# Patient Record
Sex: Male | Born: 1950 | Race: White | Hispanic: No | Marital: Married | Smoking: Former smoker
Health system: Southern US, Community
[De-identification: ages and names within clinical notes are randomized; demographics above are authoritative.]

## PROBLEM LIST (undated history)

## (undated) DIAGNOSIS — E785 Hyperlipidemia, unspecified: Secondary | ICD-10-CM

## (undated) DIAGNOSIS — Z9289 Personal history of other medical treatment: Secondary | ICD-10-CM

## (undated) DIAGNOSIS — E349 Endocrine disorder, unspecified: Secondary | ICD-10-CM

## (undated) DIAGNOSIS — I2510 Atherosclerotic heart disease of native coronary artery without angina pectoris: Secondary | ICD-10-CM

## (undated) DIAGNOSIS — I10 Essential (primary) hypertension: Secondary | ICD-10-CM

## (undated) HISTORY — DX: Hyperlipidemia, unspecified: E78.5

## (undated) HISTORY — DX: Atherosclerotic heart disease of native coronary artery without angina pectoris: I25.10

## (undated) HISTORY — DX: Endocrine disorder, unspecified: E34.9

## (undated) HISTORY — PX: CARDIAC SURGERY: SHX584

## (undated) HISTORY — DX: Personal history of other medical treatment: Z92.89

## (undated) HISTORY — DX: Essential (primary) hypertension: I10

## (undated) HISTORY — PX: HERNIA REPAIR: SHX51

---

## 2006-08-14 ENCOUNTER — Ambulatory Visit: Payer: Self-pay | Admitting: Family Medicine

## 2006-08-18 ENCOUNTER — Ambulatory Visit: Payer: Self-pay | Admitting: Family Medicine

## 2006-08-18 LAB — CONVERTED CEMR LAB
ALT: 21 units/L (ref 0–40)
AST: 20 units/L (ref 0–37)
BUN: 14 mg/dL (ref 6–23)
CO2: 32 meq/L (ref 19–32)
Calcium: 9 mg/dL (ref 8.4–10.5)
Chloride: 104 meq/L (ref 96–112)
Chol/HDL Ratio, serum: 7.1
Cholesterol: 285 mg/dL (ref 0–200)
Creatinine, Ser: 1.2 mg/dL (ref 0.4–1.5)
GFR calc non Af Amer: 67 mL/min
Glomerular Filtration Rate, Af Am: 81 mL/min/{1.73_m2}
Glucose, Bld: 101 mg/dL — ABNORMAL HIGH (ref 70–99)
HDL: 40.1 mg/dL (ref >39.0)
LDL DIRECT: 208.4 mg/dL
Potassium: 4.4 meq/L (ref 3.5–5.1)
Sodium: 142 meq/L (ref 135–145)
Triglyceride fasting, serum: 199 mg/dL — ABNORMAL HIGH (ref 0–149)
VLDL: 40 mg/dL (ref 0–40)

## 2006-09-29 ENCOUNTER — Ambulatory Visit (HOSPITAL_COMMUNITY): Admission: RE | Admit: 2006-09-29 | Discharge: 2006-09-29 | Payer: Self-pay | Admitting: General Surgery

## 2006-10-13 HISTORY — PX: CORONARY ARTERY BYPASS GRAFT: SHX141

## 2006-12-17 ENCOUNTER — Ambulatory Visit: Payer: Self-pay | Admitting: Family Medicine

## 2007-01-25 IMAGING — CR DG CHEST 2V
2 series · 2 of 2 positions shown · non-contrast
Comparison: none

CLINICAL DATA: Right inguinal hernia; preoperative chest.
 CHEST - 2 VIEW:

[view not recorded (1 of 2)]
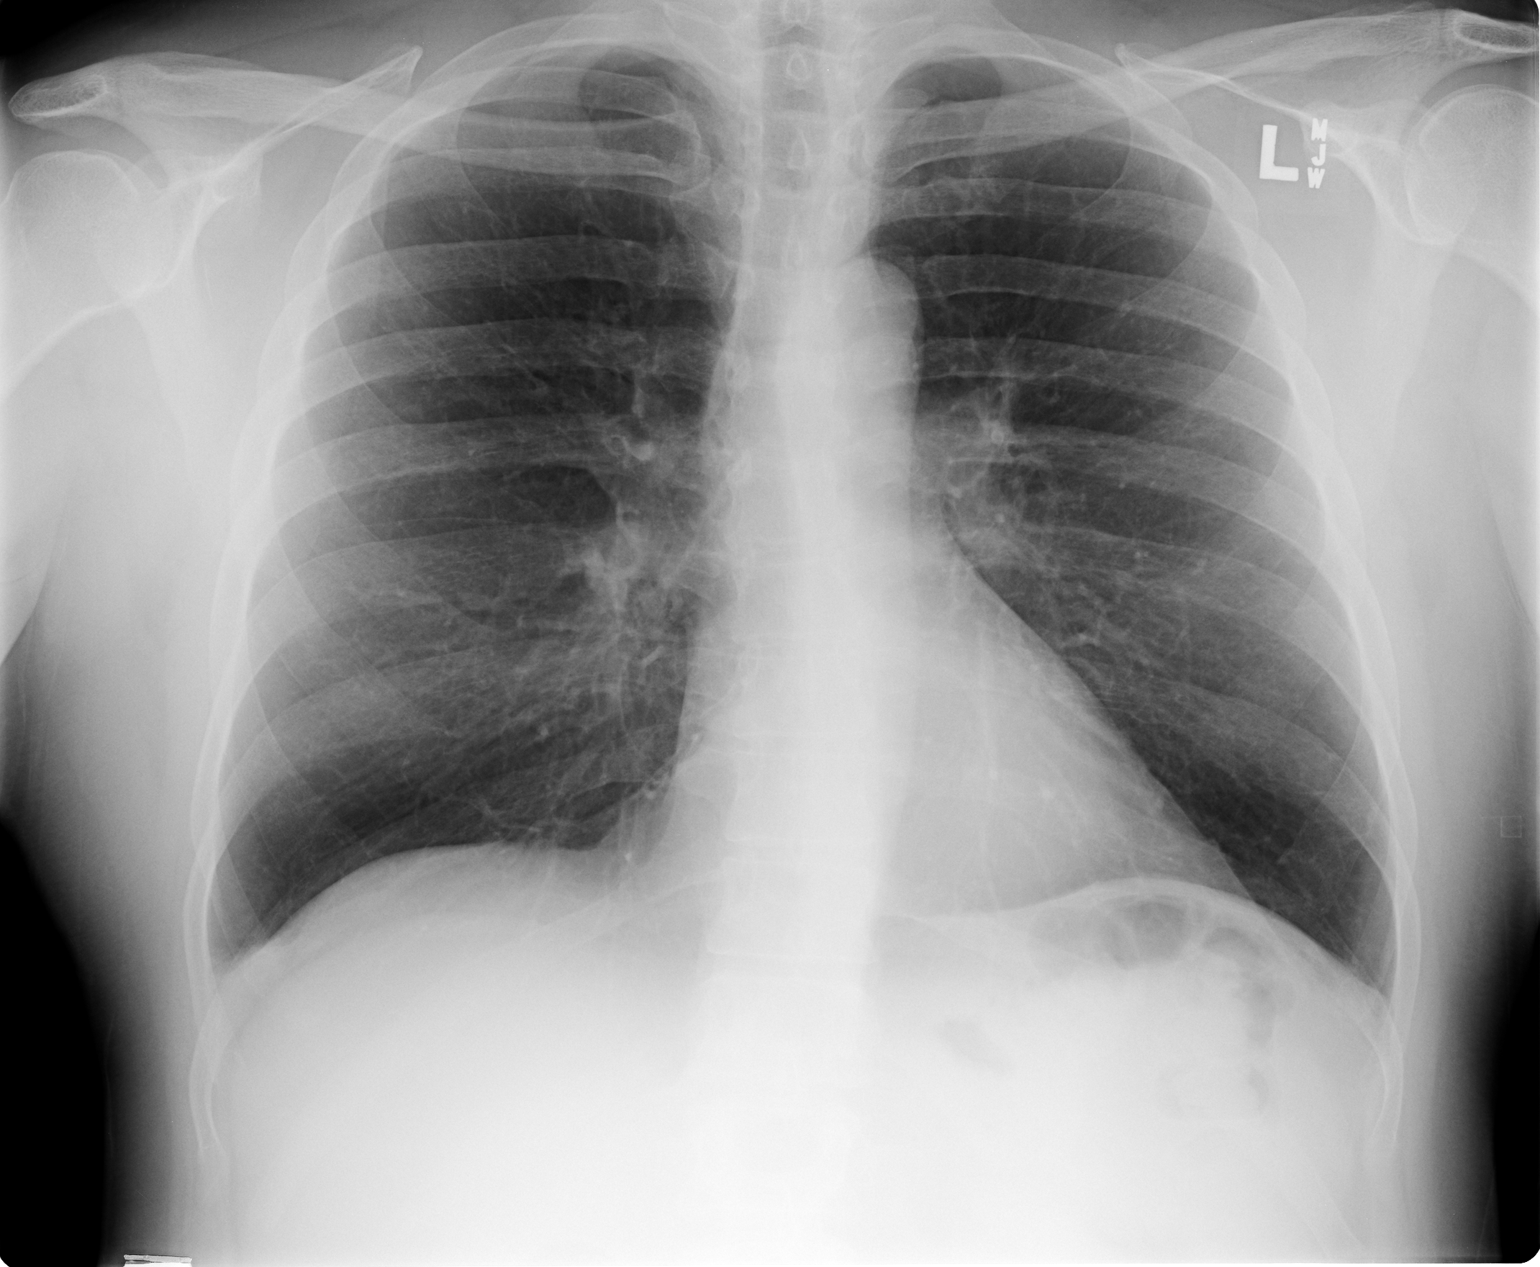

[view not recorded (2 of 2)]
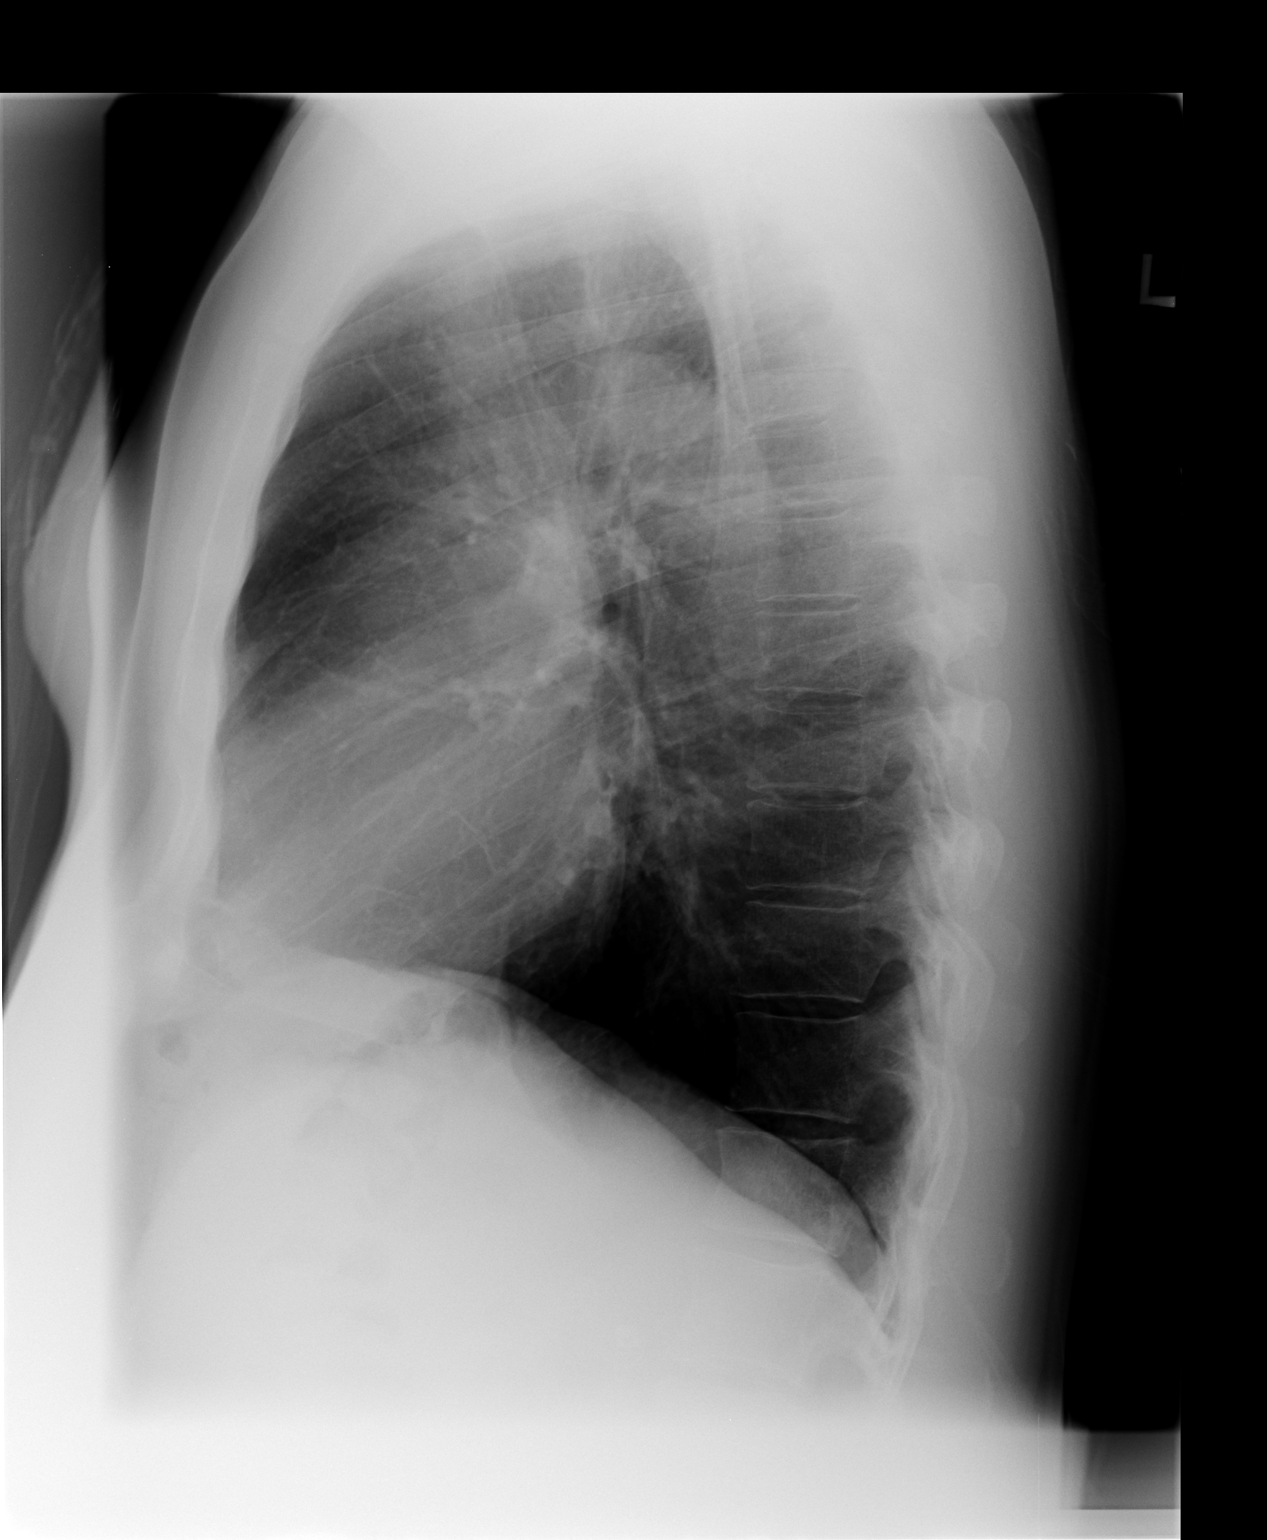

[2 of 2 positions shown; findings below may reference images not displayed]

FINDINGS: The lungs are clear and the heart and mediastinal structures are normal.
IMPRESSION: No evidence for active chest disease.

## 2007-05-23 ENCOUNTER — Ambulatory Visit: Payer: Self-pay | Admitting: Internal Medicine

## 2007-05-23 ENCOUNTER — Inpatient Hospital Stay (HOSPITAL_COMMUNITY): Admission: EM | Admit: 2007-05-23 | Discharge: 2007-06-01 | Payer: Self-pay | Admitting: Emergency Medicine

## 2007-05-24 ENCOUNTER — Ambulatory Visit: Payer: Self-pay | Admitting: Cardiothoracic Surgery

## 2007-05-25 ENCOUNTER — Encounter (INDEPENDENT_AMBULATORY_CARE_PROVIDER_SITE_OTHER): Payer: Self-pay | Admitting: Family Medicine

## 2007-05-25 ENCOUNTER — Encounter: Payer: Self-pay | Admitting: Cardiothoracic Surgery

## 2007-06-07 ENCOUNTER — Telehealth (INDEPENDENT_AMBULATORY_CARE_PROVIDER_SITE_OTHER): Payer: Self-pay | Admitting: *Deleted

## 2007-06-09 ENCOUNTER — Encounter (INDEPENDENT_AMBULATORY_CARE_PROVIDER_SITE_OTHER): Payer: Self-pay | Admitting: *Deleted

## 2007-06-18 ENCOUNTER — Ambulatory Visit: Payer: Self-pay | Admitting: Cardiothoracic Surgery

## 2007-06-18 ENCOUNTER — Encounter: Admission: RE | Admit: 2007-06-18 | Discharge: 2007-06-18 | Payer: Self-pay | Admitting: Cardiothoracic Surgery

## 2007-06-18 ENCOUNTER — Encounter (INDEPENDENT_AMBULATORY_CARE_PROVIDER_SITE_OTHER): Payer: Self-pay | Admitting: Family Medicine

## 2007-06-23 ENCOUNTER — Ambulatory Visit: Payer: Self-pay | Admitting: Cardiology

## 2007-06-23 LAB — CONVERTED CEMR LAB
BUN: 18 mg/dL (ref 6–23)
Basophils Absolute: 0.1 10*3/uL (ref 0.0–0.1)
Basophils Relative: 0.8 % (ref 0.0–1.0)
CO2: 30 meq/L (ref 19–32)
Calcium: 9.1 mg/dL (ref 8.4–10.5)
Chloride: 106 meq/L (ref 96–112)
Creatinine, Ser: 1 mg/dL (ref 0.4–1.5)
Eosinophils Absolute: 0.3 10*3/uL (ref 0.0–0.6)
Eosinophils Relative: 4.4 % (ref 0.0–5.0)
GFR calc Af Amer: 100 mL/min
GFR calc non Af Amer: 83 mL/min
Glucose, Bld: 101 mg/dL — ABNORMAL HIGH (ref 70–99)
HCT: 34 % — ABNORMAL LOW (ref 39.0–52.0)
Hemoglobin: 11.4 g/dL — ABNORMAL LOW (ref 13.0–17.0)
Lymphocytes Relative: 22.5 % (ref 12.0–46.0)
MCHC: 33.7 g/dL (ref 30.0–36.0)
MCV: 86.2 fL (ref 78.0–100.0)
Monocytes Absolute: 0.4 10*3/uL (ref 0.2–0.7)
Monocytes Relative: 5.8 % (ref 3.0–11.0)
Neutro Abs: 4.2 10*3/uL (ref 1.4–7.7)
Neutrophils Relative %: 66.5 % (ref 43.0–77.0)
Platelets: 298 10*3/uL (ref 150–400)
Potassium: 4.7 meq/L (ref 3.5–5.1)
RBC: 3.94 M/uL — ABNORMAL LOW (ref 4.22–5.81)
RDW: 14.1 % (ref 11.5–14.6)
Sodium: 142 meq/L (ref 135–145)
WBC: 6.4 10*3/uL (ref 4.5–10.5)

## 2007-07-01 ENCOUNTER — Encounter (HOSPITAL_COMMUNITY): Admission: RE | Admit: 2007-07-01 | Discharge: 2007-09-29 | Payer: Self-pay | Admitting: Cardiovascular Disease

## 2007-08-06 ENCOUNTER — Ambulatory Visit: Payer: Self-pay | Admitting: Cardiovascular Disease

## 2007-08-09 ENCOUNTER — Ambulatory Visit: Payer: Self-pay | Admitting: Cardiovascular Disease

## 2007-08-09 LAB — CONVERTED CEMR LAB
ALT: 26 units/L (ref 0–53)
AST: 18 units/L (ref 0–37)
Albumin: 3.7 g/dL (ref 3.5–5.2)
Alkaline Phosphatase: 87 units/L (ref 39–117)
Bilirubin, Direct: 0.1 mg/dL (ref 0.0–0.3)
Cholesterol: 178 mg/dL (ref 0–200)
HDL: 44.1 mg/dL (ref >39.0)
LDL Cholesterol: 95 mg/dL (ref 0–99)
Total Bilirubin: 0.8 mg/dL (ref 0.3–1.2)
Total CHOL/HDL Ratio: 4
Total Protein: 6.7 g/dL (ref 6.0–8.3)
Triglycerides: 196 mg/dL — ABNORMAL HIGH (ref 0–149)
VLDL: 39 mg/dL (ref 0–40)

## 2008-02-21 ENCOUNTER — Ambulatory Visit: Payer: Self-pay | Admitting: Cardiovascular Disease

## 2008-02-28 ENCOUNTER — Ambulatory Visit: Payer: Self-pay | Admitting: Cardiovascular Disease

## 2008-02-28 ENCOUNTER — Encounter: Payer: Self-pay | Admitting: Endocrinology

## 2008-02-28 ENCOUNTER — Ambulatory Visit: Payer: Self-pay

## 2008-03-08 ENCOUNTER — Ambulatory Visit: Payer: Self-pay | Admitting: Cardiovascular Disease

## 2008-03-08 LAB — CONVERTED CEMR LAB
ALT: 22 units/L (ref 0–53)
AST: 21 units/L (ref 0–37)
Albumin: 4.2 g/dL (ref 3.5–5.2)
Alkaline Phosphatase: 104 units/L (ref 39–117)
BUN: 21 mg/dL (ref 6–23)
Bilirubin, Direct: 0.1 mg/dL (ref 0.0–0.3)
CO2: 27 meq/L (ref 19–32)
Calcium: 9.6 mg/dL (ref 8.4–10.5)
Chloride: 103 meq/L (ref 96–112)
Cholesterol: 195 mg/dL (ref 0–200)
Creatinine, Ser: 1 mg/dL (ref 0.4–1.5)
GFR calc Af Amer: 100 mL/min
GFR calc non Af Amer: 82 mL/min
Glucose, Bld: 101 mg/dL — ABNORMAL HIGH (ref 70–99)
HDL: 36.8 mg/dL — ABNORMAL LOW (ref >39.0)
LDL Cholesterol: 130 mg/dL — ABNORMAL HIGH (ref 0–99)
Potassium: 3.7 meq/L (ref 3.5–5.1)
Sodium: 142 meq/L (ref 135–145)
Total Bilirubin: 0.8 mg/dL (ref 0.3–1.2)
Total CHOL/HDL Ratio: 5.3
Total Protein: 7.1 g/dL (ref 6.0–8.3)
Triglycerides: 142 mg/dL (ref 0–149)
VLDL: 28 mg/dL (ref 0–40)

## 2008-03-29 ENCOUNTER — Ambulatory Visit: Payer: Self-pay | Admitting: Cardiovascular Disease

## 2008-05-04 ENCOUNTER — Encounter: Admission: RE | Admit: 2008-05-04 | Discharge: 2008-07-04 | Payer: Self-pay | Admitting: Pediatrics

## 2008-06-26 ENCOUNTER — Ambulatory Visit: Payer: Self-pay | Admitting: Cardiovascular Disease

## 2008-07-24 ENCOUNTER — Ambulatory Visit: Payer: Self-pay | Admitting: Internal Medicine

## 2008-07-24 DIAGNOSIS — I10 Essential (primary) hypertension: Secondary | ICD-10-CM | POA: Insufficient documentation

## 2008-07-24 DIAGNOSIS — E785 Hyperlipidemia, unspecified: Secondary | ICD-10-CM | POA: Insufficient documentation

## 2008-07-24 DIAGNOSIS — J069 Acute upper respiratory infection, unspecified: Secondary | ICD-10-CM | POA: Insufficient documentation

## 2008-07-24 DIAGNOSIS — J4521 Mild intermittent asthma with (acute) exacerbation: Secondary | ICD-10-CM | POA: Insufficient documentation

## 2008-08-14 ENCOUNTER — Ambulatory Visit: Payer: Self-pay | Admitting: Cardiovascular Disease

## 2008-08-14 LAB — CONVERTED CEMR LAB
ALT: 26 units/L (ref 0–53)
AST: 21 units/L (ref 0–37)
Albumin: 3.8 g/dL (ref 3.5–5.2)
Alkaline Phosphatase: 82 units/L (ref 39–117)
Bilirubin, Direct: 0.1 mg/dL (ref 0.0–0.3)
Cholesterol: 164 mg/dL (ref 0–200)
HDL: 42.1 mg/dL (ref >39.0)
LDL Cholesterol: 95 mg/dL (ref 0–99)
Total Bilirubin: 0.8 mg/dL (ref 0.3–1.2)
Total CHOL/HDL Ratio: 3.9
Total Protein: 6.6 g/dL (ref 6.0–8.3)
Triglycerides: 133 mg/dL (ref 0–149)
VLDL: 27 mg/dL (ref 0–40)

## 2008-09-05 ENCOUNTER — Telehealth (INDEPENDENT_AMBULATORY_CARE_PROVIDER_SITE_OTHER): Payer: Self-pay | Admitting: *Deleted

## 2008-09-06 ENCOUNTER — Ambulatory Visit: Payer: Self-pay | Admitting: Family Medicine

## 2008-09-22 ENCOUNTER — Ambulatory Visit: Payer: Self-pay | Admitting: Family Medicine

## 2008-09-27 ENCOUNTER — Ambulatory Visit: Payer: Self-pay | Admitting: Family Medicine

## 2008-09-27 ENCOUNTER — Telehealth (INDEPENDENT_AMBULATORY_CARE_PROVIDER_SITE_OTHER): Payer: Self-pay | Admitting: *Deleted

## 2008-10-19 ENCOUNTER — Ambulatory Visit: Payer: Self-pay | Admitting: Cardiovascular Disease

## 2008-11-03 ENCOUNTER — Ambulatory Visit: Payer: Self-pay | Admitting: Cardiovascular Disease

## 2008-11-07 ENCOUNTER — Ambulatory Visit: Payer: Self-pay

## 2008-11-07 ENCOUNTER — Encounter: Payer: Self-pay | Admitting: Family Medicine

## 2009-02-20 ENCOUNTER — Ambulatory Visit: Payer: Self-pay | Admitting: Cardiovascular Disease

## 2009-02-21 ENCOUNTER — Encounter: Payer: Self-pay | Admitting: Internal Medicine

## 2009-03-30 ENCOUNTER — Encounter (INDEPENDENT_AMBULATORY_CARE_PROVIDER_SITE_OTHER): Payer: Self-pay | Admitting: *Deleted

## 2009-07-27 ENCOUNTER — Ambulatory Visit: Payer: Self-pay | Admitting: Family Medicine

## 2009-07-27 DIAGNOSIS — R222 Localized swelling, mass and lump, trunk: Secondary | ICD-10-CM | POA: Insufficient documentation

## 2009-08-02 ENCOUNTER — Encounter: Admission: RE | Admit: 2009-08-02 | Discharge: 2009-08-02 | Payer: Self-pay | Admitting: Family Medicine

## 2009-08-03 ENCOUNTER — Telehealth (INDEPENDENT_AMBULATORY_CARE_PROVIDER_SITE_OTHER): Payer: Self-pay | Admitting: *Deleted

## 2009-08-03 DIAGNOSIS — I2510 Atherosclerotic heart disease of native coronary artery without angina pectoris: Secondary | ICD-10-CM | POA: Insufficient documentation

## 2009-08-03 DIAGNOSIS — I25110 Atherosclerotic heart disease of native coronary artery with unstable angina pectoris: Secondary | ICD-10-CM | POA: Insufficient documentation

## 2009-08-06 ENCOUNTER — Ambulatory Visit: Payer: Self-pay | Admitting: Cardiovascular Disease

## 2009-08-17 ENCOUNTER — Ambulatory Visit: Payer: Self-pay | Admitting: Cardiovascular Disease

## 2009-08-23 ENCOUNTER — Encounter (INDEPENDENT_AMBULATORY_CARE_PROVIDER_SITE_OTHER): Payer: Self-pay | Admitting: *Deleted

## 2009-08-24 ENCOUNTER — Encounter (INDEPENDENT_AMBULATORY_CARE_PROVIDER_SITE_OTHER): Payer: Self-pay | Admitting: *Deleted

## 2010-01-11 ENCOUNTER — Telehealth (INDEPENDENT_AMBULATORY_CARE_PROVIDER_SITE_OTHER): Payer: Self-pay | Admitting: *Deleted

## 2010-03-01 ENCOUNTER — Encounter: Payer: Self-pay | Admitting: Cardiovascular Disease

## 2010-03-01 ENCOUNTER — Encounter: Payer: Self-pay | Admitting: Endocrinology

## 2010-06-04 ENCOUNTER — Encounter: Payer: Self-pay | Admitting: Cardiovascular Disease

## 2010-06-05 ENCOUNTER — Ambulatory Visit: Payer: Self-pay | Admitting: Cardiovascular Disease

## 2010-06-05 ENCOUNTER — Ambulatory Visit: Payer: Self-pay

## 2010-06-12 ENCOUNTER — Encounter: Payer: Self-pay | Admitting: Internal Medicine

## 2010-06-12 ENCOUNTER — Ambulatory Visit: Payer: Self-pay | Admitting: Cardiovascular Disease

## 2010-07-08 ENCOUNTER — Ambulatory Visit: Payer: Self-pay | Admitting: Cardiovascular Disease

## 2010-09-30 ENCOUNTER — Ambulatory Visit: Payer: Self-pay | Admitting: Cardiovascular Disease

## 2010-11-12 NOTE — Letter (Signed)
Summary: Results Follow-up Letter  [facility] [place]  [address] [place], [place] [address]   Phone: [phone]  Fax: [phone]    06/09/2007         [facility] [address] [place], [place]  [address]  Dear Kevin Guzman,   The following are the results of your recent test(s):  Test     Result     Pap Smear    Normal_______  Not Normal_____       Comments: _________________________________________________________ Cholesterol LDL(Bad cholesterol):          Your goal is less than:         HDL (Good cholesterol):        Your goal is more than: _________________________________________________________ Other Tests:   _________________________________________________________  Please call for an appointment Or _Please see attached.________________________________________________________ _________________________________________________________ _________________________________________________________  Sincerely,  Ardyth Man Corson at [facility]

## 2010-11-12 NOTE — Miscellaneous (Signed)
Summary: Orders Update  Clinical Lists Changes  Orders: Added new Test order of Arterial Duplex Lower Extremity (Arterial Duplex Low) - Signed 

## 2010-11-12 NOTE — Assessment & Plan Note (Addendum)
Summary: [name]   Visit Type:  Follow-up Primary Provider:  Dr Dutch Gray  CC:  fatigue.  History of Present Illness: This is a 60 year old gentleman with CAD status post CABG presenting today for follow up evaluation. He presented in 2008 with a non-ST elevation MI and was treated with coronary bypass surgery for severe multivessel disease.  He complains of generalized fatigue...feels like he's 'in a rut.' He's had difficulty finding a job, despite continued search.  He denies chest pain or dyspnea. No edema, orthopnea, or PND. No palpitations.  Current Medications (verified): 1)  Metoprolol Tartrate 25 Mg Tabs (Metoprolol Tartrate) .Marland Kitchen.. 1 By Mouth Two Times A Day 2)  Lipitor 80 Mg Tabs (Atorvastatin Calcium) .Marland Kitchen.. 1 By Mouth Once Daily 3)  Aspirin 81 Mg Tbec (Aspirin) 4)  Ventolin Hfa 108 (90 Base) Mcg/act Aers (Albuterol Sulfate) .... As Needed 5)  Hydrochlorothiazide 12.5 Mg Tabs (Hydrochlorothiazide) .... Take One Tablet By Mouth Daily. 6)  Melatonin 3 Mg Tabs (Melatonin) .... 3 To 5mg  As Needed  Allergies (verified): No Known Drug Allergies  Past History:  Past medical history reviewed for relevance to current acute and chronic problems.  Past Medical History: Reviewed history from 08/06/2009 and no changes required. Current Problems:  CAD, ARTERY BYPASS GRAFT (ICD-414.04)-Severe multivessel disease, 2008 HYPERTENSION (ICD-401.9) HYPERLIPIDEMIA (ICD-272.4) MASS, CHEST WALL (ICD-786.6) URI (ICD-465.9) ASTHMA (ICD-493.90)-    Review of Systems       Positive for depression, otherwise negative except as per HPI.  Vital Signs:  Patient profile:   60 year old male Height:      64 inches Weight:      200.50 pounds BMI:     34.54 Pulse rate:   71 / minute Pulse rhythm:   regular Resp:     18 per minute BP sitting:   124 / 82  (left arm) Cuff size:   regular  Vitals Entered By: Vikki Ports (July 08, 2010 2:32 PM)  Physical Exam  General:  Pt is alert and  oriented, in no acute distress. HEENT: normal Neck: normal carotid upstrokes without bruits, JVP normal Lungs: CTA CV: RRR without murmur or gallop Abd: soft, NT, positive BS, no bruit, no organomegaly Ext: no clubbing, cyanosis, or edema. peripheral pulses 2+ and equal Skin: warm and dry without rash    EKG  Procedure date:  07/08/2010  Findings:      NSR 72 bpm, nonspecific T wave abnormality, otherwise within normal limits.  Impression & Recommendations:  Problem # 1:  CAD, ARTERY BYPASS GRAFT (ICD-414.04)  Stable without angina. Continue current medical program without changes.  His updated medication list for this problem includes:    Metoprolol Tartrate 25 Mg Tabs (Metoprolol tartrate) .Marland Kitchen... 1 by mouth two times a day    Aspirin 81 Mg Tbec (Aspirin)  Orders: EKG w/ Interpretation (93[address])  Problem # 2:  HYPERTENSION (ICD-401.9) Controlled.  His updated medication list for this problem includes:    Metoprolol Tartrate 25 Mg Tabs (Metoprolol tartrate) .Marland Kitchen... 1 by mouth two times a day    Aspirin 81 Mg Tbec (Aspirin)    Hydrochlorothiazide 12.5 Mg Tabs (Hydrochlorothiazide) .Marland Kitchen... Take one tablet by mouth daily.  Orders: EKG w/ Interpretation ([address])  BP today: 124/82 Prior BP: 122/82 (08/06/2009)  Labs Reviewed: K+: 3.7 (03/08/2008) Creat: : 1.0 (03/08/2008)   Chol: 164 (08/14/2008)   HDL: 42.1 (08/14/2008)   LDL: 95 (08/14/2008)   TG: 133 (08/14/2008)  Problem # 3:  HYPERLIPIDEMIA (ICD-272.4) He has been inconsistent with  lipitor. Last LDL May 2011 drawn through clinical trial was 144 mg/dL, total chol 161 mg/dL. He was advised of the importance of med adherence and TLC. Will continue lipitor 80 mg daily and followup lab sin 3 months.  His updated medication list for this problem includes:    Lipitor 80 Mg Tabs (Atorvastatin calcium) .Marland Kitchen... 1 by mouth once daily  Orders: EKG w/ Interpretation (93[address])  CHOL: 164 (08/14/2008)   LDL: 95 (08/14/2008)   HDL: 42.1  (08/14/2008)   TG: 133 (08/14/2008)  Patient Instructions: 1)  Your physician recommends that you return for a FASTING Lipid and Liver Profile in 3 MONTHS (414.02, 272.0)--Nothing to eat or drink after midnight, the lab opens at 8:30--due the week of 09/30/10 2)  Your physician recommends that you continue on your current medications as directed. Please refer to the Current Medication list given to you today. 3)  Your physician wants you to follow-up in:  6 MONTHS.  You will receive a reminder letter in the mail two months in advance. If you don't receive a letter, please call our office to schedule the follow-up appointment.

## 2010-11-12 NOTE — Miscellaneous (Signed)
Summary: study update   Clinical Lists Changes  Medications: Changed medication from * TRA2P STUDY DRUG Take one tab once daily to * TRA2P STUDY DRUG Take one tab once daily 

## 2010-11-12 NOTE — Assessment & Plan Note (Signed)
Summary: knot on chest for about three weeks/kdc   Vital Signs:  Patient profile:   60 year old male Weight:      194.2 pounds Pulse rate:   64 / minute BP sitting:   106 / 66  (left arm)  Vitals Entered By: Doristine Devoid (July 27, 2009 11:50 AM) CC: knot on chest x1 month not painful   History of Present Illness: 60 yo man here today for a 'knot' in the center of his chest.  1st noticed 1 month ago.  not painful.  located around bypass scar.  no drainage.  no redness.  no similar areas elsewhere.    Current Medications (verified): 1)  Metoprolol Tartrate 25 Mg Tabs (Metoprolol Tartrate) .Marland Kitchen.. 1 By Mouth Two Times A Day 2)  Lipitor 80 Mg Tabs (Atorvastatin Calcium) .Marland Kitchen.. 1 By Mouth Once Daily 3)  Study Drug (Cardiology) 4)  Aspirin 81 Mg Tbec (Aspirin) 5)  Astepro 137 Mcg/spray Soln (Azelastine Hcl) .... 2 Puff On Each Side of The Nose Two Times A Day 6)  Guaifenesin-Codeine 100-10 Mg/28ml Liqd (Guaifenesin-Codeine) .... 5 To 10 Cc  By Mouth At Bedtime As Needed For   Cough 7)  Ventolin Hfa 108 (90 Base) Mcg/act Aers (Albuterol Sulfate) .... 2 Puffs Q4 As Needed For Wheezing 8)  Prednisone 10 Mg  Tabs (Prednisone) .... 4 Tabs By Mouth X 3 Days and Then 3 Tabs X 3 Days and Then 2 Tabs X 3 Days and Then 1 Tab X 3 Days. 9)  Hydrochlorothiazide 12.5 Mg Tabs (Hydrochlorothiazide) .... Take One Tablet By Mouth Daily.  Allergies (verified): No Known Drug Allergies  Past History:  Past Medical History: Last updated: 07/24/2008 Coronary artery disease, CABG 2008 Hyperlipidemia Hypertension Cough Variant asthma (per patient)    Review of Systems      See HPI  Physical Exam  General:  alert and well-developed.   Chest Wall:  2cm x 3cm soft tissue mass to R of sternum, most consistent w/ lipoma.  no overlying redness, warmth, induration, or drainage.   Impression & Recommendations:  Problem # 1:  MASS, CHEST WALL (ICD-786.6) Assessment New  area most consistent w/ lipoma but will get [place] to assess.  reviewed supportive care and red flags that should prompt return.  Pt expresses understanding and is in agreement w/ this plan. Orders: Radiology Referral (Radiology)  Complete Medication List: 1)  Metoprolol Tartrate 25 Mg Tabs (Metoprolol tartrate) .Marland Kitchen.. 1 by mouth two times a day 2)  Lipitor 80 Mg Tabs (Atorvastatin calcium) .Marland Kitchen.. 1 by mouth once daily 3)  Study Drug (cardiology)  4)  Aspirin 81 Mg Tbec (Aspirin) 5)  Astepro 137 Mcg/spray Soln (Azelastine hcl) .... 2 puff on each side of the nose two times a day 6)  Guaifenesin-codeine 100-10 Mg/59ml Liqd (Guaifenesin-codeine) .... 5 to 10 cc  by mouth at bedtime as needed for   cough 7)  Ventolin Hfa 108 (90 Base) Mcg/act Aers (Albuterol sulfate) .... 2 puffs q4 as needed for wheezing 8)  Prednisone 10 Mg Tabs (Prednisone) .... 4 tabs by mouth x 3 days and then 3 tabs x 3 days and then 2 tabs x 3 days and then 1 tab x 3 days. 9)  Hydrochlorothiazide 12.5 Mg Tabs (Hydrochlorothiazide) .... Take one tablet by mouth daily.  Patient Instructions: 1)  Please schedule your complete physical in the next 4-6 weeks- do not eat before this appt 2)  Someone will call you with your ultrasound appt 3)  If  you develop pain, redness, or other concerns- please call 4)  Welcome Back!

## 2010-11-12 NOTE — Progress Notes (Signed)
  Phone Note Outgoing Call Call back at [facility] Phone [phone]   Call placed by: Ardyth Man,  June 07, 2007 9:11 AM Call placed to: Patient Summary of Call: [facility] ...................................................................Ardyth Man  June 07, 2007 9:11 AM   Follow-up for Phone Call        [facility] ...................................................................Ardyth Man  June 09, 2007 3:12 PM Also, mailed lab letter ...................................................................Ardyth Man  June 09, 2007 3:12 PM  Follow-up by: Ardyth Man,  June 09, 2007 3:13 PM

## 2010-11-12 NOTE — Assessment & Plan Note (Signed)
Summary: not better from 11.25.09--acute only--tl   Vital Signs:  Patient Profile:   60 Years Old Male Weight:      187.4 pounds O2 Sat:      90 % Temp:     98.1 degrees F oral Pulse rate:   82 / minute BP sitting:   110 / 80  (left arm)  Vitals Entered By: Doristine Devoid (September 22, 2008 11:56 AM)                 Chief Complaint:  cough and congestion not better productive w/ brownish mucous is unable to sleep due to wheezing at night .  History of Present Illness: 60 yo man w/ chest congestion, wheezing and difficulty sleeping at night.  4-5 days of sxs.  no fevers.  cough productive of brown/white sputum.  + SOB.  Using albuterol inhaler q4 hours.  previously used bactrim for bronchitis.  denies nasal congestion, ear pain.    Current Allergies (reviewed today): No known allergies      Review of Systems      See HPI   Physical Exam  General:     alert and well-developed.   Head:     no tenderness to palpation of maxillary and frontal sinus area   Eyes:     no injxn or inflammation Ears:     R ear normal and L ear normal.   Nose:     (+)  congestion Mouth:     no erythema and no exudates.   Neck:     No deformities, masses, or tenderness noted. Lungs:     scattered inspiratory and expiratory wheezes Heart:     normal rate, regular rhythm, and no murmur.      Impression & Recommendations:  Problem # 1:  BRONCHITIS-ACUTE (ICD-466.0) Assessment: New Pt w/ bronchitis- former smoker, current asthmatic.  Pt reports Bactrim worked well in past for bronichitis- will treat.  Codeine cough syrup for bed.  Reviewed red flags that should prompt medical attention.  Pt expresses understanding and is in agreement w/ this plan. His updated medication list for this problem includes:    Guaifenesin-codeine 100-10 Mg/29ml Liqd (Guaifenesin-codeine) .Marland KitchenMarland KitchenMarland KitchenMarland Kitchen 5 to 10 cc  by mouth at bedtime as needed for   cough     Bactrim Ds 800-160 Mg Tabs (Sulfamethoxazole-trimethoprim) .Marland Kitchen... 1 tab by mouth two times a day x 5 days    Ventolin Hfa 108 (90 Base) Mcg/act Aers (Albuterol sulfate) .Marland Kitchen... 2 puffs q4 as needed for wheezing   Complete Medication List: 1)  Metoprolol Tartrate 25 Mg Tabs (Metoprolol tartrate) .Marland Kitchen.. 1 by mouth two times a day 2)  Diuretic ?name  3)  Lipitor 80 Mg Tabs (Atorvastatin calcium) .Marland Kitchen.. 1 by mouth once daily 4)  Study Drug (cardiology)  5)  Aspirin 81 Mg Tbec (Aspirin) 6)  Astepro 137 Mcg/spray Soln (Azelastine hcl) .... 2 puff on each side of the nose two times a day 7)  Guaifenesin-codeine 100-10 Mg/56ml Liqd (Guaifenesin-codeine) .... 5 to 10 cc  by mouth at bedtime as needed for   cough 8)  Bactrim Ds 800-160 Mg Tabs (Sulfamethoxazole-trimethoprim) .Marland Kitchen.. 1 tab by mouth two times a day x 5 days 9)  Ventolin Hfa 108 (90 Base) Mcg/act Aers (Albuterol sulfate) .... 2 puffs q4 as needed for wheezing   Patient Instructions: 1)  Follow up as needed 2)  Take the Bactrim as directed 3)  If worsening wheezing, shortness of breath, or other concerns- please call or  go to the ER 4)  We can call in prednisone for you if your wheezing worsens 5)  Call with any questions or concerns 6)  Have a great holiday season!   Prescriptions: VENTOLIN HFA 108 (90 BASE) MCG/ACT AERS (ALBUTEROL SULFATE) 2 puffs q4 as needed for wheezing  #1 x 3   Entered and Authorized by:   Neena Rhymes MD   Signed by:   Neena Rhymes MD on 09/22/2008   Method used:   Electronically to        [facility] [address] [phone]* (retail)       [address]       [place], [place]  [address]       Ph: [phone]       Fax: [phone]   RxID:   [id number] BACTRIM DS 800-160 MG TABS (SULFAMETHOXAZOLE-TRIMETHOPRIM) 1 tab by mouth two times a day x 5 days  #10 x 0   Entered and Authorized by:   Neena Rhymes MD   Signed by:   Neena Rhymes MD on 09/22/2008    Method used:   Electronically to        [facility] [address] #[id number]* (retail)       [address]       [place], [place]  [address]       Ph: [phone]       Fax: [phone]   RxID:   [id number]  ]

## 2010-11-12 NOTE — Miscellaneous (Signed)
Summary: study update   Clinical Lists Changes 

## 2010-11-12 NOTE — Assessment & Plan Note (Signed)
Summary: rov  Medications Added VENTOLIN HFA 108 (90 BASE) MCG/ACT AERS (ALBUTEROL SULFATE) as needed      Allergies Added: NKDA  Visit Type:  Follow-up Primary Provider:  Dr Beverely Low  CC:   Sob once in awhile.  History of Present Illness: This is a 60 year old gentleman with CAD status post CABG presenting today for follow up evaluation. He presented in 2008 with a non-ST elevation MI and was treated with coronary bypass surgery for severe multivessel disease.  The patient is not engaged in exercise. He complains of generalized fatigue. Denies chest pain, dyspnea, orthopnea, PND, or edema. No palps, lightheadedness, or fatigue.  He has no complaints today.  Current Medications (verified): 1)  Metoprolol Tartrate 25 Mg Tabs (Metoprolol Tartrate) .Marland Kitchen.. 1 By Mouth Two Times A Day 2)  Lipitor 80 Mg Tabs (Atorvastatin Calcium) .Marland Kitchen.. 1 By Mouth Once Daily 3)  Study Drug (Cardiology) 4)  Aspirin 81 Mg Tbec (Aspirin) 5)  Ventolin Hfa 108 (90 Base) Mcg/act Aers (Albuterol Sulfate) .... As Needed 6)  Hydrochlorothiazide 12.5 Mg Tabs (Hydrochlorothiazide) .... Take One Tablet By Mouth Daily.  Allergies (verified): No Known Drug Allergies  Past History:  Past medical history reviewed for relevance to current acute and chronic problems.  Past Medical History: Current Problems:  CAD, ARTERY BYPASS GRAFT (ICD-414.04)-Severe multivessel disease, 2008 HYPERTENSION (ICD-401.9) HYPERLIPIDEMIA (ICD-272.4) MASS, CHEST WALL (ICD-786.6) URI (ICD-465.9) ASTHMA (ICD-493.90)-    Review of Systems       Negative except as per HPI   Vital Signs:  Patient profile:   60 year old male Height:      64 inches Weight:      196 pounds BMI:     33.76 Pulse rate:   64 / minute Pulse rhythm:   regular Resp:     18 per minute BP sitting:   122 / 82  (left arm) Cuff size:   large  Vitals Entered By: Vikki Ports (August 06, 2009 10:31 AM)  Physical Exam   General:  Pt is alert and oriented, in no acute distress. HEENT: normal Neck: normal carotid upstrokes without bruits, JVP normal Lungs: CTA Chest: Midline sternotomy scar is well healed, the xiphoid process is prominent but nontender. There is no chest wall deformity appreciated. CV: RRR without murmur or gallop Abd: soft, NT, positive BS, no bruit, no organomegaly Ext: no clubbing, cyanosis, or edema. peripheral pulses 2+ and equal Skin: warm and dry without rash    EKG  Procedure date:  08/06/2009  Findings:      NSR, within normal limits.  Impression & Recommendations:  Problem # 1:  CAD, ARTERY BYPASS GRAFT (ICD-414.04) The patient is stable without angina. He underwent an exercise Myoview earlier this year that showed no ischemia. Recommend continue current medical therapy with aspirin, Toprol, and a statin with goal LDL less than 100.  His updated medication list for this problem includes:    Metoprolol Tartrate 25 Mg Tabs (Metoprolol tartrate) .Marland Kitchen... 1 by mouth two times a day    Aspirin 81 Mg Tbec (Aspirin)  Orders: EKG w/ Interpretation ([address]000)  Problem # 2:  HYPERLIPIDEMIA (ICD-272.4) The patient is on high-dose atorvastatin. His lipids are followed through the TRA2P study protocol. His updated medication list for this problem includes:    Lipitor 80 Mg Tabs (Atorvastatin calcium) .Marland Kitchen... 1 by mouth once daily  Orders: EKG w/ Interpretation (93000)  CHOL: 164 (08/14/2008)   LDL: 95 (08/14/2008)   HDL: 42.1 (08/14/2008)   TG: 133 (08/14/2008)  Problem # 3:  HYPERTENSION (ICD-401.9) BP stable. Encouraged exercise with a goal for weight loss. His updated medication list for this problem includes:    Metoprolol Tartrate 25 Mg Tabs (Metoprolol tartrate) .Marland Kitchen... 1 by mouth two times a day    Aspirin 81 Mg Tbec (Aspirin)    Hydrochlorothiazide 12.5 Mg Tabs (Hydrochlorothiazide) .Marland Kitchen... Take one tablet by mouth daily.  Orders: EKG w/ Interpretation (93000)   BP today: 122/82 Prior BP: 106/66 (07/27/2009)  Labs Reviewed: K+: 3.7 (03/08/2008) Creat: : 1.0 (03/08/2008)   Chol: 164 (08/14/2008)   HDL: 42.1 (08/14/2008)   LDL: 95 (08/14/2008)   TG: 133 (08/14/2008)  Patient Instructions: 1)  Your physician recommends that you continue on your current medications as directed. Please refer to the Current Medication list given to you today. 2)  Your physician wants you to follow-up in:  1 YEAR.  You will receive a reminder letter in the mail two months in advance. If you don't receive a letter, please call our office to schedule the follow-up appointment.

## 2010-11-12 NOTE — Miscellaneous (Signed)
Summary: study update   Clinical Lists Changes  Observations: Added new observation of RS STUDY: Tra2p (08/23/2009 10:42) Added new observation of RESEARCHCAND: Cardiology (08/23/2009 10:42)      Research Study Name: Kevin Guzman

## 2010-11-12 NOTE — Progress Notes (Signed)
Summary: pt needs lipitor asap  Phone Note Refill Request Message from:  Patient on [facility] [address]  Refills Requested: Medication #1:  LIPITOR 80 MG TABS 1 by mouth once daily pt wants to get a note from the MD so he can get this medication for $4.00  Initial call taken by: Omer Jack,  January 11, 2010 3:19 PM  Follow-up for Phone Call        Lipitor discount card placed at front desk . patient notified Follow-up by: Vikki Ports,  January 11, 2010 4:41 PM

## 2010-11-12 NOTE — Miscellaneous (Signed)
Summary: update med  Clinical Lists Changes  Medications: Removed medication of * DIURETIC ?NAME Added new medication of HYDROCHLOROTHIAZIDE 12.5 MG TABS (HYDROCHLOROTHIAZIDE) Take one tablet by mouth daily.

## 2010-11-12 NOTE — Assessment & Plan Note (Signed)
Summary: acute only congested.cbs   Vital Signs:  Patient Profile:   60 Years Old Male Weight:      185 pounds Temp:     97.5 degrees F BP sitting:   124 / 80  Vitals Entered By: Shary Decamp (July 24, 2008 10:26 AM)                 Chief Complaint:  HEAD CONGESTION X 3 DAYS; SNEEZING COUGHING and SINUS PRESSURE; USING OTC COLD MED.  History of Present Illness: sick x 1 week tired x 1 week chest, nasal and sinus congestion cough, can't sleep (+) nausea , one time, 6 days ago    Updated Prior Medication List: METOPROLOL TARTRATE 25 MG TABS (METOPROLOL TARTRATE) 1 by mouth two times a day * DIURETIC ?NAME  LIPITOR 80 MG TABS (ATORVASTATIN CALCIUM) 1 by mouth once daily * STUDY DRUG (CARDIOLOGY)  ASPIRIN 81 MG TBEC (ASPIRIN)   Current Allergies (reviewed today): No known allergies   Past Medical History:    Reviewed history and no changes required:       Coronary artery disease, CABG 2008       Hyperlipidemia       Hypertension       Cough Variant asthma (per patient)          Past Surgical History:    CABG August 2008   Social History:    Married   Risk Factors:  Tobacco use:  quit    Year quit:  2000 Alcohol use:  yes    Comments:  rarely   Review of Systems  General      low grade w/ onset of symptoms   Resp      Denies sputum productive and wheezing.      slightly  SOB   Physical Exam  General:     alert and well-developed.   Head:     no tender to palpation of maxilary and frontal sinus area   Ears:     R ear normal and L ear normal.   Nose:     (+)  congestion Mouth:     no erythema and no exudates.   Lungs:     normal respiratory effort, no intercostal retractions, no accessory muscle use, and normal breath sounds.   Heart:     normal rate, regular rhythm, and no murmur.      Impression & Recommendations:  Problem # 1:  URI (ICD-465.9) see instructions  His updated medication list for this problem includes:     Aspirin 81 Mg Tbec (Aspirin)    Guaifenesin-codeine 100-10 Mg/5ml Liqd (Guaifenesin-codeine) .Marland KitchenMarland KitchenMarland KitchenMarland Kitchen 5 to 10 cc  by mouth at bedtime as needed for   cough   Complete Medication List: 1)  Metoprolol Tartrate 25 Mg Tabs (Metoprolol tartrate) .Marland Kitchen.. 1 by mouth two times a day 2)  Diuretic ?name  3)  Lipitor 80 Mg Tabs (Atorvastatin calcium) .Marland Kitchen.. 1 by mouth once daily 4)  Study Drug (cardiology)  5)  Aspirin 81 Mg Tbec (Aspirin) 6)  Astepro 137 Mcg/spray Soln (Azelastine hcl) .... 2 puff on each side of the nose two times a day 7)  Guaifenesin-codeine 100-10 Mg/83ml Liqd (Guaifenesin-codeine) .... 5 to 10 cc  by mouth at bedtime as needed for   cough   Patient Instructions: 1)  Get plenty of rest, drink lots of clear liquids, and use Tylenol  2)  Mucinex DM for cough 3)  if cough persist: use codeine 4)  Astepro  for nasal congestion 5)  Return in 7-10 days if you're not better:sooner if you're feeling worse.   Prescriptions: GUAIFENESIN-CODEINE 100-10 MG/5ML LIQD (GUAIFENESIN-CODEINE) 5 to 10 cc  by mouth at bedtime as needed for   cough  #150cc x 0   Entered and Authorized by:   Nolon Rod. Karman Biswell MD   Signed by:   Nolon Rod. Deklynn Charlet MD on 07/24/2008   Method used:   Print then Give to Patient   RxID:   [id number] ASTEPRO 137 MCG/SPRAY SOLN (AZELASTINE HCL) 2 puff on each side of the nose two times a day  #1 x 1   Entered and Authorized by:   Elita Quick E. Elyanna Wallick MD   Signed by:   Nolon Rod. Adebayo Ensminger MD on 07/24/2008   Method used:   Print then Give to Patient   RxID:   [id number]  ]

## 2010-11-12 NOTE — Miscellaneous (Signed)
Summary: study update  Clinical Lists Changes  Medications: Removed medication of * STUDY DRUG (CARDIOLOGY)

## 2010-11-12 NOTE — Assessment & Plan Note (Signed)
Summary: cough/swh   Vital Signs:  Patient Profile:   60 Years Old Male Weight:      185.6 pounds O2 Sat:      92 % Temp:     99.2 degrees F oral BP sitting:   110 / 72  (right arm)  Vitals Entered By: Doristine Devoid (September 06, 2008 9:49 AM)                 Chief Complaint:  chest congestion and hurts to cough .  History of Present Illness: 60 yo man w/ cough which has now become painful.  Pt has been coughing for 3 days.  Cough is improving but now the chest hurts.  Cough productive of light green and white- mostly white- sputum.  No fevers.  No ear pain.  + sore throat earlier in illness.  No hx of seasonal allergies.  Hx of asthma- used albuterol inhaler yesterday AM w/ some relief of cough.  Taking left over cough medicine from last visit w/ good relief.    Current Allergies (reviewed today): No known allergies      Review of Systems      See HPI   Physical Exam  General:     alert and well-developed.   Head:     no tender to palpation of maxillary and frontal sinus area   Eyes:     no injxn or inflammation Ears:     R ear normal and L ear normal.   Nose:     (+)  congestion Mouth:     no erythema and no exudates.   Neck:     No deformities, masses, or tenderness noted. Lungs:     scattered inspiratory and expiratory wheezes Heart:     normal rate, regular rhythm, and no murmur.   Skin:     Intact without suspicious lesions or rashes Cervical Nodes:     No lymphadenopathy noted    Impression & Recommendations:  Problem # 1:  URI (ICD-465.9) Assessment: Unchanged Pt w/ viral URI given lack of bacterial infxn on PE.  Reviewed supportive care- pt to use cough syrup, Mucinex, and steroid nasal spray.  Pt expresses understanding and is in agreement w/ this plan. His updated medication list for this problem includes:    Aspirin 81 Mg Tbec (Aspirin)     Guaifenesin-codeine 100-10 Mg/54ml Liqd (Guaifenesin-codeine) .Marland KitchenMarland KitchenMarland KitchenMarland Kitchen 5 to 10 cc  by mouth at bedtime as needed for   cough   Problem # 2:  ASTHMA (ICD-493.90) Assessment: Unchanged Pt w/ some wheezing on exam.  Neb given in office- wheezing resolved.  pt to use inhaler regularly while not feeling well.  Pt expresses understanding and is in agreement w/ this plan. Orders: Nebulizer Tx ([id number])   Complete Medication List: 1)  Metoprolol Tartrate 25 Mg Tabs (Metoprolol tartrate) .Marland Kitchen.. 1 by mouth two times a day 2)  Diuretic ?name  3)  Lipitor 80 Mg Tabs (Atorvastatin calcium) .Marland Kitchen.. 1 by mouth once daily 4)  Study Drug (cardiology)  5)  Aspirin 81 Mg Tbec (Aspirin) 6)  Astepro 137 Mcg/spray Soln (Azelastine hcl) .... 2 puff on each side of the nose two times a day 7)  Guaifenesin-codeine 100-10 Mg/87ml Liqd (Guaifenesin-codeine) .... 5 to 10 cc  by mouth at bedtime as needed for   cough   Patient Instructions: 1)  Follow up as needed 2)  Use your albuterol inhaler as needed 3)  Mucinex DM for daytime cough 4)  Cough syrup for  night cough- will make you sleepy, do not drive! 5)  Use the nasal spray to decrease drainage- this will also help with your cough. 6)  Ibuprofen 400-600mg  every 6-8 hours as needed for your muscle pain associated w/ coughing 7)  Have a great thanksgiving!   Prescriptions: GUAIFENESIN-CODEINE 100-10 MG/5ML LIQD (GUAIFENESIN-CODEINE) 5 to 10 cc  by mouth at bedtime as needed for   cough  #150cc x 0   Entered and Authorized by:   Neena Rhymes MD   Signed by:   Neena Rhymes MD on 09/06/2008   Method used:   Print then Give to Patient   RxID:   [id number]  ]

## 2010-11-12 NOTE — Letter (Signed)
Summary: [facility]   Imported By: Freddy Jaksch 07/20/2007 12:29:01  _____________________________________________________________________  External Attachment:    Type:   Image     Comment:   External Document

## 2011-01-30 ENCOUNTER — Telehealth: Payer: Self-pay | Admitting: Cardiovascular Disease

## 2011-01-30 MED ORDER — ATORVASTATIN CALCIUM 80 MG PO TABS: 80.0000 mg | ORAL_TABLET | Freq: Every day | ORAL | Status: DC

## 2011-01-30 NOTE — Telephone Encounter (Signed)
Refill sent.

## 2011-02-04 ENCOUNTER — Encounter: Payer: Self-pay | Admitting: Physician Assistant

## 2011-02-04 ENCOUNTER — Ambulatory Visit (INDEPENDENT_AMBULATORY_CARE_PROVIDER_SITE_OTHER): Payer: Medicaid Other | Admitting: Physician Assistant

## 2011-02-04 VITALS — BP 136/80 | HR 53 | Ht 65.0 in | Wt 196.4 lb

## 2011-02-04 DIAGNOSIS — E785 Hyperlipidemia, unspecified: Secondary | ICD-10-CM

## 2011-02-04 DIAGNOSIS — I2510 Atherosclerotic heart disease of native coronary artery without angina pectoris: Secondary | ICD-10-CM

## 2011-02-04 NOTE — Patient Instructions (Signed)
Your physician recommends that you schedule a follow-up appointment in: 6 MONTHS WITH DR. Excell Seltzer  Your physician recommends that you return for lab work in: 02/25/11 BETWEEN 8:30 - 4:30 CMET 414.01, LIPID 272.4

## 2011-02-04 NOTE — Assessment & Plan Note (Signed)
Continue atorvastatin.  Arrange follow up CMET and Lipids.  Check CMET as he is on HCTZ as well.

## 2011-02-04 NOTE — Assessment & Plan Note (Signed)
Doing well. No angina.  Continue ASA and statin.  Follow up with Dr. Excell Seltzer in 6 months.  He will require stress testing in 05/2012 or after.

## 2011-02-04 NOTE — Progress Notes (Signed)
History of Present Illness: Primary Cardiologist: Dr. Tonny Bollman  Kevin Guzman is a 60 y.o. male with a h/o CAD, s/p NSTEMI 8/08 who was noted to have 2v CAD at cath, s/p CABG, HTN, HLP who presents for follow up.   He is doing well.  He denies chest pain, dyspnea, syncope, orthopnea, PND or edema.  No palpitations.  He was recently put on testosterone for testosterone deficiency which has resulted in improved mood.  He recently found work as well.  Past Medical History  Diagnosis Date  . CAD (coronary artery disease)     a. 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 EF 65% at cath 8/08  . HTN (hypertension)   . HLD (hyperlipidemia)   . Asthma   . Testosterone deficiency     Current Outpatient Prescriptions  Medication Sig Dispense Refill  . albuterol (VENTOLIN HFA) 108 (90 BASE) MCG/ACT inhaler Inhale 2 puffs into the lungs every 6 (six) hours as needed.        Marland Kitchen aspirin 81 MG EC tablet Take 81 mg by mouth daily.        Marland Kitchen atorvastatin (LIPITOR) 80 MG tablet Take 1 tablet (80 mg total) by mouth daily.  30 tablet  8  . hydrochlorothiazide (,MICROZIDE/HYDRODIURIL,) 12.5 MG capsule Take 12.5 mg by mouth every morning.       . Melatonin 3 MG CAPS Take 1 capsule by mouth at bedtime.        . metoprolol tartrate (LOPRESSOR) 25 MG tablet Take 25 mg by mouth 2 (two) times daily.        . Testosterone 1.25 GM/ACT (1%) GEL Place onto the skin.        Marland Kitchen DISCONTD: Testosterone Propionate 2 % CREA Place onto the skin.         No Known Allergies  Vital Signs: BP 136/80  Pulse 53  Ht 5\' 5"  (1.651 m)  Wt 196 lb 6.4 oz (89.086 kg)  BMI 32.68 kg/m2  PHYSICAL EXAM: Well nourished, well developed, in no acute distress HEENT: normal Neck: no JVD Vascular: no carotid bruits Cardiac:  normal S1, S2; RRR; no murmur Lungs:  clear to auscultation bilaterally, no wheezing, rhonchi or rales Abd: soft, nontender, no hepatomegaly, no bruits, no palpable mass Ext: no  edema Skin: warm and dry Neuro:  CNs 2-12 intact, no focal abnormalities noted Psych: normal affect  EKG:  Sinus bradycardia, HR 53, normal axis, no ischemic changes  ASSESSMENT AND PLAN:

## 2011-02-25 ENCOUNTER — Other Ambulatory Visit: Payer: Self-pay | Admitting: *Deleted

## 2011-02-25 NOTE — Cardiovascular Report (Signed)
Kevin Guzman NO.:  [id number]   MEDICAL RECORD NO.:  [id number]          PATIENT TYPE:  INP   LOCATION:  2807                         FACILITY:  [facility]   PHYSICIAN:  Veverly Fells. Excell Seltzer, MD  DATE OF BIRTH:  [birth year]   DATE OF PROCEDURE:  05/24/2007  DATE OF DISCHARGE:                            CARDIAC CATHETERIZATION   PROCEDURE:  Left heart catheterization, selective coronary angiography,  left ventricular angiography, left subclavian angiography and Star close  of the right femoral artery.   INDICATIONS:  Mr. Michael a 60 year old gentleman with  underlying  cardiac risk factors of hypertension, dyslipidemia and tobacco who  presented with a non-ST-elevation MI.  He was referred for cardiac  catheterization.   Risks and indications for the procedure was explained to the patient.  Informed consent was obtained.  The right groin was prepped, draped and  anesthetized with 1% lidocaine.  Using modified Seldinger technique a 6-  French sheath was placed in the right femoral artery.  Multiple views of  the left and right coronary arteries were taken using standard 6-French  preformed Judkins catheters.  Following selective coronary angiography  an angled pigtail catheter was inserted into the left ventricle and  pressures were recorded.  Left ventriculogram was performed and pullback  across the aortic valve was done.  A 6-French no torque right catheter  was used to inject the left subclavian artery for visualization of the  left subclavian and internal mammary.  At the conclusion of the  procedure, a Star close device was used to seal the femoral arteriotomy.  There were no immediate complications.   FINDINGS:  Aortic pressure 153/84 with a mean of 113, left ventricular  pressure 154/21.   The left mainstem tapers distally with an approximate 50% stenosis.  The  left mainstem trifurcates into the LAD, left circumflex and intermediate  branch.  The  LAD is a large-caliber vessel that courses down and wraps  around the LV apex.  There is a 95% ostial stenosis of the LAD.  There  is a medium-sized intermediate branch that arises just from the area of  severe stenosis.  Just beyond the area of severe stenosis, there is a  fairly large first diagonal branch that has no significant angiographic  disease.  The remaining portions of the mid distal LAD have no  significant angiographic stenosis.   Left circumflex is large caliber, it courses down and provides a left  PDA branch of as well as left posterolateral branch.  The left  circumflex is dominant.  There is a first OM branch that has a long  irregular 90% stenosis with poststenotic dilatation.   The right coronary artery is nondominant.  There is no significant  angiographic disease.   The left subclavian artery is widely patent.  The LIMA  is suitable for  grafting.   Left ventricular function is normal.  The LVEF is estimated at 60%.  There is no mitral regurgitation.   ASSESSMENT:  1. Severe two-vessel coronary artery disease with severe ostial LAD      stenosis and severe obtuse marginal stenosis of the left  circumflex.  Also there is mild to moderate distal left mainstem      disease.  2. Left dominant coronary circulation.  3. Normal left ventricular function.   PLAN:  I will resume heparin 4 hours after the sheath is out.  Will hold  clopidogrel.  I reviewed the case with Dr. Donata Clay and think that Mr.  Schnitker will be best suited with surgical revascularization.  I  discussed this at length with both the patient and his wife.      Veverly Fells. Excell Seltzer, MD  Electronically Signed     MDC/MEDQ  D:  05/24/2007  T:  05/25/2007  Job:  [id number]

## 2011-02-25 NOTE — H&P (Signed)
NAMEDALYN, BECKER           ACCOUNT NO.:  [id number]   MEDICAL RECORD NO.:  [id number]          PATIENT TYPE:   LOCATION:                                 FACILITY:   PHYSICIAN:  Sean A. Everardo All, MD    DATE OF BIRTH:  07/10/1951   DATE OF ADMISSION:  05/23/2007  DATE OF DISCHARGE:                              HISTORY & PHYSICAL   REASON FOR ADMISSION:  Syncope.   HISTORY OF PRESENT ILLNESS:  A 60 year old man who states that he  stopped taking his Atacand about 1 month ago.  He states that he did not  take it until yesterday evening, when he took one pill (he does not know  the dosage).   This morning, while at church he had the onset of several minutes of  severe non-vertiginous dizziness, with associated diaphoresis.  He is  not certain if he passed out altogether.  He also had some associated  slight pain across the anterior chest.   PAST MEDICAL HISTORY:  1. Hypertension.  2. He states that about a year and half ago, he had some type of      cardiology evaluation which sounds like a nuclear stress test; but      does not know where he had this done in [place].  3. He has minimal asthma, for which he takes albuterol.   He does not drink nor smoke.   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  He is married.   FAMILY HISTORY:  Negative for heart disease.   REVIEW OF SYSTEMS:  Denies the following:  Nausea, vomiting, fever,  headache, weight loss, dysuria, rectal bleeding, hematuria, visual loss,  shortness of breath, skin rash, seizure and abdominal pain.   PHYSICAL EXAMINATION:  Blood pressure 134/84, heart rate 61, respiratory  rate 20, temperature 97 degrees.  GENERAL:  No distress.  SKIN:  No rash, not diaphoretic.  HEENT: No proptosis.  No periorbital swelling.  Pharynx is normal.  NECK:  Supple.  CHEST: Clear to auscultation.  No respiratory distress.  The chest wall  is nontender.  CARDIOVASCULAR:  No  edema.  Regular rate and rhythm.  No  murmur.  Pedal pulses are intact and there is no bruit of the carotid  arteries.  EXTREMITIES:  Dorsalis pedis pulses are intact bilaterally.  No  deformities.  ABDOMEN: Soft, obese, nontender.  No hepatosplenomegaly, no mass.  GU/RECTAL:  Genitalia and rectal examinations not done at this time due  to patient condition.  NEUROLOGICAL:  Alert and oriented.  Does not appear anxious nor  depressed.  Cranial nerves appear to be intact and sensation is intact  to touch in the feet.   LABORATORY STUDIES:  Troponin is elevated.  CPK is normal, but CPK-MB is  elevated.  Electrocardiogram shows no acute change.   IMPRESSION:  1. Syncope or presyncope.  Relationship to resumption of his Atacand,      if any, is uncertain.  2. Chest pain of uncertain etiology.  Cardiac markers appear to be      positive.  3. Very mild asthma.   PLAN:  1. We discussed the potential risk of his situation, and he agrees to      hospitalization.  2. Complete cardiac enzymes.  3. Consult cardiology.  4. Will hold off on the Atacand for now.  5. Intravenous fluids.  6. I discussed code status with the patient and he requests FULL CODE.      Sean A. Everardo All, MD  Electronically Signed     SAE/MEDQ  D:  05/23/2007  T:  05/24/2007  Job:  [id number]   cc:   Leanne Chang, M.D.

## 2011-02-25 NOTE — Assessment & Plan Note (Signed)
[facility]                            CARDIOLOGY OFFICE NOTE   OBE, AHLERS                    MRN:          [id number]  DATE:02/21/2008                            DOB:          1950-11-11    Kevin Guzman was seen in follow-up at the [facility] Cardiology office  on Feb 21, 2008.  Kevin Guzman is a very nice 60 year old gentleman who  presented in August 2008, with a non-ST-elevation MI and was diagnosed  with severe multivessel CAD.  He underwent multivessel bypass surgery by  Dr. Donata Clay and has had an excellent recovery over the last 8 months.  He has completed cardiac rehab.  He has no complaints today.  Specifically, he denies chest pain, dyspnea, orthopnea, PND or edema.  He has had no lightheadedness, palpitations or syncope.  He had a period  where he felt like his chest wall was not stable with certain movements,  but this has resolved and he feels like things have now healed.  Mr.  Guzman has been sedentary over the winter and notes that he has gained  some weight.   MEDICATIONS:  1. Aspirin 81 mg daily.  2. Lipitor 80 mg daily.  3. Lopressor 25 mg twice daily.   ALLERGIES:  NKDA.   PHYSICAL EXAMINATION:  GENERAL:  He is alert and oriented in no acute  distress.  VITAL SIGNS:  Weight 194.  Blood pressure 138/100, heart rate 64,  respiratory rates 12.  HEENT:  Normal.  NECK:  Normal carotid upstrokes without bruits.  Jugulovenous pressure  is normal.  LUNGS:  Clear bilaterally.  HEART:  Regular rate and rhythm without murmurs or gallops.  ABDOMEN:  Soft and nontender.  No organomegaly.  EXTREMITIES:  No clubbing, cyanosis or edema.  Peripheral pulses are 2+  and equal throughout.   EKG shows normal sinus rhythm and is within normal limits.  Heart rate  is 63 beats per minute   ASSESSMENT:  1. Coronary artery disease, status post coronary artery bypass      grafting.  Continue antiplatelet therapy with aspirin,  continue      high-dose statin therapy with Lipitor and beta-blockade with      Lopressor.  Kevin Guzman blood pressure is too high.  See below      for details.  I encouraged him to increase exercise with goal      towards weight loss.  I also discussed with him enrollment in the      TRA-2P.  This a multicenter randomized trial evaluating an oral      thrombin receptor antagonist in a double-blind placebo controlled      manner.  He is interested and will discuss this with our research      nurses today.  For follow-up, I would like to see him back in 6      months.  2. Dyslipidemia.  He was given some samples of Lipitor 80 mg today.      His liver function tests back in October were normal.  His      cholesterol was 178, HDL 44, LDL  95.  Will repeat lipids and liver      function tests to make sure that he is at goal and is not having      hepatotoxicity.  3. Hypertension, suboptimal control.  Added hydrochlorothiazide.  I      discussed adding an angiotensin converting enzyme inhibitor, but he      is unable to tolerate secondary to cough.  Will add      hydrochlorothiazide 12.5 mg and repeat a metabolic panel in [redacted] weeks      along with his lipids and liver function tests.     Veverly Fells. Excell Seltzer, MD  Electronically Signed    MDC/MedQ  DD: 02/21/2008  DT: 02/21/2008  Job #: [id number]

## 2011-02-25 NOTE — Assessment & Plan Note (Signed)
OFFICE VISIT   Kevin Guzman, Kevin Guzman  DOB:  06-30-1951                                        June 18, 2007  CHART #:  [id number]   CURRENT PROBLEMS:  1. Status post CABG x4 on 05/27/2007 for unstable angina with severe      three vessel coronary artery disease.  2. Hypertension.  3. Hyperlipidemia.   PRESENT ILLNESS:  Kevin Guzman is a 60 year old male who presented with  unstable angina and a mild elevation in his cardiac enzymes. He was  found by cardiac cath to have a 50% left main stenosis and a 95% ostial  LAD stenosis with a well preserved LV function. He underwent bypass  grafts to the LAD, diagonal, obtuse marginal, and ramus intermediate.  His postoperative course has been uncomplicated and he has progressed  well at home. He remained in sinus rhythm. He remains on his hospital  discharge medications including Toprol XL 25 mg daily, Lipitor 80 mg  daily, aspirin 1 tablet daily, and p.r.n. pain pills.   PHYSICAL EXAMINATION:  VITAL SIGNS:  Blood pressure 140/80, pulse 90,  respirations 18, saturation 100%.  GENERAL:  He is alert and comfortable.  LUNGS:  Breath sounds are clear and equal.  CHEST:  The sternum is stable and well healed.  CARDIAC:  Rhythm is regular without S3 gallop or murmur.  EXTREMITIES:  The leg incisions are well healed and there is no pedal  edema.   A PA andlateral chest x-ray reveals clear lung fields, no plural  effusion, and a stable cardiac silhouette with well aligned sternal  wires.   PLAN:  The patient will be able to resume driving and light activities.  He is a good candidate for the outpatient cardiac rehab program. He is  well motivated and will his instructions and medications carefully. He  knows not to lift more than 20 pounds until mid-November. He will return  here as needed.   Kerin Perna, M.D.  Electronically Signed   PV/MEDQ  D:  06/18/2007  T:  06/19/2007  Job:  [id number]   cc:   Veverly Fells. Excell Seltzer, MD  Leanne Chang, M.D.

## 2011-02-25 NOTE — Assessment & Plan Note (Signed)
[facility]                            CARDIOLOGY OFFICE NOTE   Kevin, Guzman                    MRN:          [id number]  DATE:08/06/2007                            DOB:          [birth year]    Kevin Guzman was seen in followup at the [facility] Cardiology office  on August 06, 2007.  Kevin Guzman is a 60 year old gentleman who  presented to the hospital in August after a syncopal episode.  He  developed diaphoresis as well.  His initial cardiac enzymes were  positive and has ultimately lead to a cardiac catheterization which  demonstrated severe coronary artery disease.  Kevin Guzman was treated  with coronary bypass surgery by Dr. Donata Clay who performed 4-vessel  bypass on August 14.  He had an uneventful postoperative course.   Kevin Guzman is doing very well.  He is participating in cardiac rehab.  He has no complaints today.  He specifically denies exertional chest  pain or dyspnea.  He has no edema, orthopnea, PND, palpitations,  lightheadedness, or syncope.  He does have some occasional chest pain  with certain movements.   CURRENT MEDICATIONS:  Include only aspirin 81 mg daily and Lipitor 80 mg  daily.   PHYSICAL EXAMINATION:  He is alert and oriented and in no acute  distress.  Weight is 183, blood pressure 158/95 in the right arm, 152/97 in the  left arm.  Heart rate 87, respiratory rate 16.  HEENT:  Normal.  NECK:  Normal carotid upstrokes without bruits.  Jugular venous pressure  is normal.  LUNGS:  Clear to auscultation bilaterally.  HEART:  Regular rate and rhythm without murmurs or gallops.  ABDOMEN:  Soft, nontender, no organomegaly.  EXTREMITIES:  No cyanosis, clubbing, or edema.   ASSESSMENT:  1. Coronary artery disease, status post coronary artery bypass      grafting.  He is doing well at present with no symptoms.  I      encouraged him to continue regular exercise and participation in      cardiac rehab.  2. Hypertension.  Blood pressure is poorly controlled.  He is off of      his metoprolol after he ran out.  I wrote him a new prescription      for metoprolol succinate 50 mg daily and asked him to continue with      this medication indefinitely.  I would like to see him back in 3      months for followup to make sure that his blood pressure is under      better control.  3. Dyslipidemia.  I cannot find a copy of his lipids today.  He is on      Lipitor 80 mg.  I am going to check lipids and liver function tests      and follow up with him by phone as soon as the results are back.   For followup, as above, I will see him back in 3 months or sooner if any  new problems arise.     Veverly Fells. Excell Seltzer, MD  Electronically Signed  MDC/MedQ  DD: 08/06/2007  DT: 08/07/2007  Job #: [id number]

## 2011-02-25 NOTE — Discharge Summary (Signed)
[name], [name] NO.:  [id number]   MEDICAL RECORD NO.:  [id number]          PATIENT TYPE:  INP   LOCATION:  2029                         FACILITY:  [facility]   PHYSICIAN:  Rowe Clack, P.A.-C. DATE OF BIRTH:  Jul 25, 1951   DATE OF ADMISSION:  05/23/2007  DATE OF DISCHARGE:  06/01/2007                               DISCHARGE SUMMARY   HISTORY OF PRESENT ILLNESS:  The patient is a 60 year old male who  presented to the hospital with symptoms of unstable angina and mildly  positive cardiac enzymes.  He has had progressive increasing intensity  and frequency of burning chest pain with exertion.  He has also had  associated decreased exercise tolerance and easy fatigueability.  On the  day of admission, he had an episode of significant dizziness.  He had  previously been on Atacand, but had not taken it for several weeks, and  he took one dose of unknown strength the date prior to admission and he  developed dizziness and associated diaphoresis.  He was admitted through  the emergency department for further evaluation and treatment, as his  isoenzymes were elevated.  His EKG showed no significant changes.   PAST MEDICAL HISTORY:  Includes:  1. Hypertension.  2. Hyperlipidemia.  3. Previous inguinal hernia in 2007.   ALLERGIES:  No known drug allergies.   MEDICATIONS PRIOR TO ADMISSION:  Atacand and albuterol.   Family history, social history, review of systems and physical exam:  Please see history and physical done at the time of admission.   HOSPITAL COURSE:  The patient was admitted.  He was felt to require  cardiac catheterization, which was undertaken on May 24, 2007.  This  revealed osteal 95% stenosis of the LAD, 50% stenosis of the left main,  and an 80% stenosis of the obtuse marginal I.  The left ventricular  ejection fraction was preserved.  The right coronary artery was small  and nondominant.  Left ventricular end diastolic pressure was 18  mmHg.  Due to these findings, surgical consultation was obtained with Kerin Perna, MD, who evaluated the patient and studies, and agreed with the  recommendations.  Procedure was then scheduled and undertaken on May 27, 2007.   PROCEDURE:  Coronary artery bypass grafting x4.  The following grafts  were placed:  Left internal mammary artery to the left anterior  descending, saphenous graft to the diagonal, saphenous vein graft to the  obtuse marginal, saphenous vein graft to the ramus intermedius.  The  patient tolerated the procedure well.  Was taken to the surgical ICU in  stable condition.   POSTOPERATIVE HOSPITAL COURSE:  The patient has shown a study progress.  He has remained hemodynamically stable and neurologically intact.  All  routine lines, monitors and drainage devices have been discontinued in  the standard fashion.  He has required a gentle diuresis for a moderate  volume overload.  Oxygen has been weaned, and he maintained good  suturations on room air.  He has had no significant cardiac  dysrhythmias.  Laboratory values are stable, although he does have a  postoperative  acute blood loss anemia, with most recent hemoglobin and  hematocrit dated May 31, 2007 of 8.5 and 24.7.  Incisions are healing  well without evidence of infection.  He is tolerating routine  activities.  His overall status is felt to be tentatively stable for  discharge in the morning of June 01, 2007, pending morning round  reevaluation.   DISCHARGE MEDICATIONS:  Are as follows:  1. Aspirin 81 mg daily.  2. Toprol-XL 25 mg daily.  3. Lipitor 80 mg daily.  4. Folic acid 1 mg daily.  5. Lasix 40 mg daily for 5 days.  6. K-Dur 20 mEq daily for 5 days for pain.  7. Oxycodone 5 mg 1-2 q.4-6 hours p.r.n.   DISCHARGE INSTRUCTIONS:  The patient received written instructions in  regards to medications, activity, diet, wound care and followup.   FOLLOWUP:  Included Dr. Excell Seltzer in 2 weeks, Dr.  Donata Clay on June 18, 2007.   FINAL DIAGNOSES:  Includes Severe coronary artery disease with unstable  angina and a subendocardial myocardial infarction.   OTHER DIAGNOSES:  Includes:  1. Postoperative acute blood loss anemia.  2. Postoperative volume overload.  3. History of hypertension.  4. History of hyperlipidemia.  5. History of inguinal hernia.   CONDITION ON DISCHARGE:  Stable and improved.      Rowe Clack, P.A.-C.     Sherryll Burger  D:  05/31/2007  T:  05/31/2007  Job:  [id number]   cc:   Kevin Guzman. Excell Seltzer, MD  Kevin Guzman, M.D.

## 2011-02-25 NOTE — Assessment & Plan Note (Signed)
[facility]                            CARDIOLOGY OFFICE NOTE   LANE, ELAND                    MRN:          [id number]  DATE:11/03/2008                            DOB:          1951-07-27    REASON FOR VISIT:  Followup CAD.   HISTORY OF PRESENT ILLNESS:  Mr. Demmer is a 60 year old gentleman  with CAD who underwent coronary artery bypass surgery for severe  multivessel disease in August 2008.  He initially presented with a non-  ST-elevation MI and his symptoms were diaphoresis and syncope with an  absence of chest pain.  He has had no other similar episodes since his  surgery.  However, he describes right-sided chest pain just along the  sternum over the last several months.  His pain occurs at rest and feels  like a dull ache.  It is not exacerbated by exertion.  It has happened  on and off and he has had 3 such episodes today.  He has no dyspnea,  edema, orthopnea, or PND.  He denies lightheadedness, palpitations, or  syncope.   MEDICATIONS:  1. Aspirin 81 mg daily.  2. Lipitor 80 mg daily.  3. Lopressor 25 mg b.i.d.  4. HCTZ 12.5 mg daily  5. Study drug daily.   ALLERGIES:  NKDA.   PHYSICAL EXAMINATION:  GENERAL:  The patient is alert and oriented.  He  is in no acute distress.  VITAL SIGNS:  Weight is 189 pounds, blood pressure 128/80, heart rate  64, and respiratory rate 12.  HEENT:  Normal.  NECK:  Normal carotid upstrokes.  No bruits.  JVP normal.  LUNGS:  Clear bilaterally.  HEART:  Regular rate and rhythm.  No murmurs or gallops.  ABDOMEN:  Soft and nontender.  No organomegaly.  Obese.  EXTREMITIES:  No clubbing, cyanosis, or edema.  Peripheral pulses intact  and equal.   EKG shows normal sinus rhythm and is within normal limits.   ASSESSMENT:  1. Coronary artery disease status post coronary artery bypass graft.      The patient is having chest pain.  His symptoms are predominantly      atypical.  He presented  with an absence of chest pain initially at      the time of his non-ST-elevation myocardial infarction.  I would      like to check exercise Myoview stress scan to rule out ischemia in      the setting of his chest pain and known coronary artery disease.      He should continue his current medical regimen without changes at      present.  2. Dyslipidemia.  He remains on high-dose Lipitor.  Lipids were drawn      in November 2009, showed cholesterol 164, triglycerides 133, HDL      42, and LDL 95.  LFTs were normal.  We will continue without      changes.  3. Hypertension.  Blood pressure under good control on a combination      of Lopressor and HCTZ.  4. Medication compliance.  He frequently misses his meds.  I have a      note from our research staff that reports he has only been 70%      compliant with his medications at his 13-month visit.  I spoke with      him at length regarding importance of medication compliance and      development of assistance, he does not forget or run out of his      medicines.   For followup, I would like to see him back in 6 months.  We will review  his scan as soon as it is available.     Veverly Fells. Excell Seltzer, MD  Electronically Signed    MDC/MedQ  DD: 11/03/2008  DT: 11/04/2008  Job #: [id number]   cc:   Neena Rhymes, M.D.

## 2011-02-25 NOTE — Assessment & Plan Note (Signed)
[facility]                            CARDIOLOGY OFFICE NOTE   Kevin Guzman, Kevin Guzman                    MRN:          [id number]  DATE:06/23/2007                            DOB:          November 09, 1950    This is a patient of Dr. Excell Seltzer.   This is a 60 year old married white male patient who presented with  unstable angina and mild elevation of his cardiac enzymes who was found  to have 50% left main, 95% ostial LAD and well-preserved LV function. He  underwent CABG x4 with a LIMA to the LAD, SVG to the diagonal, SVG to  the OM and SVG to the ramus intermedius. The patient tolerated it well,  had some acute blood loss anemia and volume overload postoperatively.  Since his discharge, he has done quite well. He actually saw Dr. Donata Clay last week, and chest x-ray was stable. He says he is currently  dealing with outside stressors that are causing most of his problems. He  is having to look for a job as his was outsourced to [place], and he is  raising 3 of his young grandchildren with his wife. Yesterday while  driving, he had a sharp shooting pain through his chest that was short  lived and has not recurred. He is awaiting to hear from his insurance  whether they will pay for his cardiac rehabilitation or not.   CURRENT MEDICATIONS:  1. Aspirin 81 mg daily.  2. Toprol-XL 25 mg daily.  3. Lipitor 80 mg daily.  4. Folic acid 1 mg daily.  5. Iron daily.   PHYSICAL EXAMINATION:  This is a pleasant 60 year old white male in no  acute distress. Blood pressure 132/86, pulse 80, weight 179.  NECK:  Without JVD, HR, bruit or thyroid enlargement.  LUNGS:  Decreased breath sounds but clear anterior, posterior and  lateral.  HEART:  Regular rate and rhythm at 80 beats per minute. Normal S1 and  S2. Distant heart sounds. No murmur, rub, bruit, heave or thrill noted.  ABDOMEN:  Soft without organomegaly, masses, lesions or abnormal  tenderness.  EXTREMITIES:   Without clubbing, cyanosis, or edema. Good distal pulses.   EKG pending at time of dictation. Chest x-ray done at Dr. Zenaida [facility]  was stable.   IMPRESSION:  1. Coronary artery disease status post coronary artery bypass grafting      x4 on May 27, 2007.  2. Hypertension.  3. Hyperlipidemia.  4. Outside stressors.   PLAN:  At this time, the patient is doing well from a cardiac  standpoint. I will recheck a CBC and BMET today. He is a bit pale, and  his hemoglobin at discharge was 8.5 and potassium was 3.4. otherwise, he  will see Dr. Excell Seltzer back in October and do cardiac rehabilitation.      Jacolyn Reedy, PA-C  Electronically Signed      Luis Abed, MD, [facility]  Electronically Signed   ML/MedQ  DD: 06/23/2007  DT: 06/24/2007  Job #: [id number]   cc:   Kerin Perna, M.D.  Leanne Chang, M.D.

## 2011-02-25 NOTE — Consult Note (Signed)
Kevin Guzman, Kevin Guzman           ACCOUNT NO.:  [id number]   MEDICAL RECORD NO.:  [id number]          PATIENT TYPE:  INP   LOCATION:  3739                         FACILITY:  [facility]   PHYSICIAN:  Bevelyn Buckles. Bensimhon, MDDATE OF BIRTH:  [birth year]   DATE OF CONSULTATION:  05/23/2007  DATE OF DISCHARGE:                                 CONSULTATION   REASON FOR CONSULTATION:  Non-ST elevation, myocardial infarction.   HISTORY OF PRESENT ILLNESS:  Kevin Guzman is a very pleasant is a very  pleasant 60 year old male with a history of hypertension,  hyperlipidemia, chronic cough, and previous tobacco use.  Apparently he  had a stress test about a year and a half ago for some chest pain.  This  was an Adenosine Myoview which he told me was negative.  Today, in  church, he had an episode of diaphoresis and presyncope.  He was feeling  ill.  He denies chest pain or shortness of breath at the time.  He was  able to walk downstairs in the church and actually got a ride over to  the fire station and he was brought to emergency room.  In the emergency  room, EKG was normal.  The first set of cardiac markers were normal.  He  was admitted for observation.  While on the floor, he had another  episode of chest pain.  This was relieved with nitroglycerin.  Followup  cardiac enzymes have come back positive with a CK of 135, MB of 7.8 and  a troponin of 0.49.  He is currently asymptomatic.   REVIEW OF SYSTEMS:  On review of systems, he does tell me that over the  past few months he has had exertional chest burning which is somewhat  progressive.  He has not had any resting symptoms.  He is under quite a  bit stress as he recently lost his job on Thursday.  He has also  recently adopted his grand kids as his children are having problems with  methamphetamine addiction.  He has not had any heart failure symptoms.  He has not had any bleeding.  No bright red blood per rectum.  No  melena.  No focal  neurologic signs.  The remainder of the review of  systems is negative except for HPI and problem list.   PROBLEM LIST:  1. Hypertension.  2. Hyperlipidemia.  3. Previous tobacco use, quit several years ago.  4. High stress.  5. Chronic cough.   HOME MEDICATIONS:  1. Atacand 8 mg a day.  2. Lipitor 20.   ALLERGIES:  He denies any medication allergies.   SOCIAL HISTORY:  He is married.  He has three adopted kids.  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  He has a history of  tobacco, one pack per day for many years, quit about five years ago.  He  denies any significant alcohol use or drugs.   FAMILY HISTORY:  Mother died at age 66 due to cervical cancer.  Father  died at 19 due to complications of COPD.  He has two brothers and one  sister.  There is no history of coronary artery disease.   PHYSICAL EXAMINATION:  GENERAL:  He is well appearing, in no acute  distress.  VITAL SIGNS:  Blood pressure is 146/87, heart rate is 61, he is sating  at 95% on room air.  Respirations are unlabored.  HEENT:  Normal.  NECK:  Supple, no JVD.  Carotids are 2+ bilaterally, no bruits.  There  is no lymphadenopathy or thyromegaly.  CARDIAC:  He has a regular rate and rhythm with no murmurs, rubs, or  gallops.  PMI is nondisplaced.  LUNGS:  Clear.  ABDOMEN:  Soft, nontender, not distended, no hepatosplenomegaly, no  bruits, no masses, good bowel sounds.  EXTREMITIES:  Warm with no cyanosis, clubbing, or edema.  Femoral pulses  are 2+ bilaterally.  DP pulses 1+ on the right, 2+ on the left.  NEURO:  He is alert and oriented x3.  Cranial nerves II-XII are intact.  He moves all four extremities without difficulty.  PSYCH:  Affect is very pleasant.   LABORATORY DATA:  EKG shows normal sinus rhythm at a rate of 61, no  significant ST-T wave abnormality.  He does have a non-specific  intraventricular conduction delay.  Chest x-ray shows no active  cardiopulmonary disease.   Initial point of care of cardiac markers shows  a CK-MB of 1.3 and a troponin of less than 0.05.  Followup CK was 138  with an MB of 7.8 and a troponin of 0.49.  D-dimer was less than 0.22,  sodium 140, potassium 4.7, chloride of 102, CO2 of 30, BUN 15,  creatinine 0.97, glucose of 112.  CBC; white count of 8.7, platelets of  268, hemoglobin of 15.0.   ASSESSMENT:  1. Non-ST elevation myocardial infarction.  2. Hypertension.  3. Hyperlipidemia.  4. Previous tobacco use.   PLAN:  1. We start Heparin and give him Plavix 300, nitroglycerin and beta-      blocker, and increase his Lipitor to 80 mg a day.  2. He will need a cath tomorrow.  I have discussed the risks and      indication at length and he agrees to proceed.  He will also need a      cardiac rehab evaluation.      Bevelyn Buckles. Bensimhon, MD  Electronically Signed     DRB/MEDQ  D:  05/23/2007  T:  05/24/2007  Job:  [id number]   cc:   Gregary Signs A. Everardo All, MD

## 2011-02-25 NOTE — Consult Note (Signed)
NAMERAINEN, VANROSSUM NO.:  [id number]   MEDICAL RECORD NO.:  [id number]          PATIENT TYPE:  INP   LOCATION:  2922                         FACILITY:  [facility]   PHYSICIAN:  Kerin Perna, M.D.  DATE OF BIRTH:  [birth year]   DATE OF CONSULTATION:  05/24/2007  DATE OF DISCHARGE:                                 CONSULTATION   REASON FOR CONSULTATION:  Severe coronary artery disease with unstable  angina and subendocardial myocardial infarction.   CHIEF COMPLAINT:  Chest pain.   HISTORY OF PRESENT ILLNESS:  I was asked to evaluate this 60 year old  male who presented to the hospital with symptoms of unstable angina and  mildly positive cardiac enzymes.  He has had progressive increased  intensity in frequency of burning chest pain on exertion.  He has also  had associated decreased exercise tolerance and easy fatigability.  Cardiac catheterization was performed today which shows an ostial 95%  stenosis at the LAD, 50% stenosis of the left main and an 80% stenosis  of the OM-1.  The LVEF is preserved.  Right coronary is small and non  dominant.  LVEDP was 18 mm Hg.   PAST MEDICAL HISTORY:  1. Hypertension.  2. Hyperlipidemia.  3. Status post inguinal herniorrhaphy in 2007.  4. Three-hundred mg Plavix load 05/23/2007.   ALLERGIES:  NO KNOWN DRUG ALLERGIES.   MEDICATIONS:  Atacand and albuterol.   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  He has  not smoked for years and he does not use alcohol.   REVIEW OF SYSTEMS:  CONSTITUTIONAL:  No weight loss, fever or night  sweats.  ENDOCRINE:  No diabetes or thyroid disease.  No history of a  stroke or seizure.  No history of DVT, claudication or TIA.  MUSCULOSKELETAL:  Positive for a remote right hand fracture and a  concussion in mid childhood.  GI:  Negative for blood per rectum or  jaundice.  HEMATOLOGIC:  Negative for bleeding disorder.   PHYSICAL EXAMINATION:  VITAL SIGNS:  The patient  is 5 feet, 4 inches,  weight is 180 pounds. Blood pressure 110/70 pulse 70, saturation 95% in  room air.  GENERAL APPEARANCE:  Is that of a pleasant, middle-aged male in the CCU  following cardiac catheterization in no distress.  HEENT EXAM:  Normocephalic.  Good dentition.  NECK:  Without JVD, masses or carotid bruit.  LYMPHATICS:  Reveal no palpable supraclavicular or cervical adenopathy.  LUNGS:  Breath sounds are clear. Thorax is without deformity.  CARDIAC EXAM:  Regular rhythm without S3 gallop or murmur.  ABDOMEN:  Soft, nontender without pulsatile mass.  EXTREMITIES:  Reveal no clubbing, edema or cyanosis.  Peripheral pulses  are 2+ in all extremities.  NEUROLOGIC EXAM:  Intact.  GROIN:  Dry with a compression dressing.   LABORATORY DATA:  I reviewed the coronary arteriograms and the patient  would benefit from bypass grafts to the LAD, ramus intermediate and OM-  1.   I have discussed the procedure with the patient including plan to  provide a Plavix wash out for 72 hours. Surgery  will be scheduled for  May 27, 2007.   Thank you for the consultation.      Kerin Perna, M.D.  Electronically Signed     PV/MEDQ  D:  05/24/2007  T:  05/24/2007  Job:  [id number]   cc:   [facility] office

## 2011-02-25 NOTE — Op Note (Signed)
Kevin Guzman, Kevin Guzman NO.:  [id number]   MEDICAL RECORD NO.:  [id number]          PATIENT TYPE:  INP   LOCATION:  2303                         FACILITY:  [facility]   PHYSICIAN:  Kerin Perna, M.D.  DATE OF BIRTH:  02/18/1951   DATE OF PROCEDURE:  05/27/2007  DATE OF DISCHARGE:                               OPERATIVE REPORT   OPERATION:  1. Coronary artery bypass grafting x4 (left internal mammary artery to      left anterior descending, saphenous vein graft to diagonal,      saphenous vein graft to obtuse marginal, saphenous vein graft to      ramus intermediate).  2. Endoscopic vein harvest of the greater saphenous vein from both the      right and left legs.   SURGEON:  Kerin Perna, M.D.   ASSISTANT:  Charlett Lango, MD and Clint Guy, SA   ANESTHESIA:  General.   POSTOPERATIVE DIAGNOSIS:  Unstable angina with severe coronary disease.   CLINICAL NOTE:  The patient is a 60 year old male who presented with  symptoms of unstable angina and mild elevation in his cardiac enzymes.  Cardiac catheterization by Dr. Tonny Bollman demonstrated a 95% ostial  LAD stenosis as well as 50% left main stenosis and a left dominant  coronary circulation with disease at the takeoff of the ramus  intermediate and of the first circumflex marginal.  His LV function was  preserved.  He is felt to be candidate for surgical revascularization.  Prior to surgery I examined the patient in the CCU and reviewed results  of the cardiac cath with the patient and family.  I discussed  indications, benefits, risks and alternatives to coronary bypass surgery  for treatment of his coronary disease.  Because he was given a Plavix  load just prior to his cath, he waited days for a Plavix washout before  proceeding with surgery.  I reviewed the risks of MI, stroke, bleeding,  blood transfusion requirement, infection, and death with the patient and  he understood these issues and  agreed to proceed with surgery under what  I felt was an informed consent.   OPERATIVE FINDINGS:  The vein was harvested from both legs and was of  adequate quality.  The left internal mammary artery was used as a  pedicle graft and was good vessel.  The coronaries were intramyocardial.  The patient received a unit of platelets after reversal of heparin with  protamine due to persistent coagulopathy probably related to  preoperative Plavix.   PROCEDURE:  The patient was brought to operative room placed supine on  the operating room table where general anesthesia was induced under  invasive hemodynamic monitoring.  The chest, abdomen and legs were  prepped with Betadine and draped as a sterile field.  A sternal incision  was made as the saphenous vein was harvested from both legs.  The left  internal mammary artery was harvested as a pedicle graft from its origin  at the subclavian vessels.  It was a smaller 1.4 to 1.5 mm vessel but  had good flow.  The sternal retractor was  placed and the pericardium was  opened and elevated.  Heparin was administered and the ACT was  documented as being therapeutic.  After the vein had been inspected and  found be adequate, the patient was cannulated through pursestrings in  the ascending aorta and right atrium and placed on bypass.  The  coronaries were identified for grafting in the mammary artery and vein  grafts were prepared for the distal anastomoses.  Cardioplegia catheters  were placed for both antegrade and retrograde cold blood cardioplegia.  The patient was cooled to 32 degrees and the aortic crossclamp was  applied.   800 mL of cold blood cardioplegia was delivered in split doses between  the antegrade aortic and retrograde coronary sinus catheters.  There is  good cardioplegic arrest and septal temperature dropped less than 12  degrees.  Cardioplegia was delivered every 20 minutes while the  crossclamp was in place.   The distal  coronary anastomoses were then performed.  The first distal  anastomosis was to the circumflex OM1.  This was a 1.5-mm vessel and had  a proximal 90% stenosis and reversed saphenous vein was sewn end-to-side  with running 7-0 Prolene with good flow through the graft.  The second  distal anastomosis was to the ramus intermediate.  This a 1.4-mm vessel  intramyocardial with proximal 80% stenosis and a reverse saphenous vein  sewn end-to-side with running 7-0 Prolene with good flow through graft.  The third distal anastomosis was to the diagonal branch to LAD.  Although this did not have a tight stenosis on the coronary arteriogram,  there was very heavily calcified plaque at the region of its takeoff and  I felt that a bypass would be indicated to avoid ischemia in the  distribution of this vessel especially in the near future.  A reverse  saphenous vein was then sewn end-to-side using running 7-0 Prolene to  this 1.5-mm vessel.  The fourth distal anastomosis was to the mid-LAD  where it became more superficial in its intramyocardial location.  It  was a 1.5-1.7 mm vessel.  Left IMA pedicle was brought through an  opening created in the left lateral pericardium and was brought down  onto the LAD and sewn end-to-side with running 8-0 Prolene.  There is  good flow through the anastomosis after briefly releasing the pedicle  bulldog on the mammary artery.  The bulldog was reapplied and a pedicle  was secured to the epicardium.  Cardioplegia was redosed.   While the crossclamp was still in place, three proximal vein anastomoses  were performed on the ascending aorta using a 4.0-mm punch running 6-0  Prolene.  Prior to tying down the final proximal anastomosis, air was  aspirated from the coronaries and left side of heart using a dose of  retrograde warm blood cardioplegia (hot shot) and the usual filling  maneuvers of the left ventricle on bypass.  The final proximal  anastomosis was tied and  the crossclamp was removed.   Air was aspirated from the vein grafts with 27 gauge needle and  cardioplegia catheters were removed.  The heart resumed a spontaneous  rhythm.  The bypass grafts were checked and found be patent with good  flow and with good hemostasis proximal and distal with respect to the  anastomoses.   The patient was rewarmed to 37 degrees and temporary pacing wires were  applied.  When the patient had been adequately reperfused rewarmed the  lungs were re-expanded and the ventilator was resumed.  The patient was  weaned from bypass without require inotropes.  Blood pressure and  cardiac output were stable.  Protamine was administered without adverse  reaction.  The cannulas were removed.  The mediastinum was irrigated  with warm antibiotic irrigation.  Leg incision was irrigated and closed  in a standard fashion.  The superior pericardial fat was closed over the  aorta.  Two mediastinal and left pleural chest tube were placed and  brought out through separate incisions.  The sternum was closed with  interrupted steel wire.  The pectoralis fascia was closed in running #1  Vicryl.  The subcutaneous and skin layers were closed in running Vicryl  and sterile dressings were applied.  Total bypass time was 140 minutes,  crossclamp time of 80 minutes.      Kerin Perna, M.D.  Electronically Signed     PV/MEDQ  D:  05/27/2007  T:  05/28/2007  Job:  [id number]   cc:   Veverly Fells. Excell Seltzer, MD

## 2011-02-28 NOTE — Op Note (Signed)
Kevin Guzman, Kevin Guzman           ACCOUNT NO.:  [id number]   MEDICAL RECORD NO.:  [id number]          PATIENT TYPE:  AMB   LOCATION:  SDS                          FACILITY:  [facility]   PHYSICIAN:  Cherylynn Ridges, M.D.    DATE OF BIRTH:  [birth year]   DATE OF PROCEDURE:  09/29/2006  DATE OF DISCHARGE:  09/29/2006                               OPERATIVE REPORT   PREOPERATIVE DIAGNOSIS:  Right inguinal hernia.   POSTOPERATIVE DIAGNOSIS:  Right direct inguinal hernia.   PROCEDURE:  Right inguinal hernia repair with mesh.   SURGEON:  Jimmye Norman, MD.   ANESTHESIA:  General with a laryngeal airway.   ESTIMATED BLOOD LOSS:  Less than 20 cc.   COMPLICATIONS:  None.   CONDITION:  Stable.   FINDINGS:  Large direct inguinal hernia with no indirect component.   INDICATIONS FOR OPERATION:  The patient is a 60 year old with  symptomatic right inguinal hernia who comes in now for repair operation.  The patient was taken to the operating room and placed on the table in  the supine position.  After an adequate general laryngeal airway  anesthetic was administered, he was prepped and draped in the usual,  sterile manner exposing the right inguinal area.   A right transverse incision was made at the level of the superficial  ring, taken down through the subcutaneous tissue using electrocautery,  down through the external oblique fascia.  Once the fascia was exposed,  we opened it along its fibers, through the superficial ring and then  identified the large, direct sac coming out medial to the spermatic  cord.  We isolated the spermatic cord with a Penrose drain, then  dissected away the large direct sac away from the cord.  We then  inspected the cord for an indirect sac and none was found, although  there was a lipoma.  There was no associated sac.   We repaired the direct hernia by imbricating it on itself using 0  Ethibond sutures, suturing it down with approximately five interrupted  simple stitches of 0 Ethibond.  We then laid in a piece of over mesh  measuring 5 x 3 cm in size, attaching it to the conjoined tendon  anteromedially and the reflected portion of the inguinal ligament  inferolaterally.  This was done using a running 0 Prolene suture.  Once  this was done, we irrigated with antibiotic solution in which the mesh  had been soaked prior to implantation.   We allowed the cord to fall back into the canal.  The ilioinguinal nerve  was not disturbed during the process.  We re-approximated the external  oblique fascia on top of the cord using a running, 3-0 Vicryl.  Then  Scarpa's fascia was re-approximated using interrupted, 3-0 Vicryl.  The  skin was closed using running subcuticular stitch of 4-0  Monocryl.  Marcaine, 0.25%, without epinephrine was injected into the  wound and at the anterior superior iliac spine prior to closure, 20 cc  used.  A sterile dressing was applied.  All needle counts, sponge counts  and instrument counts were correct.  Cherylynn Ridges, M.D.  Electronically Signed     JOW/MEDQ  D:  09/29/2006  T:  09/29/2006  Job:  [id number]   cc:   Leanne Chang, M.D.

## 2011-03-26 ENCOUNTER — Other Ambulatory Visit: Payer: Self-pay | Admitting: Cardiovascular Disease

## 2011-04-29 ENCOUNTER — Other Ambulatory Visit: Payer: Self-pay | Admitting: Cardiovascular Disease

## 2011-07-14 ENCOUNTER — Other Ambulatory Visit: Payer: Self-pay | Admitting: Cardiovascular Disease

## 2011-07-25 LAB — BASIC METABOLIC PANEL
BUN: 13
BUN: 16
BUN: 9
CO2: 24
CO2: 27
CO2: 28
Calcium: 7 — ABNORMAL LOW
Calcium: 7 — ABNORMAL LOW
Calcium: 7.9 — ABNORMAL LOW
Chloride: 106
Chloride: 106
Chloride: 110
Creatinine, Ser: 1.03
Creatinine, Ser: 1.08
Creatinine, Ser: 1.19
GFR calc Af Amer: >60
GFR calc Af Amer: >60
GFR calc Af Amer: >60
GFR calc non Af Amer: >60
GFR calc non Af Amer: >60
GFR calc non Af Amer: >60
Glucose, Bld: 104 — ABNORMAL HIGH
Glucose, Bld: 107 — ABNORMAL HIGH
Glucose, Bld: 81
Potassium: 3.8
Potassium: 3.8
Potassium: 4.3
Sodium: 137
Sodium: 140
Sodium: 142

## 2011-07-25 LAB — CBC
HCT: 23.6 — ABNORMAL LOW
HCT: 23.7 — ABNORMAL LOW
HCT: 24.4 — ABNORMAL LOW
HCT: 24.7 — ABNORMAL LOW
HCT: 25 — ABNORMAL LOW
Hemoglobin: 8 — ABNORMAL LOW
Hemoglobin: 8.3 — ABNORMAL LOW
Hemoglobin: 8.3 — ABNORMAL LOW
Hemoglobin: 8.5 — ABNORMAL LOW
Hemoglobin: 8.5 — ABNORMAL LOW
MCHC: 34
MCHC: 34.1
MCHC: 34.2
MCHC: 34.5
MCHC: 35.1
MCV: 85.5
MCV: 86.6
MCV: 86.9
MCV: 87.5
MCV: 87.9
Platelets: 153
Platelets: 172
Platelets: 175
Platelets: 181
Platelets: 219
RBC: 2.69 — ABNORMAL LOW
RBC: 2.77 — ABNORMAL LOW
RBC: 2.78 — ABNORMAL LOW
RBC: 2.85 — ABNORMAL LOW
RBC: 2.89 — ABNORMAL LOW
RDW: 13.2
RDW: 13.5
RDW: 13.7
RDW: 13.7
RDW: 13.8
WBC: 6.7
WBC: 7.1
WBC: 7.3
WBC: 7.5
WBC: 7.9

## 2011-07-25 LAB — CREATININE, SERUM
Creatinine, Ser: 1.1
GFR calc Af Amer: >60
GFR calc non Af Amer: >60

## 2011-07-25 LAB — I-STAT EC8
Acid-base deficit: 1
BUN: 12
Bicarbonate: 23.9
Chloride: 103
Glucose, Bld: 108 — ABNORMAL HIGH
HCT: 24 — ABNORMAL LOW
Hemoglobin: 8.2 — ABNORMAL LOW
Operator id: 113031
Potassium: 3.8
Sodium: 139
TCO2: 25
pCO2 arterial: 38.1
pH, Arterial: 7.406

## 2011-07-25 LAB — MAGNESIUM
Magnesium: 2.2
Magnesium: 2.3

## 2011-07-28 LAB — POCT I-STAT 3, ART BLOOD GAS (G3+)
Acid-Base Excess: 1
Acid-Base Excess: 2
Acid-base deficit: 1
Acid-base deficit: 3 — ABNORMAL HIGH
Bicarbonate: 20.9
Bicarbonate: 22.7
Bicarbonate: 24.9 — ABNORMAL HIGH
Bicarbonate: 26 — ABNORMAL HIGH
O2 Saturation: 100
O2 Saturation: 100
O2 Saturation: 94
O2 Saturation: 96
Operator id: 113031
Operator id: 274841
Operator id: 3390
Operator id: 3390
Patient temperature: 31.5
Patient temperature: 35.5
Patient temperature: 37.2
Patient temperature: 37.2
TCO2: 22
TCO2: 24
TCO2: 26
TCO2: 27
pCO2 arterial: 27.8 — ABNORMAL LOW
pCO2 arterial: 31.4 — ABNORMAL LOW
pCO2 arterial: 32 — ABNORMAL LOW
pCO2 arterial: 38.2
pH, Arterial: 7.433
pH, Arterial: 7.443
pH, Arterial: 7.452 — ABNORMAL HIGH
pH, Arterial: 7.538 — ABNORMAL HIGH
pO2, Arterial: 214 — ABNORMAL HIGH
pO2, Arterial: 251 — ABNORMAL HIGH
pO2, Arterial: 63 — ABNORMAL LOW
pO2, Arterial: 80

## 2011-07-28 LAB — I-STAT EC8
Acid-base deficit: 4 — ABNORMAL HIGH
BUN: 8
Bicarbonate: 21.3
Chloride: 108
Glucose, Bld: 80
HCT: 22 — ABNORMAL LOW
Hemoglobin: 7.5 — CL
Operator id: 123161
Potassium: 3.4 — ABNORMAL LOW
Sodium: 146 — ABNORMAL HIGH
TCO2: 22
pCO2 arterial: 38.4
pH, Arterial: 7.351

## 2011-07-28 LAB — BASIC METABOLIC PANEL
BUN: 13
BUN: 15
CO2: 26
CO2: 30
Calcium: 8.2 — ABNORMAL LOW
Calcium: 9.2
Chloride: 105
Chloride: 109
Creatinine, Ser: 0.98
Creatinine, Ser: 1.13
GFR calc Af Amer: >60
GFR calc Af Amer: >60
GFR calc non Af Amer: >60
GFR calc non Af Amer: >60
Glucose, Bld: 100 — ABNORMAL HIGH
Glucose, Bld: 99
Potassium: 3.7
Potassium: 4.4
Sodium: 141
Sodium: 143

## 2011-07-28 LAB — CREATININE, SERUM
Creatinine, Ser: 0.95
GFR calc Af Amer: >60
GFR calc non Af Amer: >60

## 2011-07-28 LAB — CBC
HCT: 27.1 — ABNORMAL LOW
HCT: 27.5 — ABNORMAL LOW
HCT: 37.6 — ABNORMAL LOW
HCT: 38.9 — ABNORMAL LOW
HCT: 39.1
HCT: 39.6
HCT: 43.8
Hemoglobin: 13
Hemoglobin: 13.3
Hemoglobin: 13.4
Hemoglobin: 13.6
Hemoglobin: 15
Hemoglobin: 9.3 — ABNORMAL LOW
Hemoglobin: 9.7 — ABNORMAL LOW
MCHC: 34.2
MCHC: 34.2
MCHC: 34.3
MCHC: 34.3
MCHC: 34.4
MCHC: 34.5
MCHC: 35.2
MCV: 84.7
MCV: 86.2
MCV: 86.3
MCV: 86.4
MCV: 86.6
MCV: 86.7
MCV: 87.1
Platelets: 173
Platelets: 192
Platelets: 264
Platelets: 265
Platelets: 268
Platelets: 276
Platelets: 278
RBC: 3.14 — ABNORMAL LOW
RBC: 3.25 — ABNORMAL LOW
RBC: 4.33
RBC: 4.5
RBC: 4.51
RBC: 4.54
RBC: 5.08
RDW: 13
RDW: 13.1
RDW: 13.1
RDW: 13.2
RDW: 13.2
RDW: 13.4
RDW: 13.5
WBC: 6.5
WBC: 7.9
WBC: 7.9
WBC: 8.3
WBC: 8.3
WBC: 8.3
WBC: 8.7

## 2011-07-28 LAB — DIFFERENTIAL
Basophils Absolute: 0
Basophils Relative: 0
Eosinophils Absolute: 0.1
Eosinophils Relative: 1
Lymphocytes Relative: 13
Lymphs Abs: 1.1
Monocytes Absolute: 0.4
Monocytes Relative: 4
Neutro Abs: 7.1
Neutrophils Relative %: 82 — ABNORMAL HIGH

## 2011-07-28 LAB — I-STAT 8, (EC8 V) (CONVERTED LAB)
Acid-base deficit: 1
BUN: 21
Bicarbonate: 21.7
Chloride: 108
Glucose, Bld: 111 — ABNORMAL HIGH
HCT: 47
Hemoglobin: 16
Operator id: 272551
Potassium: 3.7
Sodium: 138
TCO2: 23
pCO2, Ven: 30.1 — ABNORMAL LOW
pH, Ven: 7.466 — ABNORMAL HIGH

## 2011-07-28 LAB — POCT I-STAT 4, (NA,K, GLUC, HGB,HCT)
Glucose, Bld: 107 — ABNORMAL HIGH
Glucose, Bld: 109 — ABNORMAL HIGH
Glucose, Bld: 112 — ABNORMAL HIGH
Glucose, Bld: 125 — ABNORMAL HIGH
Glucose, Bld: 137 — ABNORMAL HIGH
HCT: 22 — ABNORMAL LOW
HCT: 22 — ABNORMAL LOW
HCT: 23 — ABNORMAL LOW
HCT: 26 — ABNORMAL LOW
HCT: 34 — ABNORMAL LOW
Hemoglobin: 11.6 — ABNORMAL LOW
Hemoglobin: 7.5 — CL
Hemoglobin: 7.5 — CL
Hemoglobin: 7.8 — CL
Hemoglobin: 8.8 — ABNORMAL LOW
Operator id: 274841
Operator id: 3390
Operator id: 3390
Operator id: 3390
Operator id: 3390
Potassium: 3.2 — ABNORMAL LOW
Potassium: 3.4 — ABNORMAL LOW
Potassium: 4.1
Potassium: 4.8
Potassium: 5.1
Sodium: 132 — ABNORMAL LOW
Sodium: 133 — ABNORMAL LOW
Sodium: 136
Sodium: 139
Sodium: 140

## 2011-07-28 LAB — POCT I-STAT GLUCOSE
Glucose, Bld: 96
Operator id: 3390

## 2011-07-28 LAB — BLOOD GAS, ARTERIAL
Acid-Base Excess: 1.3
Bicarbonate: 25.3 — ABNORMAL HIGH
FIO2: 0.21
O2 Saturation: 95.7
Patient temperature: 98.6
TCO2: 26.5
pCO2 arterial: 39.6
pH, Arterial: 7.422
pO2, Arterial: 74.1 — ABNORMAL LOW

## 2011-07-28 LAB — URINALYSIS, ROUTINE W REFLEX MICROSCOPIC
Bilirubin Urine: NEGATIVE
Glucose, UA: NEGATIVE
Hgb urine dipstick: NEGATIVE
Ketones, ur: NEGATIVE
Nitrite: NEGATIVE
Protein, ur: NEGATIVE
Specific Gravity, Urine: 1.005
Urobilinogen, UA: 0.2
pH: 6.5

## 2011-07-28 LAB — APTT
aPTT: 36
aPTT: 99 — ABNORMAL HIGH

## 2011-07-28 LAB — POCT I-STAT 3, VENOUS BLOOD GAS (G3P V)
Acid-Base Excess: 1
Bicarbonate: 25.1 — ABNORMAL HIGH
O2 Saturation: 83
Operator id: 3390
Patient temperature: 31.5
TCO2: 26
pCO2, Ven: 30.6 — ABNORMAL LOW
pH, Ven: 7.5 — ABNORMAL HIGH
pO2, Ven: 31

## 2011-07-28 LAB — HEPARIN LEVEL (UNFRACTIONATED)
Heparin Unfractionated: 0.32
Heparin Unfractionated: 0.62
Heparin Unfractionated: 0.73 — ABNORMAL HIGH
Heparin Unfractionated: 0.83 — ABNORMAL HIGH

## 2011-07-28 LAB — POCT I-STAT CREATININE
Creatinine, Ser: 1
Operator id: 272551

## 2011-07-28 LAB — COMPREHENSIVE METABOLIC PANEL
ALT: 17
AST: 18
Albumin: 4
Alkaline Phosphatase: 89
BUN: 15
CO2: 30
Calcium: 9.3
Chloride: 102
Creatinine, Ser: 0.97
GFR calc Af Amer: >60
GFR calc non Af Amer: >60
Glucose, Bld: 112 — ABNORMAL HIGH
Potassium: 4.7
Sodium: 140
Total Bilirubin: 0.7
Total Protein: 7.1

## 2011-07-28 LAB — CK TOTAL AND CKMB (NOT AT ARMC)
CK, MB: 3.3
CK, MB: 7.8 — ABNORMAL HIGH
Relative Index: 3.2 — ABNORMAL HIGH
Relative Index: 5.7 — ABNORMAL HIGH
Total CK: 104
Total CK: 138

## 2011-07-28 LAB — PREPARE PLATELET PHERESIS

## 2011-07-28 LAB — HEMOGLOBIN A1C
Hgb A1c MFr Bld: 5.7
Mean Plasma Glucose: 126

## 2011-07-28 LAB — POCT CARDIAC MARKERS
CKMB, poc: 1.3
Myoglobin, poc: 93.3
Operator id: 272551
Troponin i, poc: <0.05

## 2011-07-28 LAB — MAGNESIUM: Magnesium: 2.7 — ABNORMAL HIGH

## 2011-07-28 LAB — TROPONIN I: Troponin I: 0.49 — ABNORMAL HIGH

## 2011-07-28 LAB — TYPE AND SCREEN
ABO/RH(D): B POS
Antibody Screen: NEGATIVE

## 2011-07-28 LAB — PLATELET COUNT: Platelets: 170

## 2011-07-28 LAB — PROTIME-INR
INR: 1.4
INR: 1.4
Prothrombin Time: 17 — ABNORMAL HIGH
Prothrombin Time: 17.2 — ABNORMAL HIGH

## 2011-07-28 LAB — ABO/RH: ABO/RH(D): B POS

## 2011-07-28 LAB — HEMOGLOBIN AND HEMATOCRIT, BLOOD
HCT: 22.7 — ABNORMAL LOW
Hemoglobin: 7.9 — CL

## 2011-07-28 LAB — TSH: TSH: 0.95

## 2011-07-28 LAB — D-DIMER, QUANTITATIVE (NOT AT ARMC): D-Dimer, Quant: <0.22

## 2011-10-24 ENCOUNTER — Other Ambulatory Visit: Payer: Self-pay | Admitting: *Deleted

## 2011-10-24 MED ORDER — HYDROCHLOROTHIAZIDE 12.5 MG PO CAPS: 12.5000 mg | ORAL_CAPSULE | Freq: Every day | ORAL | Status: DC

## 2012-01-20 ENCOUNTER — Encounter: Payer: Self-pay | Admitting: Family Medicine

## 2012-01-20 ENCOUNTER — Ambulatory Visit (INDEPENDENT_AMBULATORY_CARE_PROVIDER_SITE_OTHER): Payer: Managed Care, Other (non HMO) | Admitting: Family Medicine

## 2012-01-20 VITALS — BP 138/87 | HR 73 | Temp 98.3°F | Ht 63.0 in | Wt 188.4 lb

## 2012-01-20 DIAGNOSIS — L309 Dermatitis, unspecified: Secondary | ICD-10-CM

## 2012-01-20 DIAGNOSIS — L259 Unspecified contact dermatitis, unspecified cause: Secondary | ICD-10-CM

## 2012-01-20 DIAGNOSIS — B86 Scabies: Secondary | ICD-10-CM | POA: Insufficient documentation

## 2012-01-20 MED ORDER — PREDNISONE 20 MG PO TABS: ORAL_TABLET | ORAL | Status: DC

## 2012-01-20 MED ORDER — CLOTRIMAZOLE-BETAMETHASONE 1-0.05 % EX CREA: 1.0000 "application " | TOPICAL_CREAM | Freq: Two times a day (BID) | CUTANEOUS | Status: DC

## 2012-01-20 MED ORDER — PERMETHRIN 5 % EX CREA: TOPICAL_CREAM | Freq: Once | CUTANEOUS | Status: AC

## 2012-01-20 NOTE — Patient Instructions (Signed)
Schedule your complete physical at your convenience Start the Prednisone- 2 tabs daily at the same time w/ food- for the itching Apply the Elimite from your chin down (1/2 tube)- sleep in it, wash it off.  Wash everything- sheets, towels, clothes.  Reapply in 1 week if symptoms persist Apply the steroid cream twice daily to the large patch on your leg Call with any questions or concerns Hang in there!!!

## 2012-01-20 NOTE — Progress Notes (Signed)
  Subjective:    Patient ID: Kevin Guzman, male    DOB: Sep 13, 1951, 61 y.o.   MRN: [id number]  HPI Skin problem- reports sxs started on R pinky finger 'as an itch'.  Was given ointment by MD and things somewhat improved.  Now w/ large irritated patch on R medial calf, very itchy.  Has multiple scabbed, excoriated areas on arms, back.  Will take benadryl and use moisturizer w/ some relief.  Wife has some itching on hands.   Review of Systems For ROS see HPI     Objective:   Physical Exam  Vitals reviewed. Constitutional: He appears well-developed and well-nourished. No distress.  Skin: Skin is warm and dry. Rash (R calf w/ eczematous patch but rash on lower back and extremities more consistent w/ excoriations and scabies) noted.          Assessment & Plan:

## 2012-02-01 NOTE — Assessment & Plan Note (Signed)
New.  Patch on R lower leg consistent w/ eczema.  Start topical steroid.  Reviewed supportive care and red flags that should prompt return.  Pt expressed understanding and is in agreement w/ plan.

## 2012-02-01 NOTE — Assessment & Plan Note (Signed)
New.  Pt's rash on low back and extremities appears consistent w/ scabies infxn.  Start Elimite.  Reviewed supportive care and red flags that should prompt return.  Pt expressed understanding and is in agreement w/ plan.

## 2012-02-09 ENCOUNTER — Encounter: Payer: Self-pay | Admitting: *Deleted

## 2012-02-09 ENCOUNTER — Telehealth: Payer: Self-pay | Admitting: *Deleted

## 2012-02-09 DIAGNOSIS — I2510 Atherosclerotic heart disease of native coronary artery without angina pectoris: Secondary | ICD-10-CM

## 2012-02-09 DIAGNOSIS — E785 Hyperlipidemia, unspecified: Secondary | ICD-10-CM

## 2012-02-09 NOTE — Telephone Encounter (Signed)
Placed lab orders for cmet and lipid to be done in 05/2012 when pt sees Dr. Excell Seltzer. I sent out letter today to pt tcb and make appt since #'s are not valid, recording says disconnected or change but no other # available

## 2012-02-09 NOTE — Telephone Encounter (Signed)
Message copied by Tarri Fuller on Mon Feb 09, 2012 11:05 AM ------      Message from: [place], [place] T      Created: Mon Feb 09, 2012  9:32 AM       Never got CMET and Lipids last April.      Should be seeing Dr. Excell Seltzer this summer for follow up (? 05/2012).      Just have him get labs when he comes in for Dr. Excell Seltzer appt.      Thanks      Tereso Newcomer, [place]  9:33 AM 02/09/2012                    ----- Message -----         From: SYSTEM         Sent: 02/09/2012  12:02 AM           To: Beatrice Lecher, PA

## 2012-02-09 NOTE — Progress Notes (Signed)
Placed lab orders for cmet and lipid to be done in 05/2012 when pt sees Dr. Cooper. I sent out letter today to pt tcb and make appt since #'s are not valid, recording says disconnected or change but no other # available 

## 2012-03-01 ENCOUNTER — Encounter: Payer: Self-pay | Admitting: Family Medicine

## 2012-03-01 ENCOUNTER — Ambulatory Visit (INDEPENDENT_AMBULATORY_CARE_PROVIDER_SITE_OTHER): Payer: Managed Care, Other (non HMO) | Admitting: Family Medicine

## 2012-03-01 ENCOUNTER — Telehealth: Payer: Self-pay | Admitting: *Deleted

## 2012-03-01 VITALS — BP 130/75 | HR 74 | Temp 98.6°F | Ht 63.0 in | Wt 185.0 lb

## 2012-03-01 DIAGNOSIS — R21 Rash and other nonspecific skin eruption: Secondary | ICD-10-CM

## 2012-03-01 MED ORDER — METHYLPREDNISOLONE ACETATE 80 MG/ML IJ SUSP
80.0000 mg | Freq: Once | INTRAMUSCULAR | Status: AC
  Administered 2012-03-01: 80 mg via INTRAMUSCULAR

## 2012-03-01 MED ORDER — PREDNISONE 20 MG PO TABS: ORAL_TABLET | ORAL | Status: DC

## 2012-03-01 NOTE — Progress Notes (Signed)
  Subjective:    Patient ID: Kevin Guzman, male    DOB: [birth year], 61 y.o.   MRN: [id number]  HPI Skin rash- was seen on 4/9 w/ what was thought to be combo of scabies and eczema.  Pt reports eczema cream works well when he uses it.  Prednisone helped while he was taking it but once meds were done, itch and rash worsened.  Pt feels sxs improved w/ elimite cream- waited 3 weeks to repeat tx and after 2nd application, sxs didn't improve.   Review of Systems For ROS see HPI     Objective:   Physical Exam  Vitals reviewed. Constitutional: He appears well-developed and well-nourished. No distress.  Skin: Skin is warm and dry. Rash (diffuse excoriated areas on upper arms, lower back, R buttock.  appears consistent w/ bites/mites) noted.          Assessment & Plan:

## 2012-03-01 NOTE — Telephone Encounter (Signed)
We have no evidence that pt has low T- this is the ONLY indication for testosterone replacement.  There is an increased risk of prostate enlargement and cancer w/ testosterone tx.  He should not take this unless we have an indication of a deficiency- and even then, he needs his prostate levels followed closely.  Pt needs to schedule CPE

## 2012-03-01 NOTE — Telephone Encounter (Signed)
Pt came into office for OV and wanted to discuss further questions with MD Beverely Low, advised MD Beverely Low has moved onto other pt's per apt's and that I will send speak to her based on his concerns and get back with him asap, pt understood   1. Pt notes given Androgel previously per depression however his new insurance will not pay for it, what alternatives are available at a cheaper price 2. Does testosterone promote prostate cancer, he has heard arguments on both sides 3. Wants to know about any negatives concerning taking testosterone

## 2012-03-01 NOTE — Patient Instructions (Signed)
Start the Prednisone in the AM (w/ food) We'll call you with your dermatology appt Apply the Triamcinolone cream twice daily Call with any questions or concerns Hang in there!!

## 2012-03-01 NOTE — Assessment & Plan Note (Signed)
New.  Pt was treated w/ steroids, anti-mite tx x2.  sxs now worse than previous.  Refer to derm.  Restart steroids.

## 2012-03-02 NOTE — Telephone Encounter (Signed)
Called pt to advise MD Tabori instructions however noted pt number is incorrect in the chart, called pt spouse mobile listed in the chart and left vm on spouse line to have pt contact our office, also need to update phone number in chart

## 2012-03-03 NOTE — Telephone Encounter (Signed)
Noted home number in chart does not belong to the pt, erased number in chart, noted spouse cell number is correct, left vm for pt to call office

## 2012-03-11 NOTE — Telephone Encounter (Signed)
Called pt via spouse number, she advised pt is at work, received new mobile number for pt [phone] placed in chart and pt wife received our office number and will have pt call our office when he comes home from work.

## 2012-03-23 NOTE — Telephone Encounter (Signed)
Several attempts have been made to contact pt with no resolve, sent letter to pt address noted in chart with results/instructions/prescriptions. Advised pt to call office if any questions or concerns per letter.  

## 2012-06-04 ENCOUNTER — Encounter: Payer: Self-pay | Admitting: *Deleted

## 2012-06-21 ENCOUNTER — Encounter: Payer: Self-pay | Admitting: Cardiovascular Disease

## 2012-06-21 ENCOUNTER — Ambulatory Visit (INDEPENDENT_AMBULATORY_CARE_PROVIDER_SITE_OTHER): Payer: Managed Care, Other (non HMO) | Admitting: Cardiovascular Disease

## 2012-06-21 VITALS — BP 122/84 | HR 61 | Ht 63.0 in | Wt 184.8 lb

## 2012-06-21 DIAGNOSIS — I10 Essential (primary) hypertension: Secondary | ICD-10-CM

## 2012-06-21 DIAGNOSIS — I2510 Atherosclerotic heart disease of native coronary artery without angina pectoris: Secondary | ICD-10-CM

## 2012-06-21 DIAGNOSIS — E785 Hyperlipidemia, unspecified: Secondary | ICD-10-CM

## 2012-06-21 NOTE — Assessment & Plan Note (Signed)
He is on high-dose atorvastatin. He is overdue for lipids and LFTs, but he has not been on Lipitor for a few weeks. Recommend resume Lipitor 80 mg daily and followup lipids and LFTs in 8 weeks.

## 2012-06-21 NOTE — Progress Notes (Signed)
   HPI:  61 year old gentleman presenting for followup evaluation. The patient has coronary artery disease and initially presented in 2008 with a non-ST elevation infarction. He had severe 2 vessel coronary artery disease and underwent coronary bypass surgery. He is also followed for hypertension hyperlipidemia.  Overall the patient is doing well. He has no chest pain, chest pressure, dyspnea, edema, or palpitations. He's had sharp pains in his calf area, but these are unrelated to walking or physical exertion. He's had no calf swelling or tenderness to palpation.  Patient ran out of his Lipitor a few weeks ago and so he has not been taking this medicine. He's otherwise been compliant with his medications.  Outpatient Encounter Prescriptions as of 06/21/2012  Medication Sig Dispense Refill  . albuterol (VENTOLIN HFA) 108 (90 BASE) MCG/ACT inhaler Inhale 2 puffs into the lungs every 6 (six) hours as needed.        Marland Kitchen aspirin 81 MG EC tablet Take 81 mg by mouth daily.        Marland Kitchen atorvastatin (LIPITOR) 80 MG tablet Take 80 mg by mouth daily.      . clotrimazole-betamethasone (LOTRISONE) cream Apply 1 application topically 2 (two) times daily.  45 g  1  . hydrochlorothiazide (MICROZIDE) 12.5 MG capsule Take 1 capsule (12.5 mg total) by mouth daily.  30 capsule  3  . Melatonin 3 MG CAPS Take 1 capsule by mouth at bedtime.       . metoprolol tartrate (LOPRESSOR) 25 MG tablet TAKE 1 TABLET BY MOUTH EVERY 12 HOURS  60 tablet  5  . DISCONTD: permethrin (ELIMITE) 5 % cream       . DISCONTD: predniSONE (DELTASONE) 20 MG tablet 2 tabs daily x10 days  20 tablet  0  . DISCONTD: Testosterone 1.25 GM/ACT (1%) GEL Place onto the skin.         No Known Allergies  Past Medical History  Diagnosis Date  . CAD (coronary artery disease)     a. 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 EF 65% at cath 8/08  . HTN (hypertension)   . HLD (hyperlipidemia)   . Asthma   . Testosterone deficiency       ROS: Negative except as per HPI  BP 122/84  Pulse 61  Ht 5\' 3"  (1.6 m)  Wt 83.825 kg (184 lb 12.8 oz)  BMI 32.74 kg/m2  PHYSICAL EXAM: Pt is alert and oriented, NAD HEENT: normal Neck: JVP - normal, carotids 2+= without bruits Lungs: CTA bilaterally CV: RRR without murmur or gallop Abd: soft, NT, Positive BS, no hepatomegaly Ext: no C/C/E, distal pulses intact and equal Skin: warm/dry no rash  EKG:  Normal sinus rhythm 61 beats per minute, nonspecific T wave abnormality  ASSESSMENT AND PLAN:

## 2012-06-21 NOTE — Patient Instructions (Addendum)
Your physician recommends that you return for a FASTING LIPID, LIVER and BMP in 2 MONTHS--nothing to eat or drink after midnight, lab opens at 8:30.  Your physician wants you to follow-up in:  1 YEAR. You will receive a reminder letter in the mail two months in advance. If you don't receive a letter, please call our office to schedule the follow-up appointment.  Your physician recommends that you continue on your current medications as directed. Please refer to the Current Medication list given to you today.

## 2012-06-21 NOTE — Assessment & Plan Note (Signed)
Stable without anginal symptoms. The patient is on appropriate medications with aspirin, a statin drug, and a beta blocker. His left ventricular function is normal. He will followup in 12 months. His EKG was reviewed and is essentially within normal limits.

## 2012-06-21 NOTE — Assessment & Plan Note (Signed)
Blood pressure is controlled on metoprolol and hydrochlorothiazide. 

## 2012-07-02 ENCOUNTER — Other Ambulatory Visit: Payer: Self-pay | Admitting: Cardiovascular Disease

## 2012-08-16 ENCOUNTER — Other Ambulatory Visit: Payer: Self-pay | Admitting: Cardiovascular Disease

## 2012-08-23 ENCOUNTER — Other Ambulatory Visit: Payer: Managed Care, Other (non HMO)

## 2012-09-12 ENCOUNTER — Other Ambulatory Visit: Payer: Self-pay | Admitting: Cardiovascular Disease

## 2012-09-14 ENCOUNTER — Other Ambulatory Visit: Payer: Self-pay | Admitting: *Deleted

## 2012-09-14 MED ORDER — METOPROLOL TARTRATE 25 MG PO TABS: 25.0000 mg | ORAL_TABLET | Freq: Two times a day (BID) | ORAL | Status: DC

## 2012-09-19 ENCOUNTER — Other Ambulatory Visit: Payer: Self-pay | Admitting: Cardiovascular Disease

## 2012-09-20 ENCOUNTER — Telehealth: Payer: Self-pay | Admitting: Cardiovascular Disease

## 2012-09-20 MED ORDER — HYDROCHLOROTHIAZIDE 12.5 MG PO CAPS: 12.5000 mg | ORAL_CAPSULE | Freq: Every day | ORAL | Status: DC

## 2012-09-20 NOTE — Telephone Encounter (Signed)
New message:  Pt is out of hydrocholoridze/  Needs this called into [facility]/[address]/ ASAP

## 2012-09-21 ENCOUNTER — Other Ambulatory Visit: Payer: Self-pay

## 2012-09-21 MED ORDER — HYDROCHLOROTHIAZIDE 12.5 MG PO CAPS: 12.5000 mg | ORAL_CAPSULE | Freq: Every day | ORAL | Status: DC

## 2012-11-15 ENCOUNTER — Other Ambulatory Visit: Payer: Managed Care, Other (non HMO)

## 2012-11-27 ENCOUNTER — Other Ambulatory Visit: Payer: Self-pay

## 2012-12-30 ENCOUNTER — Encounter: Payer: Self-pay | Admitting: Family Medicine

## 2012-12-30 ENCOUNTER — Ambulatory Visit (INDEPENDENT_AMBULATORY_CARE_PROVIDER_SITE_OTHER): Payer: Self-pay | Admitting: Family Medicine

## 2012-12-30 VITALS — BP 110/80 | HR 76 | Temp 97.9°F | Ht 64.25 in | Wt 196.4 lb

## 2012-12-30 DIAGNOSIS — J45909 Unspecified asthma, uncomplicated: Secondary | ICD-10-CM

## 2012-12-30 DIAGNOSIS — J069 Acute upper respiratory infection, unspecified: Secondary | ICD-10-CM

## 2012-12-30 MED ORDER — PREDNISONE 5 MG PO TABS: ORAL_TABLET | ORAL | Status: DC

## 2012-12-30 MED ORDER — AMOXICILLIN 500 MG PO CAPS: 500.0000 mg | ORAL_CAPSULE | Freq: Three times a day (TID) | ORAL | Status: DC

## 2012-12-30 NOTE — Progress Notes (Signed)
  Subjective:    Patient ID: Kevin Guzman, male    DOB: 10/08/1951, 62 y.o.   MRN: [id number]  HPI URI- sxs started ~1 week ago.  + productive cough.  Wheezing at night when lying down.  Minimal nasal congestion.  No facial pain/pressure.  No ear pain.  No fevers.  No N/V/D.  + sick contacts.  Hx of asthma.  Using albuterol w/ some relief.     Review of Systems For ROS see HPI     Objective:   Physical Exam  Vitals reviewed. Constitutional: He appears well-developed and well-nourished. No distress.  HENT:  Head: Normocephalic and atraumatic.  Right Ear: Tympanic membrane normal.  Left Ear: Tympanic membrane normal.  Nose: No mucosal edema or rhinorrhea. Right sinus exhibits no maxillary sinus tenderness and no frontal sinus tenderness. Left sinus exhibits no maxillary sinus tenderness and no frontal sinus tenderness.  Mouth/Throat: Mucous membranes are normal. No oropharyngeal exudate, posterior oropharyngeal edema or posterior oropharyngeal erythema.  Eyes: Conjunctivae and EOM are normal. Pupils are equal, round, and reactive to light.  Neck: Normal range of motion. Neck supple.  Cardiovascular: Normal rate, regular rhythm and normal heart sounds.   Pulmonary/Chest: Effort normal. No respiratory distress. He has wheezes (diffuse expiratory wheezes w/ prolonged expiratory phase).  + hacking cough  Lymphadenopathy:    He has no cervical adenopathy.  Skin: Skin is warm and dry.          Assessment & Plan:

## 2012-12-30 NOTE — Patient Instructions (Addendum)
Start the Amox 3x/day for the bronchitis Continue the Albuterol inhaler as needed for wheezing Take the Prednisone as directed- take w/ food and take all pills at same time Mucinex DM to thin congestion and improve cough (OTC) Drink plenty of fluids REST! Hang in there!

## 2013-01-02 NOTE — Assessment & Plan Note (Signed)
Deteriorated.  Pt having acute asthma exacerbation triggered by URI.  Start pred taper- dose adjusted to fit w/ $4 drug availability.  Encouraged use of albuterol PRN.  Reviewed supportive care and red flags that should prompt return.  Pt expressed understanding and is in agreement w/ plan.

## 2013-01-02 NOTE — Assessment & Plan Note (Signed)
Pt's sxs consistent w/ infxn.  Start abx due to asthma.  Reviewed supportive care and red flags that should prompt return.  Pt expressed understanding and is in agreement w/ plan.

## 2013-01-03 ENCOUNTER — Telehealth: Payer: Self-pay | Admitting: Family Medicine

## 2013-01-03 MED ORDER — ALBUTEROL SULFATE HFA 108 (90 BASE) MCG/ACT IN AERS: 2.0000 | INHALATION_SPRAY | Freq: Four times a day (QID) | RESPIRATORY_TRACT | Status: DC | PRN

## 2013-01-03 NOTE — Telephone Encounter (Signed)
RX sent electronically, patient informed

## 2013-01-03 NOTE — Telephone Encounter (Signed)
Ok to refill inhaler x3

## 2013-01-03 NOTE — Telephone Encounter (Signed)
Patient Information:  Caller Name: [name]  Phone: [phone]  Patient: Kevin Guzman, Kevin Guzman  Gender: Male  DOB: 1951/05/23  Age: 62 Years  PCP: Sheliah Hatch.  Office Follow Up:  Does the office need to follow up with this patient?: Yes  Instructions For The Office: OFFICE PLEASE FOLLOW UP WITH PT REGARDING VENTOLIN INHALER REFILL  RN Note:  Pt is requesting Ventolin refill to be sent to [facility] at the corner of [address] and [address]  [phone]  Symptoms  Reason For Call & Symptoms: pt reports he was seen in the office on 12/30/12 for bronchitis.  Pt is calling for a refill of Ventolin.  Pt denied any symptoms to triage; pt states he is getting better but is almost out of inhaler.  Reviewed Health History In EMR: Yes  Reviewed Medications In EMR: Yes  Reviewed Allergies In EMR: Yes  Reviewed Surgeries / Procedures: Yes  Date of Onset of Symptoms: 12/30/2012  Treatments Tried: Prednisone, Amoxicillin  Treatments Tried Worked: No  Guideline(s) Used:  No Protocol Available - Sick Adult  Disposition Per Guideline:   Discuss with PCP and Callback by Nurse Today  Reason For Disposition Reached:   Nursing judgment  Advice Given:  N/A  Patient Will Follow Care Advice:  YES

## 2013-01-14 ENCOUNTER — Telehealth: Payer: Self-pay | Admitting: Family Medicine

## 2013-01-14 ENCOUNTER — Encounter: Payer: Self-pay | Admitting: Family Medicine

## 2013-01-14 ENCOUNTER — Ambulatory Visit (INDEPENDENT_AMBULATORY_CARE_PROVIDER_SITE_OTHER): Payer: Self-pay | Admitting: Family Medicine

## 2013-01-14 VITALS — BP 128/70 | HR 79 | Temp 98.3°F | Ht 64.25 in | Wt 194.6 lb

## 2013-01-14 DIAGNOSIS — J45909 Unspecified asthma, uncomplicated: Secondary | ICD-10-CM

## 2013-01-14 DIAGNOSIS — J309 Allergic rhinitis, unspecified: Secondary | ICD-10-CM

## 2013-01-14 DIAGNOSIS — R062 Wheezing: Secondary | ICD-10-CM

## 2013-01-14 MED ORDER — FLUTICASONE PROPIONATE HFA 110 MCG/ACT IN AERO: 1.0000 | INHALATION_SPRAY | Freq: Two times a day (BID) | RESPIRATORY_TRACT | Status: DC

## 2013-01-14 MED ORDER — HYDROCHLOROTHIAZIDE 12.5 MG PO CAPS: 12.5000 mg | ORAL_CAPSULE | Freq: Every day | ORAL | Status: DC

## 2013-01-14 MED ORDER — IPRATROPIUM BROMIDE 0.02 % IN SOLN
0.5000 mg | Freq: Once | RESPIRATORY_TRACT | Status: AC
  Administered 2013-01-14: 0.5 mg via RESPIRATORY_TRACT

## 2013-01-14 MED ORDER — ALBUTEROL SULFATE (5 MG/ML) 0.5% IN NEBU
2.5000 mg | INHALATION_SOLUTION | Freq: Once | RESPIRATORY_TRACT | Status: AC
  Administered 2013-01-14: 2.5 mg via RESPIRATORY_TRACT

## 2013-01-14 NOTE — Telephone Encounter (Signed)
Patient seen MD today

## 2013-01-14 NOTE — Telephone Encounter (Signed)
Patient Information:  Caller Name: Kevin Guzman  Phone: [phone]  Patient: Kevin Guzman, Kevin Guzman  Gender: Male  DOB: November 04, 1950  Age: 62 Years  PCP: Sheliah Hatch.  Office Follow Up:  Does the office need to follow up with this patient?: No  Instructions For The Office: N/A  RN Note:  pt states he is really uncomfortable with lying down  Symptoms  Reason For Call & Symptoms: cough and chest congestion.  Pt was seen on 12/30/12 for similar symptoms.  Pt states he got better for about  Week, then symptoms returned.  Pt is afebrile.   Reviewed Health History In EMR: Yes  Reviewed Medications In EMR: Yes  Reviewed Allergies In EMR: Yes  Reviewed Surgeries / Procedures: Yes  Date of Onset of Symptoms: 01/07/2013  Guideline(s) Used:  Cough  Disposition Per Guideline:   Go to Office Now  Reason For Disposition Reached:   Wheezing is present  Advice Given:  N/A  Patient Will Follow Care Advice:  YES  Appointment Scheduled:  01/14/2013 13:15:00 Appointment Scheduled Provider:  Sheliah Hatch.

## 2013-01-14 NOTE — Progress Notes (Signed)
  Subjective:    Patient ID: Najeh Credit, male    DOB: 15-Mar-1951, 62 y.o.   MRN: [id number]  HPI URI- pt was seen a few week ago for similar.  Was feeling better and 'then i went back'.  For 2-3 days has had increased cough, SOB, wheezing.  Difficulty lying down.  + nasal congestion.  Cough is productive of yellow/clear sputum.  No fevers.  No ear pain.  + sinus pressure- particularly w/ coughing.  No N/V/D.  Has been albuterol inhaler regularly.  Not currently on allergy meds.   Review of Systems For ROS see HPI     Objective:   Physical Exam  Vitals reviewed. Constitutional: He appears well-developed and well-nourished. No distress.  HENT:  Head: Normocephalic and atraumatic.  No TTP over sinuses + turbinate edema + PND TMs normal bilaterally  Eyes: Conjunctivae and EOM are normal. Pupils are equal, round, and reactive to light.  Neck: Normal range of motion. Neck supple.  Cardiovascular: Normal rate, regular rhythm and normal heart sounds.   Pulmonary/Chest: Effort normal. No respiratory distress. He has wheezes (diffuse end expiratory wheezes w/ prolonged expiration).  Lymphadenopathy:    He has no cervical adenopathy.  Skin: Skin is warm and dry.          Assessment & Plan:

## 2013-01-14 NOTE — Assessment & Plan Note (Signed)
Deteriorated.  Pt is requiring regular albuterol use.  Start daily controller ICS.  Sample given.  Reviewed supportive care and red flags that should prompt return.  Pt expressed understanding and is in agreement w/ plan.

## 2013-01-14 NOTE — Assessment & Plan Note (Signed)
New.  Pt's allergies are likely triggering his asthma.  Start daily antihistamine.  Reviewed supportive care and red flags that should prompt return.  Pt expressed understanding and is in agreement w/ plan.

## 2013-01-14 NOTE — Patient Instructions (Addendum)
This appears to be all allergy/asthma related Start the Flovent- 1 puff twice daily for the next month Use the Albuterol as needed Start Claritin or Zyrtec daily for allergies (store brand works just as good) Drink plenty of fluids Hang in there!

## 2013-03-22 ENCOUNTER — Other Ambulatory Visit: Payer: Self-pay | Admitting: *Deleted

## 2013-03-22 MED ORDER — ATORVASTATIN CALCIUM 80 MG PO TABS: 80.0000 mg | ORAL_TABLET | Freq: Every day | ORAL | Status: DC

## 2013-03-24 ENCOUNTER — Other Ambulatory Visit: Payer: Self-pay | Admitting: Cardiovascular Disease

## 2013-06-30 ENCOUNTER — Ambulatory Visit (INDEPENDENT_AMBULATORY_CARE_PROVIDER_SITE_OTHER): Payer: Medicaid Other | Admitting: Cardiovascular Disease

## 2013-06-30 ENCOUNTER — Encounter: Payer: Self-pay | Admitting: Cardiovascular Disease

## 2013-06-30 VITALS — BP 122/74 | HR 57 | Ht 64.0 in | Wt 197.0 lb

## 2013-06-30 DIAGNOSIS — I2510 Atherosclerotic heart disease of native coronary artery without angina pectoris: Secondary | ICD-10-CM

## 2013-06-30 DIAGNOSIS — E785 Hyperlipidemia, unspecified: Secondary | ICD-10-CM

## 2013-06-30 NOTE — Patient Instructions (Addendum)
Your physician recommends that you return for a FASTING LIPID, LIVER, BMP and CBC--nothing to eat or drink after midnight, lab opens at 7:30 on 07/01/13  Your physician recommends that you continue on your current medications as directed. Please refer to the Current Medication list given to you today.  Your physician wants you to follow-up in: 1 YEAR with Dr Excell Seltzer.  You will receive a reminder letter in the mail two months in advance. If you don't receive a letter, please call our office to schedule the follow-up appointment.

## 2013-06-30 NOTE — Progress Notes (Signed)
HPI:   62 year old gentleman presenting for followup evaluation. The patient has coronary artery disease and initially presented in 2008 with a non-ST elevation infarction. He had severe 2 vessel coronary artery disease and underwent coronary bypass surgery. He is also followed for hypertension and hyperlipidemia.  The patient is doing well from a cardiac perspective. He denies chest pain, chest pressure, dyspnea, palpitations, orthopnea, or PND. He's been active on the weekends but does not engage in any regular exercise. He denies any symptoms with exertion. He has been taking his medications regularly.   Outpatient Encounter Prescriptions as of 06/30/2013  Medication Sig Dispense Refill  . albuterol (VENTOLIN HFA) 108 (90 BASE) MCG/ACT inhaler Inhale 2 puffs into the lungs every 6 (six) hours as needed.  8.5 g  3  . aspirin 81 MG EC tablet Take 81 mg by mouth daily.        Marland Kitchen atorvastatin (LIPITOR) 80 MG tablet TAKE ONE TABLET BY MOUTH EVERY DAY  30 tablet  1  . clotrimazole-betamethasone (LOTRISONE) cream Apply 1 application topically 2 (two) times daily.  45 g  1  . hydrochlorothiazide (MICROZIDE) 12.5 MG capsule Take 1 capsule (12.5 mg total) by mouth daily.  30 capsule  5  . Melatonin 5 MG CAPS Take by mouth daily.      . metoprolol tartrate (LOPRESSOR) 25 MG tablet Take 1 tablet (25 mg total) by mouth every 12 (twelve) hours.  60 tablet  3  . [DISCONTINUED] amoxicillin (AMOXIL) 500 MG capsule Take 1 capsule (500 mg total) by mouth 3 (three) times daily.  30 capsule  0  . [DISCONTINUED] fluticasone (FLOVENT HFA) 110 MCG/ACT inhaler Inhale 1 puff into the lungs 2 (two) times daily.  1 Inhaler  12  . [DISCONTINUED] Melatonin 3 MG CAPS Take 1 capsule by mouth at bedtime.       . [DISCONTINUED] predniSONE (DELTASONE) 5 MG tablet 6 tabs daily x3 days, then 4 tabs x2 days, then 2 tabs x2 days.  Take w/ food  30 tablet  0   No facility-administered encounter medications on file as of 06/30/2013.     No Known Allergies  Past Medical History  Diagnosis Date  . CAD (coronary artery disease)     a. 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 EF 65% at cath 8/08  . HTN (hypertension)   . HLD (hyperlipidemia)   . Asthma   . Testosterone deficiency     ROS: Negative except as per HPI  BP 122/74  Pulse 57  Ht 5\' 4"  (1.626 m)  Wt 197 lb (89.359 kg)  BMI 33.8 kg/m2  SpO2 97%  PHYSICAL EXAM: Pt is alert and oriented, NAD HEENT: normal Neck: JVP - normal, carotids 2+= without bruits Lungs: CTA bilaterally CV: RRR without murmur or gallop Abd: soft, NT, Positive BS, no hepatomegaly Ext: no C/C/E, distal pulses intact and equal Skin: warm/dry no rash  EKG:  Sinus bradycardia 57 beats per minute, otherwise within normal limits.  ASSESSMENT AND PLAN: 1. Coronary artery disease, native vessel. The patient is stable without anginal symptoms. He is on appropriate medications with aspirin, a beta blocker, and atorvastatin at high dose. I encouraged him to initiate a regular exercise program and aim for weight loss.  2. Hyperlipidemia. The patient is on atorvastatin 80 mg. Will check lipids and LFTs tomorrow. He is over due for lipids.  3. Hypertension. Blood pressure is well controlled on metoprolol and hydrochlorothiazide.  Tonny Bollman 06/30/2013 9:30 AM

## 2013-07-01 ENCOUNTER — Other Ambulatory Visit (INDEPENDENT_AMBULATORY_CARE_PROVIDER_SITE_OTHER): Payer: Medicaid Other

## 2013-07-01 DIAGNOSIS — I2510 Atherosclerotic heart disease of native coronary artery without angina pectoris: Secondary | ICD-10-CM

## 2013-07-01 DIAGNOSIS — E785 Hyperlipidemia, unspecified: Secondary | ICD-10-CM

## 2013-07-01 LAB — LIPID PANEL
Cholesterol: 179 mg/dL (ref 0–200)
HDL: 47.1 mg/dL (ref >39.00)
LDL Cholesterol: 109 mg/dL — ABNORMAL HIGH (ref 0–99)
Total CHOL/HDL Ratio: 4
Triglycerides: 116 mg/dL (ref 0.0–149.0)
VLDL: 23.2 mg/dL (ref 0.0–40.0)

## 2013-07-01 LAB — HEPATIC FUNCTION PANEL
ALT: 19 U/L (ref 0–53)
AST: 17 U/L (ref 0–37)
Albumin: 3.9 g/dL (ref 3.5–5.2)
Alkaline Phosphatase: 92 U/L (ref 39–117)
Bilirubin, Direct: 0 mg/dL (ref 0.0–0.3)
Total Bilirubin: 0.4 mg/dL (ref 0.3–1.2)
Total Protein: 6.9 g/dL (ref 6.0–8.3)

## 2013-07-01 LAB — BASIC METABOLIC PANEL
BUN: 15 mg/dL (ref 6–23)
CO2: 31 mEq/L (ref 19–32)
Calcium: 8.6 mg/dL (ref 8.4–10.5)
Chloride: 106 mEq/L (ref 96–112)
Creatinine, Ser: 1 mg/dL (ref 0.4–1.5)
GFR: 79.89 mL/min (ref >60.00)
Glucose, Bld: 98 mg/dL (ref 70–99)
Potassium: 3.9 mEq/L (ref 3.5–5.1)
Sodium: 141 mEq/L (ref 135–145)

## 2013-07-01 LAB — CBC WITH DIFFERENTIAL/PLATELET
Basophils Absolute: 0 10*3/uL (ref 0.0–0.1)
Basophils Relative: 0.7 % (ref 0.0–3.0)
Eosinophils Absolute: 0.3 10*3/uL (ref 0.0–0.7)
Eosinophils Relative: 6 % — ABNORMAL HIGH (ref 0.0–5.0)
HCT: 40.5 % (ref 39.0–52.0)
Hemoglobin: 13.6 g/dL (ref 13.0–17.0)
Lymphocytes Relative: 25.8 % (ref 12.0–46.0)
Lymphs Abs: 1.4 10*3/uL (ref 0.7–4.0)
MCHC: 33.4 g/dL (ref 30.0–36.0)
MCV: 85.7 fl (ref 78.0–100.0)
Monocytes Absolute: 0.4 10*3/uL (ref 0.1–1.0)
Monocytes Relative: 6.6 % (ref 3.0–12.0)
Neutro Abs: 3.4 10*3/uL (ref 1.4–7.7)
Neutrophils Relative %: 60.9 % (ref 43.0–77.0)
Platelets: 255 10*3/uL (ref 150.0–400.0)
RBC: 4.73 Mil/uL (ref 4.22–5.81)
RDW: 13.8 % (ref 11.5–14.6)
WBC: 5.6 10*3/uL (ref 4.5–10.5)

## 2013-07-08 ENCOUNTER — Other Ambulatory Visit: Payer: Self-pay | Admitting: Cardiovascular Disease

## 2013-07-21 ENCOUNTER — Ambulatory Visit (INDEPENDENT_AMBULATORY_CARE_PROVIDER_SITE_OTHER)
Admission: RE | Admit: 2013-07-21 | Discharge: 2013-07-21 | Disposition: A | Discharge status: Home / Self Care | Payer: Medicaid Other | Source: Ambulatory Visit | Attending: Internal Medicine | Admitting: Internal Medicine

## 2013-07-21 ENCOUNTER — Encounter: Payer: Self-pay | Admitting: Internal Medicine

## 2013-07-21 ENCOUNTER — Ambulatory Visit (INDEPENDENT_AMBULATORY_CARE_PROVIDER_SITE_OTHER): Payer: Medicaid Other | Admitting: Internal Medicine

## 2013-07-21 VITALS — BP 138/76 | HR 73 | Temp 98.7°F | Wt 181.0 lb

## 2013-07-21 DIAGNOSIS — J45901 Unspecified asthma with (acute) exacerbation: Secondary | ICD-10-CM

## 2013-07-21 DIAGNOSIS — J441 Chronic obstructive pulmonary disease with (acute) exacerbation: Secondary | ICD-10-CM

## 2013-07-21 DIAGNOSIS — I2510 Atherosclerotic heart disease of native coronary artery without angina pectoris: Secondary | ICD-10-CM

## 2013-07-21 MED ORDER — DOXYCYCLINE HYCLATE 100 MG PO TABS: 100.0000 mg | ORAL_TABLET | Freq: Two times a day (BID) | ORAL | Status: DC

## 2013-07-21 MED ORDER — FLUTICASONE-SALMETEROL 250-50 MCG/DOSE IN AEPB: 1.0000 | INHALATION_SPRAY | Freq: Two times a day (BID) | RESPIRATORY_TRACT | Status: DC

## 2013-07-21 MED ORDER — PREDNISONE 20 MG PO TABS: 20.0000 mg | ORAL_TABLET | Freq: Two times a day (BID) | ORAL | Status: DC

## 2013-07-21 NOTE — Patient Instructions (Signed)
Advair 250/50 one  inhalations every 12 hours; gargle and spit after use. Order for x-rays entered into  the computer; these will be performed at [address] [facility]. across from [facility]. No appointment is necessary.  Mobile :[phone]

## 2013-07-21 NOTE — Progress Notes (Signed)
  Subjective:    Patient ID: Kevin Guzman, male    DOB: 11-29-1950, 62 y.o.   MRN: [id number]  HPI  Symptoms began 10 days ago as a cough which has progressed. There was no specific trigger for the flare of his asthma.  The asthma diagnosis was clarified in the problem list. Although a brother had asthma as a child; he did not. His symptoms began in 1996 after he stopped smoking. Triggers in the past have been perfumes and stress.  This episode has been associated with extrinsic symptoms of sneezing and itchy, watery eyes. He has had production of clear sputum mainly as perles or plugs. He's had scant yellow sputum.  Typically he rarely uses his albuterol and has no maintenance asthma medications. He's been using his albuterol every 4 hours for the last several days. The asthma has disrupted sleep.    Review of Systems  He specifically denies fever, chills, sweats, frontal headache, facial pain, nasal purulence, otic pain, otic discharge, or dental pain.     Objective:   Physical Exam General appearance:good health ;well nourished; no acute distress or increased work of breathing is present.  No  lymphadenopathy about the head, neck, or axilla noted.   Eyes: No conjunctival inflammation or lid edema is present.   Ears:  External ear exam shows no significant lesions or deformities.  Otoscopic examination reveals clear canals, tympanic membranes are intact bilaterally without bulging, retraction, inflammation or discharge.  Nose:  External nasal examination shows no deformity or inflammation. Nasal mucosa are pink and moist without lesions or exudates. Major septal dislocation / deviation to L..Some obstruction to airflow.  Oral exam: Dental hygiene is good; lips and gums are healthy appearing.There is no oropharyngeal erythema or exudate noted.   Neck:  No deformities,  masses, or tenderness noted.      Heart:  Normal rate and regular rhythm. S1 and S2 normal without gallop,  murmur, click, rub or other extra sounds. S4 present. Heart sounds distant, obscured by breath sounds. Heart sounds are best in the epigastrium  Lungs: Coarse wheezing in all lung fields. There appears to be asymmetry with the most significant wheezing noted of the right anterior chest. There is also wheezing emanating from the upper airway at 4 feet. Extremities:  No cyanosis, edema, or clubbing  noted    Skin: Warm & dry          Assessment & Plan:  #1 asthma exacerbation with sleep disruption and excessive use of albuterol. Asymmetric wheezing warrants chest x-ray  #2 coronary disease; the low O2 sats are significant in this context. Risk of excess beta agonists use and hypoxemia discussed.  See orders

## 2013-07-22 ENCOUNTER — Telehealth: Payer: Self-pay | Admitting: *Deleted

## 2013-07-22 ENCOUNTER — Encounter: Payer: Self-pay | Admitting: *Deleted

## 2013-07-22 NOTE — Progress Notes (Signed)
Letter mailed to patient.

## 2013-07-26 NOTE — Telephone Encounter (Signed)
error 

## 2013-08-12 ENCOUNTER — Ambulatory Visit (INDEPENDENT_AMBULATORY_CARE_PROVIDER_SITE_OTHER): Payer: Medicaid Other | Admitting: Family Medicine

## 2013-08-12 ENCOUNTER — Encounter: Payer: Self-pay | Admitting: Family Medicine

## 2013-08-12 VITALS — BP 120/80 | HR 71 | Temp 98.6°F | Resp 16 | Wt 194.2 lb

## 2013-08-12 DIAGNOSIS — J45909 Unspecified asthma, uncomplicated: Secondary | ICD-10-CM

## 2013-08-12 MED ORDER — ALBUTEROL SULFATE HFA 108 (90 BASE) MCG/ACT IN AERS: 2.0000 | INHALATION_SPRAY | Freq: Four times a day (QID) | RESPIRATORY_TRACT | Status: DC | PRN

## 2013-08-12 MED ORDER — AZITHROMYCIN 250 MG PO TABS: ORAL_TABLET | ORAL | Status: DC

## 2013-08-12 MED ORDER — IPRATROPIUM BROMIDE 0.02 % IN SOLN: 0.5000 mg | Freq: Once | RESPIRATORY_TRACT | Status: DC

## 2013-08-12 MED ORDER — BECLOMETHASONE DIPROPIONATE 80 MCG/ACT IN AERS: 1.0000 | INHALATION_SPRAY | Freq: Two times a day (BID) | RESPIRATORY_TRACT | Status: DC

## 2013-08-12 MED ORDER — ALBUTEROL SULFATE (5 MG/ML) 0.5% IN NEBU
2.5000 mg | INHALATION_SOLUTION | Freq: Once | RESPIRATORY_TRACT | Status: AC
  Administered 2013-08-12: 2.5 mg via RESPIRATORY_TRACT

## 2013-08-12 NOTE — Patient Instructions (Signed)
Follow up as needed Start the Zpack as directed Continue the albuterol as needed Add the Qvar-1 puff twice daily- to control asthma symptoms every day! Drink plenty of fluids REST! Hang in there!!!

## 2013-08-12 NOTE — Progress Notes (Signed)
  Subjective:    Patient ID: Kevin Guzman, male    DOB: [birth year], 62 y.o.   MRN: [id number]  HPI Asthma- chronic problem, worsened yesterday.  Increased wheezing, SOB.  No fever.  + cough- productive.  Using albuterol regularly.  Was previously on Advair but 'ran out 1 week ago'.  Not currently on controller medication.  No ear pain, no sinus pressure.  No known sick contacts.   Review of Systems For ROS see HPI     Objective:   Physical Exam  Vitals reviewed. Constitutional: He appears well-developed and well-nourished. No distress.  HENT:  Head: Normocephalic and atraumatic.  Right Ear: Tympanic membrane normal.  Left Ear: Tympanic membrane normal.  Nose: No mucosal edema or rhinorrhea. Right sinus exhibits no maxillary sinus tenderness and no frontal sinus tenderness. Left sinus exhibits no maxillary sinus tenderness and no frontal sinus tenderness.  Mouth/Throat: Mucous membranes are normal. No oropharyngeal exudate, posterior oropharyngeal edema or posterior oropharyngeal erythema.  Eyes: Conjunctivae and EOM are normal. Pupils are equal, round, and reactive to light.  Neck: Normal range of motion. Neck supple.  Cardiovascular: Normal rate, regular rhythm and normal heart sounds.   Pulmonary/Chest: Effort normal. No respiratory distress. He has wheezes (diffuse inspiratory and expiratory wheezes w/ poor air movement.  s/p neb tx- air movement improved, no inspiratory wheezes, scattered expiratory wheezes).  + hacking cough  Lymphadenopathy:    He has no cervical adenopathy.  Skin: Skin is warm and dry.          Assessment & Plan:

## 2013-08-13 NOTE — Assessment & Plan Note (Signed)
Air movement and wheezing improved s/p duoneb tx.  Start Zpack for possible infectious trigger.  Add daily controller ICS.  Refill albuterol.  Reviewed supportive care and red flags that should prompt return.  Pt expressed understanding and is in agreement w/ plan.

## 2013-09-29 ENCOUNTER — Other Ambulatory Visit: Payer: Self-pay | Admitting: Family Medicine

## 2013-09-29 NOTE — Telephone Encounter (Signed)
Med filled.  

## 2013-10-29 ENCOUNTER — Other Ambulatory Visit: Payer: Self-pay | Admitting: Cardiovascular Disease

## 2013-12-09 ENCOUNTER — Other Ambulatory Visit: Payer: Self-pay | Admitting: General Practice

## 2013-12-09 ENCOUNTER — Telehealth: Payer: Self-pay | Admitting: *Deleted

## 2013-12-09 MED ORDER — CLOTRIMAZOLE-BETAMETHASONE 1-0.05 % EX CREA: 1.0000 "application " | TOPICAL_CREAM | Freq: Two times a day (BID) | CUTANEOUS | Status: DC

## 2013-12-09 NOTE — Telephone Encounter (Signed)
Med filled and pt wife notified.

## 2013-12-09 NOTE — Telephone Encounter (Signed)
Patient wife called and stated that her husband needs a refill on Clotrimazole   Pharmacy [facility] [address]

## 2014-02-02 ENCOUNTER — Other Ambulatory Visit: Payer: Self-pay | Admitting: Cardiovascular Disease

## 2014-04-15 ENCOUNTER — Encounter (HOSPITAL_COMMUNITY): Payer: Self-pay | Admitting: Emergency Medicine

## 2014-04-15 ENCOUNTER — Emergency Department (HOSPITAL_COMMUNITY)
Admission: EM | Admit: 2014-04-15 | Discharge: 2014-04-15 | Disposition: A | Discharge status: Home / Self Care | Payer: Self-pay | Source: Home / Self Care

## 2014-04-15 ENCOUNTER — Emergency Department (INDEPENDENT_AMBULATORY_CARE_PROVIDER_SITE_OTHER): Payer: Medicaid Other

## 2014-04-15 DIAGNOSIS — M109 Gout, unspecified: Secondary | ICD-10-CM

## 2014-04-15 DIAGNOSIS — M10071 Idiopathic gout, right ankle and foot: Secondary | ICD-10-CM

## 2014-04-15 MED ORDER — HYDROCODONE-ACETAMINOPHEN 5-325 MG PO TABS
ORAL_TABLET | ORAL | Status: AC
  Filled 2014-04-15: qty 2

## 2014-04-15 MED ORDER — HYDROCODONE-ACETAMINOPHEN 5-325 MG PO TABS
2.0000 | ORAL_TABLET | Freq: Once | ORAL | Status: AC
  Administered 2014-04-15: 2 via ORAL

## 2014-04-15 MED ORDER — HYDROCODONE-ACETAMINOPHEN 10-325 MG PO TABS: 1.0000 | ORAL_TABLET | Freq: Four times a day (QID) | ORAL | Status: DC | PRN

## 2014-04-15 MED ORDER — COLCHICINE 0.6 MG PO TABS: ORAL_TABLET | ORAL | Status: DC

## 2014-04-15 MED ORDER — INDOMETHACIN 50 MG PO CAPS: 50.0000 mg | ORAL_CAPSULE | Freq: Two times a day (BID) | ORAL | Status: DC

## 2014-04-15 NOTE — ED Provider Notes (Signed)
CSN: [id number]     Arrival date & time 04/15/14  1327 History   First MD Initiated Contact with Patient 04/15/14 1427     Chief Complaint  Patient presents with  . Foot Pain   (Consider location/radiation/quality/duration/timing/severity/associated sxs/prior Treatment) HPI Comments: [age] he will male complaining of pain to the right foot and ankle since yesterday morning. Pain started in the ankle. The swelling has increased over the past 24 hours. The central area of pain is located to the anterior mid ankle. He initially was walking on the foot yesterday but the pain is increased and is unable to bear weight. Denies any known injury or trauma. He has a history of gout. Denies fever. St hx of gout.  Patient is a 63 y.o. male presenting with lower extremity pain.  Foot Pain Pertinent negatives include no chest pain.    Past Medical History  Diagnosis Date  . CAD (coronary artery disease)     a. 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 EF 65% at cath 8/08  . HTN (hypertension)   . HLD (hyperlipidemia)   . Asthma   . Testosterone deficiency    History reviewed. No pertinent past surgical history. History reviewed. No pertinent family history. History  Substance Use Topics  . Smoking status: Former Games developermoker  . Smokeless tobacco: Former NeurosurgeonUser    Quit date: 10/13/2001  . Alcohol Use: No    Review of Systems  Constitutional: Negative for activity change and fatigue.  Respiratory: Negative.   Cardiovascular: Negative for chest pain.  Genitourinary: Negative.   Musculoskeletal: Positive for arthralgias and joint swelling. Negative for back pain, neck pain and neck stiffness.  Skin: Negative.     Allergies  Review of patient's allergies indicates no known allergies.  Home Medications   Prior to Admission medications   Medication Sig Start Date End Date Taking? Authorizing Provider  aspirin 81 MG EC tablet Take 81 mg by mouth daily.     Yes Historical  Provider, MD  atorvastatin (LIPITOR) 80 MG tablet TAKE ONE TABLET BY MOUTH EVERY DAY 07/08/13  Yes Tonny BollmanMichael Cooper, MD  albuterol (VENTOLIN HFA) 108 (90 BASE) MCG/ACT inhaler Inhale 2 puffs into the lungs every 6 (six) hours as needed. 08/12/13   Sheliah HatchKatherine E Tabori, MD  beclomethasone (QVAR) 80 MCG/ACT inhaler Inhale 1 puff into the lungs 2 (two) times daily. 08/12/13   Sheliah HatchKatherine E Tabori, MD  clotrimazole-betamethasone (LOTRISONE) cream Apply 1 application topically 2 (two) times daily. 12/09/13   Sheliah HatchKatherine E Tabori, MD  colchicine 0.6 MG tablet 1 tabs po x 1, then one tab po 1 hour later, then 1 q d. Take with food. 04/15/14   Hayden Rasmussenavid Latrise Bowland, NP  hydrochlorothiazide (MICROZIDE) 12.5 MG capsule TAKE 1 CAPSULE BY MOUTH EVERY DAY 09/29/13   Sheliah HatchKatherine E Tabori, MD  HYDROcodone-acetaminophen [facility](NORCO) 10-325 MG per tablet Take 1 tablet by mouth every 6 (six) hours as needed. 04/15/14   Hayden Rasmussenavid Chala Gul, NP  indomethacin (INDOCIN) 50 MG capsule Take 1 capsule (50 mg total) by mouth 2 (two) times daily with a meal. 04/15/14   Hayden Rasmussenavid Garland Smouse, NP  Melatonin 5 MG CAPS Take by mouth daily.    Historical Provider, MD  metoprolol tartrate (LOPRESSOR) 25 MG tablet TAKE 1 TABLET BY MOUTH EVERY 12 HOURS 10/29/13   Tonny BollmanMichael Cooper, MD   BP 149/81  Pulse 80  Temp(Src) 98.6 F (37 C) (Oral)  Resp 18  SpO2 100% Physical Exam  Nursing note and vitals reviewed. Constitutional: He is oriented to person, place, and time. He appears well-developed and well-nourished. No distress.  Neck: Normal range of motion. Neck supple.  Pulmonary/Chest: Effort normal. No respiratory distress.  Musculoskeletal:  Right ankle and foot edematous. No deformity. Dorsiflexion very painful and limited, as is plantar flexion. Greatest amt of pain and tenderness mid anterior ankle. No foot tenderness. Cap refill <2 sec. Nl warmth, Pedal pulse 1+  Neurological: He is alert and oriented to person, place, and time. He exhibits abnormal muscle tone.  Skin: Skin is  warm and dry.    ED Course  Procedures (including critical care time) Labs Review Labs Reviewed - No data to display  Imaging Review Dg Ankle Complete Right  04/15/2014   CLINICAL DATA:  Right anterior ankle pain  EXAM: RIGHT ANKLE - COMPLETE 3+ VIEW  COMPARISON:  None.  FINDINGS: There is no evidence of fracture, dislocation, or joint effusion. There is no evidence of arthropathy or other focal bone abnormality. Soft tissues are unremarkable.  IMPRESSION: Negative.   Electronically Signed   By: Elige KoHetal  Patel   On: 04/15/2014 15:07     MDM   1. Acute idiopathic gout of right ankle     Indocin 50 mg colcrys norco 10 mg #16 F/U with PCP Return or go to the ED if worse or new sx's, red flags as we discussed. ACE wrap applied.  Hayden Rasmussenavid Sydny Schnitzler, NP 04/15/14 1538  Hayden Rasmussenavid Moroni Nester, NP 04/15/14 1539  Hayden Rasmussenavid Matelyn Antonelli, NP 04/15/14 [phone]

## 2014-04-15 NOTE — ED Notes (Signed)
Day 2 of pain and swelling right foot, getting worse; no known injury; good posterior tibial pulse palpated

## 2014-04-15 NOTE — Discharge Instructions (Signed)
Gout Stop your Lipitor for 5 days while taking the gout medicine. Gout is an inflammatory arthritis caused by a buildup of uric acid crystals in the joints. Uric acid is a chemical that is normally present in the blood. When the level of uric acid in the blood is too high it can form crystals that deposit in your joints and tissues. This causes joint redness, soreness, and swelling (inflammation). Repeat attacks are common. Over time, uric acid crystals can form into masses (tophi) near a joint, destroying bone and causing disfigurement. Gout is treatable and often preventable. CAUSES  The disease begins with elevated levels of uric acid in the blood. Uric acid is produced by your body when it breaks down a naturally found substance called purines. Certain foods you eat, such as meats and fish, contain high amounts of purines. Causes of an elevated uric acid level include:  Being passed down from parent to child (heredity).  Diseases that cause increased uric acid production (such as obesity, psoriasis, and certain cancers).  Excessive alcohol use.  Diet, especially diets rich in meat and seafood.  Medicines, including certain cancer-fighting medicines (chemotherapy), water pills (diuretics), and aspirin.  Chronic kidney disease. The kidneys are no longer able to remove uric acid well.  Problems with metabolism. Conditions strongly associated with gout include:  Obesity.  High blood pressure.  High cholesterol.  Diabetes. Not everyone with elevated uric acid levels gets gout. It is not understood why some people get gout and others do not. Surgery, joint injury, and eating too much of certain foods are some of the factors that can lead to gout attacks. SYMPTOMS   An attack of gout comes on quickly. It causes intense pain with redness, swelling, and warmth in a joint.  Fever can occur.  Often, only one joint is involved. Certain joints are more commonly involved:  Base of the big  toe.  Knee.  Ankle.  Wrist.  Finger. Without treatment, an attack usually goes away in a few days to weeks. Between attacks, you usually will not have symptoms, which is different from many other forms of arthritis. DIAGNOSIS  Your caregiver will suspect gout based on your symptoms and exam. In some cases, tests may be recommended. The tests may include:  Blood tests.  Urine tests.  X-rays.  Joint fluid exam. This exam requires a needle to remove fluid from the joint (arthrocentesis). Using a microscope, gout is confirmed when uric acid crystals are seen in the joint fluid. TREATMENT  There are two phases to gout treatment: treating the sudden onset (acute) attack and preventing attacks (prophylaxis).  Treatment of an Acute Attack.  Medicines are used. These include anti-inflammatory medicines or steroid medicines.  An injection of steroid medicine into the affected joint is sometimes necessary.  The painful joint is rested. Movement can worsen the arthritis.  You may use warm or cold treatments on painful joints, depending which works best for you.  Treatment to Prevent Attacks.  If you suffer from frequent gout attacks, your caregiver may advise preventive medicine. These medicines are started after the acute attack subsides. These medicines either help your kidneys eliminate uric acid from your body or decrease your uric acid production. You may need to stay on these medicines for a very long time.  The early phase of treatment with preventive medicine can be associated with an increase in acute gout attacks. For this reason, during the first few months of treatment, your caregiver may also advise you to  take medicines usually used for acute gout treatment. Be sure you understand your caregiver's directions. Your caregiver may make several adjustments to your medicine dose before these medicines are effective.  Discuss dietary treatment with your caregiver or dietitian.  Alcohol and drinks high in sugar and fructose and foods such as meat, poultry, and seafood can increase uric acid levels. Your caregiver or dietician can advise you on drinks and foods that should be limited. HOME CARE INSTRUCTIONS   Do not take aspirin to relieve pain. This raises uric acid levels.  Only take over-the-counter or prescription medicines for pain, discomfort, or fever as directed by your caregiver.  Rest the joint as much as possible. When in bed, keep sheets and blankets off painful areas.  Keep the affected joint raised (elevated).  Apply warm or cold treatments to painful joints. Use of warm or cold treatments depends on which works best for you.  Use crutches if the painful joint is in your leg.  Drink enough fluids to keep your urine clear or pale yellow. This helps your body get rid of uric acid. Limit alcohol, sugary drinks, and fructose drinks.  Follow your dietary instructions. Pay careful attention to the amount of protein you eat. Your daily diet should emphasize fruits, vegetables, whole grains, and fat-free or low-fat milk products. Discuss the use of coffee, vitamin C, and cherries with your caregiver or dietician. These may be helpful in lowering uric acid levels.  Maintain a healthy body weight. SEEK MEDICAL CARE IF:   You develop diarrhea, vomiting, or any side effects from medicines.  You do not feel better in 24 hours, or you are getting worse. SEEK IMMEDIATE MEDICAL CARE IF:   Your joint becomes suddenly more tender, and you have chills or a fever. MAKE SURE YOU:   Understand these instructions.  Will watch your condition.  Will get help right away if you are not doing well or get worse. Document Released: 09/26/2000 Document Revised: 01/24/2013 Document Reviewed: 05/12/2012 [facility] Patient Information 2015 [place]. This information is not intended to replace advice given to you by your health care provider. Make sure you discuss any  questions you have with your health care provider.  Low-Purine Diet Purines are compounds that affect the level of uric acid in your body. A low-purine diet is a diet that is low in purines. Eating a low-purine diet can prevent the level of uric acid in your body from getting too high and causing gout or kidney stones or both. WHAT DO I NEED TO KNOW ABOUT THIS DIET?  Choose low-purine foods. Examples of low-purine foods are listed in the next section.  Drink plenty of fluids, especially water. Fluids can help remove uric acid from your body. Try to drink 8-16 cups (1.9-3.8 L) a day.  Limit foods high in fat, especially saturated fat, as fat makes it harder for the body to get rid of uric acid. Foods high in saturated fat include pizza, cheese, ice cream, whole milk, fried foods, and gravies. Choose foods that are lower in fat and lean sources of protein. Use olive oil when cooking as it contains healthy fats that are not high in saturated fat.  Limit alcohol. Alcohol interferes with the elimination of uric acid from your body. If you are having a gout attack, avoid all alcohol.  Keep in mind that different people's bodies react differently to different foods. You will probably learn over time which foods do or do not affect you. If you  discover that a food tends to cause your gout to flare up, avoid eating that food. You can more freely enjoy foods that do not cause problems. If you have any questions about a food item, talk to your dietitian or health care provider. WHICH FOODS ARE LOW, MODERATE, AND HIGH IN PURINES? The following is a list of foods that are low, moderate, and high in purines. You can eat any amount of the foods that are low in purines. You may be able to have small amounts of foods that are moderate in purines. Ask your health care provider how much of a food moderate in purines you can have. Avoid foods high in purines. Grains  Foods low in purines: Enriched white bread, pasta,  rice, cake, cornbread, popcorn.  Foods moderate in purines: Whole-grain breads and cereals, wheat germ, bran, oatmeal. Uncooked oatmeal. Dry wheat bran or wheat germ.  Foods high in purines: Pancakes, [place] toast, biscuits, muffins. Vegetables  Foods low in purines: All vegetables, except those that are moderate in purines.  Foods moderate in purines: Asparagus, cauliflower, spinach, mushrooms, green peas. Fruits  All fruits are low in purines. Meats and other Protein Foods  Foods low in purines: Eggs, nuts, peanut butter.  Foods moderate in purines: 80-90% lean beef, lamb, veal, pork, poultry, fish, eggs, peanut butter, nuts. Crab, lobster, oysters, and shrimp. Cooked dried beans, peas, and lentils.  Foods high in purines: Anchovies, sardines, herring, mussels, tuna, codfish, scallops, trout, and haddock. Kevin Guzman. Organ meats (such as liver or kidney). Tripe. Game meat. Goose. Sweetbreads. Dairy  All dairy foods are low in purines. Low-fat and fat-free dairy products are best because they are low in saturated fat. Beverages  Drinks low in purines: Water, carbonated beverages, tea, coffee, cocoa.  Drinks moderate in purines: Soft drinks and other drinks sweetened with high-fructose corn syrup. Juices. To find whether a food or drink is sweetened with high-fructose corn syrup, look at the ingredients list.  Drinks high in purines: Alcoholic beverages (such as beer). Condiments  Foods low in purines: Salt, herbs, olives, pickles, relishes, vinegar.  Foods moderate in purines: Butter, margarine, oils, mayonnaise. Fats and Oils  Foods low in purines: All types, except gravies and sauces made with meat.  Foods high in purines: Gravies and sauces made with meat. Other Foods  Foods low in purines: Sugars, sweets, gelatin. Cake. Soups made without meat.  Foods moderate in purines: Meat-based or fish-based soups, broths, or bouillons. Foods and drinks sweetened with high-fructose  corn syrup.  Foods high in purines: High-fat desserts (such as ice cream, cookies, cakes, pies, doughnuts, and chocolate). Contact your dietitian for more information on foods that are not listed here. Document Released: 01/24/2011 Document Revised: 10/04/2013 Document Reviewed: 09/05/2013 [facility] Patient Information 2015 [place], [place]. This information is not intended to replace advice given to you by your health care provider. Make sure you discuss any questions you have with your health care provider.

## 2014-04-16 NOTE — ED Provider Notes (Signed)
Medical screening examination/treatment/procedure(s) were performed by non-physician practitioner and as supervising physician I was immediately available for consultation/collaboration.  Calyn Sivils, M.D.  Naheim Burgen C Nell Gales, MD 04/16/14 0844 

## 2014-06-21 ENCOUNTER — Telehealth: Payer: Self-pay | Admitting: Family Medicine

## 2014-06-21 MED ORDER — COLCHICINE 0.6 MG PO TABS: ORAL_TABLET | ORAL | Status: DC

## 2014-06-21 NOTE — Telephone Encounter (Addendum)
Caller name: Millard  Relation to pt: self  Call back number:  Pharmacy: *New Pharmacy* [facility] [phone]  Reason for call:    pt expercing Gout  requesting colchicine 0.6 MG tablet.   Advised pt he is due for he's annual he stated he is not currently working and he has no insurance just wanted to inform you.last seen 08/12/13 for a follow up.

## 2014-06-21 NOTE — Telephone Encounter (Signed)
Ok for colchicine 0.6- 2 tabs at 1st sign of gout and then 1 tab 1 hr later.  Do not do more than once every 2 weeks.  #9 (will cover 3 attacks)

## 2014-06-21 NOTE — Telephone Encounter (Signed)
Med filled and pt notified.  

## 2014-08-01 ENCOUNTER — Telehealth: Payer: Self-pay | Admitting: General Practice

## 2014-08-01 MED ORDER — HYDROCHLOROTHIAZIDE 12.5 MG PO CAPS: ORAL_CAPSULE | ORAL | Status: DC

## 2014-08-01 NOTE — Telephone Encounter (Signed)
Ok for #30, needs OV for BP and labs

## 2014-08-01 NOTE — Telephone Encounter (Signed)
Med filled and letter mailed.  

## 2014-08-01 NOTE — Telephone Encounter (Signed)
Last OV 08-12-13 Med filled 09-29-13 #30 with 5

## 2014-08-02 ENCOUNTER — Telehealth: Payer: Self-pay | Admitting: Family Medicine

## 2014-08-02 NOTE — Telephone Encounter (Signed)
FYI

## 2014-08-02 NOTE — Telephone Encounter (Signed)
Caller name: Janyth Pupaicholas  Reason for call: Pt is returning call from 10.20.15. Pt was informed about his med and that needed OV for his BP checked. Pt states is not able to make an appt for the moment since has no health insurance and no money to pay for visit.  Thank you.

## 2014-08-03 NOTE — Telephone Encounter (Signed)
error 

## 2014-09-04 ENCOUNTER — Encounter: Payer: Self-pay | Admitting: Nurse Practitioner

## 2014-09-04 ENCOUNTER — Ambulatory Visit (INDEPENDENT_AMBULATORY_CARE_PROVIDER_SITE_OTHER): Payer: Self-pay | Admitting: Nurse Practitioner

## 2014-09-04 VITALS — BP 143/83 | HR 75 | Temp 98.4°F | Resp 18 | Ht 64.0 in | Wt 193.0 lb

## 2014-09-04 DIAGNOSIS — J4521 Mild intermittent asthma with (acute) exacerbation: Secondary | ICD-10-CM

## 2014-09-04 MED ORDER — METHYLPREDNISOLONE ACETATE 40 MG/ML IJ SUSP
40.0000 mg | Freq: Once | INTRAMUSCULAR | Status: AC
  Administered 2014-09-04: 40 mg via INTRAMUSCULAR

## 2014-09-04 MED ORDER — PREDNISONE 5 MG PO TABS: ORAL_TABLET | ORAL | Status: DC

## 2014-09-04 MED ORDER — BECLOMETHASONE DIPROPIONATE 80 MCG/ACT IN AERS: 1.0000 | INHALATION_SPRAY | Freq: Two times a day (BID) | RESPIRATORY_TRACT | Status: DC

## 2014-09-04 MED ORDER — IPRATROPIUM-ALBUTEROL 0.5-2.5 (3) MG/3ML IN SOLN
3.0000 mL | Freq: Once | RESPIRATORY_TRACT | Status: AC
  Administered 2014-09-04: 3 mL via RESPIRATORY_TRACT

## 2014-09-04 MED ORDER — IPRATROPIUM-ALBUTEROL 0.5-2.5 (3) MG/3ML IN SOLN: 3.0000 mL | Freq: Once | RESPIRATORY_TRACT | Status: DC

## 2014-09-04 MED ORDER — ALBUTEROL SULFATE HFA 108 (90 BASE) MCG/ACT IN AERS: 2.0000 | INHALATION_SPRAY | Freq: Four times a day (QID) | RESPIRATORY_TRACT | Status: DC | PRN

## 2014-09-04 NOTE — Patient Instructions (Addendum)
Continue albuterol inhaler as needed. If you need it more than twice daily, you need to come see us or go to ER.  Please start Qvar. Use twice daily for best asthma management. This medication can prevent an acute exacerbation like the one you are having today.  Tomorrow, start prednisone tablets. Take in morning as directed.   If symptoms return, please call us.  [facility], pneumonia, & tetanus vaccines [phone]. You are vulnerable to serious complications from flu & pneumonia because you have asthma. Vaccines are likely to prevent complications.

## 2014-09-04 NOTE — Progress Notes (Signed)
Subjective:     Kevin Guzman is a 63 y.o. male who presents for evaluation of asthma. The patient is currently having symptoms. Current symptoms include chest tightness, dyspnea, wheezing and SOB. Symptoms have been present since 4 days ago and have been gradually worsening. He denies chest pain and fever. This episode appears to have been triggered by no identifiable factor. Treatments tried for the current exacerbation include short-acting inhaled beta-adrenergic agonists, which have provided some relief of symptoms. He has been using alb inhaler "every few hours" the last few days. The patient had a similar episodes 1 year ago. He is not using Qvar as prescribed.  Pt expresses stress with being OOW, caring for grandkids, father-in-law moving in, wife not working.  The following portions of the patient's history were reviewed and updated as appropriate: allergies, current medications, past family history, past medical history, past social history, past surgical history and problem list.  Review of Systems Constitutional: negative for fevers and night sweats Ears, nose, mouth, throat, and face: positive for nasal congestion, negative for sore throat Respiratory: positive for asthma, cough, dyspnea on exertion, sputum and wheezing, negative for pleurisy/chest pain Cardiovascular: negative for irregular heart beat and lower extremity edema    Objective:    Oxygen saturation 91% on room air before nebulizers. After 2 nebulizers SPO2 is 95%. BP 143/83 mmHg  Pulse 75  Temp(Src) 98.4 F (36.9 C) (Oral)  Resp 18  Ht 5\' 4"  (1.626 m)  Wt 193 lb (87.544 kg)  BMI 33.11 kg/m2  SpO2 91% General appearance: alert, cooperative, appears stated age, moderate distress and speaking in 3 to 4 word sentences Head: Normocephalic, without obvious abnormality, atraumatic Eyes: negative findings: lids and lashes normal and conjunctivae and sclerae normal Nose: mild congestion Lungs: diminished breath  sounds throughout, wheezes LLL and RLL and improved air movement & productive cough, persistent wheezes after neb treatments,  Heart: regular rate and rhythm, S1, S2 normal, no murmur, click, rub or gallop    Assessment:    Moderate persistent asthma. Apparent precipitants include no identifiable factor. Treatment with .5 mg atrovent & 2.5 mg albuterol neb X 2 doses, 40 mg depomedrol IM was given in the office     Plan:      - albuterol (VENTOLIN HFA) 108 (90 BASE) MCG/ACT inhaler; Inhale 2 puffs into the lungs every 6 (six) hours as needed.  Dispense: 1 Inhaler; Refill: 3 - beclomethasone (QVAR) 80 MCG/ACT inhaler; Inhale 1 puff into the lungs 2 (two) times daily.  Dispense: 1 Inhaler; Refill: 12 - predniSONE (DELTASONE) 5 MG tablet; Take 4T po qam X 3d, then 3T PO qam X 4d, then 2T PO QAM X 4d, then 1T PO QAM X 4d.  Dispense: 36 tablet; Refill: 0 Go to health Dept to get flu, pneumonia, & TDap vaccines Daily sinus washes. Provided education: if needs albuterol more than twice in 1 day, call us or go to ER. F/u PRN return symptoms

## 2014-10-12 ENCOUNTER — Other Ambulatory Visit (HOSPITAL_COMMUNITY): Payer: Self-pay | Admitting: Cardiovascular Disease

## 2014-10-19 ENCOUNTER — Telehealth: Payer: Self-pay | Admitting: General Practice

## 2014-10-19 NOTE — Telephone Encounter (Signed)
Last OV 08-12-13 (asthma) HCTZ filled 08-01-14 #30 with 0, letter mailed same day to pt to make appt.

## 2014-10-19 NOTE — Telephone Encounter (Signed)
Med denied.  

## 2014-10-19 NOTE — Telephone Encounter (Signed)
Pt needs appt before any refills will be given.

## 2014-11-11 ENCOUNTER — Other Ambulatory Visit (HOSPITAL_COMMUNITY): Payer: Self-pay | Admitting: Cardiovascular Disease

## 2014-11-17 ENCOUNTER — Other Ambulatory Visit: Payer: Self-pay | Admitting: Family Medicine

## 2014-11-17 NOTE — Telephone Encounter (Signed)
Caller name:Mckell, Janyth PupaNicholas Relation to ZO:XWRUpt:self Call back [phone] Pharmacy:[facility]  Reason for call: pt is needing rx hydrochlorothiazide (MICROZIDE) 12.5 MG capsule pt would like to know if he can get a 2 month supply if possible

## 2014-11-17 NOTE — Addendum Note (Signed)
Addended by: Jackson LatinoYLER, Abron Neddo L on: 11/17/2014 01:07 PM   Modules accepted: Orders

## 2014-11-17 NOTE — Telephone Encounter (Signed)
Last OV 08-12-13 HCTZ last filled 08/01/14 #30 with 0  Letter mailed 08/01/14 to schedule an appt, no upcoming appts

## 2014-12-13 ENCOUNTER — Telehealth: Payer: Self-pay | Admitting: Family Medicine

## 2014-12-13 MED ORDER — HYDROCHLOROTHIAZIDE 12.5 MG PO CAPS: ORAL_CAPSULE | ORAL | Status: DC

## 2014-12-13 NOTE — Telephone Encounter (Signed)
Caller name: Salmaan Relation to pt: self Call back number: [phone] [facility]  Reason for call:   Requesting a hydrochlorothiazide refill   [facility]

## 2014-12-13 NOTE — Telephone Encounter (Signed)
I filled this medication today. Pt has not seen Tabori since 2014. Please have him make a Bp follow up appt. I cannot fill his medications anymore without it.

## 2014-12-13 NOTE — Telephone Encounter (Signed)
Informed patient of this and he will call back to schedule

## 2014-12-30 ENCOUNTER — Emergency Department (HOSPITAL_BASED_OUTPATIENT_CLINIC_OR_DEPARTMENT_OTHER)
Admission: EM | Admit: 2014-12-30 | Discharge: 2014-12-30 | Disposition: A | Discharge status: Home / Self Care | Payer: Medicaid Other | Attending: Emergency Medicine | Admitting: Emergency Medicine

## 2014-12-30 ENCOUNTER — Encounter (HOSPITAL_BASED_OUTPATIENT_CLINIC_OR_DEPARTMENT_OTHER): Payer: Self-pay

## 2014-12-30 ENCOUNTER — Emergency Department (HOSPITAL_BASED_OUTPATIENT_CLINIC_OR_DEPARTMENT_OTHER): Payer: Medicaid Other

## 2014-12-30 DIAGNOSIS — Z87891 Personal history of nicotine dependence: Secondary | ICD-10-CM | POA: Insufficient documentation

## 2014-12-30 DIAGNOSIS — E785 Hyperlipidemia, unspecified: Secondary | ICD-10-CM | POA: Diagnosis not present

## 2014-12-30 DIAGNOSIS — I10 Essential (primary) hypertension: Secondary | ICD-10-CM | POA: Diagnosis not present

## 2014-12-30 DIAGNOSIS — Z79899 Other long term (current) drug therapy: Secondary | ICD-10-CM | POA: Insufficient documentation

## 2014-12-30 DIAGNOSIS — J45901 Unspecified asthma with (acute) exacerbation: Secondary | ICD-10-CM | POA: Diagnosis not present

## 2014-12-30 DIAGNOSIS — R0602 Shortness of breath: Secondary | ICD-10-CM | POA: Diagnosis present

## 2014-12-30 DIAGNOSIS — I2510 Atherosclerotic heart disease of native coronary artery without angina pectoris: Secondary | ICD-10-CM | POA: Diagnosis not present

## 2014-12-30 DIAGNOSIS — Z7982 Long term (current) use of aspirin: Secondary | ICD-10-CM | POA: Diagnosis not present

## 2014-12-30 MED ORDER — ALBUTEROL SULFATE HFA 108 (90 BASE) MCG/ACT IN AERS: 2.0000 | INHALATION_SPRAY | RESPIRATORY_TRACT | Status: DC | PRN

## 2014-12-30 MED ORDER — ALBUTEROL SULFATE HFA 108 (90 BASE) MCG/ACT IN AERS
2.0000 | INHALATION_SPRAY | RESPIRATORY_TRACT | Status: DC | PRN
  Filled 2014-12-30: qty 6.7

## 2014-12-30 MED ORDER — PREDNISONE 50 MG PO TABS
60.0000 mg | ORAL_TABLET | Freq: Once | ORAL | Status: AC
  Administered 2014-12-30: 60 mg via ORAL
  Filled 2014-12-30 (×2): qty 1

## 2014-12-30 MED ORDER — ALBUTEROL SULFATE (2.5 MG/3ML) 0.083% IN NEBU
5.0000 mg | INHALATION_SOLUTION | Freq: Once | RESPIRATORY_TRACT | Status: AC
  Administered 2014-12-30: 5 mg via RESPIRATORY_TRACT
  Filled 2014-12-30: qty 6

## 2014-12-30 MED ORDER — PREDNISONE 20 MG PO TABS: 60.0000 mg | ORAL_TABLET | Freq: Every day | ORAL | Status: DC

## 2014-12-30 MED ORDER — LORATADINE 10 MG PO TABS: 10.0000 mg | ORAL_TABLET | Freq: Every day | ORAL | Status: DC

## 2014-12-30 MED ORDER — IPRATROPIUM-ALBUTEROL 0.5-2.5 (3) MG/3ML IN SOLN
3.0000 mL | RESPIRATORY_TRACT | Status: DC
  Administered 2014-12-30: 3 mL via RESPIRATORY_TRACT
  Filled 2014-12-30: qty 3

## 2014-12-30 NOTE — ED Provider Notes (Signed)
CSN: [id number]     Arrival date & time 12/30/14  0831 History   First MD Initiated Contact with Patient 12/30/14 [phone]     Chief Complaint  Patient presents with  . Shortness of Breath     (Consider location/radiation/quality/duration/timing/severity/associated sxs/prior Treatment) HPI  This is a 64 year old male with a history of coronary artery disease, hypertension, hyperlipidemia, and asthma who presents with shortness of breath. Patient reports 3 week history of shortness of breath and cough. It is worse at night. He does report productive cough. Denies any fevers. He has been taking his albuterol which seems to help but does not last long. He over the last 2 days has taken his albuterol every 2 hours. He states that he sleeps sitting up. He has not noted any leg swelling. No known history of heart failure. Denies any chest pain but does state that he feels tight when his breathing gets worse. He states that his asthma normally gets bad when the weather changes.  Past Medical History  Diagnosis Date  . CAD (coronary artery disease)     a. 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 EF 65% at cath 8/08  . HTN (hypertension)   . HLD (hyperlipidemia)   . Asthma   . Testosterone deficiency    History reviewed. No pertinent past surgical history. No family history on file. History  Substance Use Topics  . Smoking status: Former Games developer  . Smokeless tobacco: Former Neurosurgeon    Quit date: 10/13/2001  . Alcohol Use: No    Review of Systems  Constitutional: Negative.  Negative for fever.  Respiratory: Positive for cough, chest tightness, shortness of breath and wheezing.   Cardiovascular: Negative.  Negative for chest pain and leg swelling.  Gastrointestinal: Negative.  Negative for abdominal pain.  Genitourinary: Negative.  Negative for dysuria.  Musculoskeletal: Negative for back pain.  Neurological: Negative for headaches.  All other systems reviewed and are  negative.     Allergies  Review of patient's allergies indicates no known allergies.  Home Medications   Prior to Admission medications   Medication Sig Start Date End Date Taking? Authorizing Provider  albuterol (PROVENTIL HFA;VENTOLIN HFA) 108 (90 BASE) MCG/ACT inhaler Inhale 2 puffs into the lungs every 4 (four) hours as needed for wheezing or shortness of breath. 12/30/14   Shon Baton, MD  albuterol (VENTOLIN HFA) 108 (90 BASE) MCG/ACT inhaler Inhale 2 puffs into the lungs every 6 (six) hours as needed. 09/04/14   Kelle Darting, NP  aspirin 81 MG EC tablet Take 81 mg by mouth daily.      Historical Provider, MD  atorvastatin (LIPITOR) 80 MG tablet TAKE 1 TABLET BY MOUTH DAILY 10/12/14   Tonny Bollman, MD  beclomethasone (QVAR) 80 MCG/ACT inhaler Inhale 1 puff into the lungs 2 (two) times daily. 09/04/14   Kelle Darting, NP  clotrimazole-betamethasone (LOTRISONE) cream Apply 1 application topically 2 (two) times daily. 12/09/13   Sheliah Hatch, MD  colchicine 0.6 MG tablet 2 tablets at the first sign of gout , then one tab po 1 hour later.  Take with food. 06/21/14   Sheliah Hatch, MD  hydrochlorothiazide (MICROZIDE) 12.5 MG capsule TAKE 1 CAPSULE BY MOUTH EVERY DAY 12/13/14   Sheliah Hatch, MD  HYDROcodone-acetaminophen [facility]) 10-325 MG per tablet Take 1 tablet by mouth every 6 (six) hours as needed. 04/15/14   Hayden Rasmussen, NP  indomethacin (INDOCIN) 50 MG  capsule Take 1 capsule (50 mg total) by mouth 2 (two) times daily with a meal. 04/15/14   Hayden Rasmussenavid Mabe, NP  loratadine (CLARITIN) 10 MG tablet Take 1 tablet (10 mg total) by mouth daily. 12/30/14   Shon Batonourtney F Horton, MD  Melatonin 5 MG CAPS Take by mouth daily.    Historical Provider, MD  metoprolol tartrate (LOPRESSOR) 25 MG tablet TAKE ONE TABLET   BY MOUTH   EVERY 12 HOURS 11/13/14   Tonny BollmanMichael Cooper, MD  predniSONE (DELTASONE) 20 MG tablet Take 3 tablets (60 mg total) by mouth daily with breakfast. 12/30/14   Shon Batonourtney F  Horton, MD   BP 141/74 mmHg  Pulse 88  Temp(Src) 97.9 F (36.6 C) (Oral)  Resp 20  SpO2 88% Physical Exam  Constitutional: He is oriented to person, place, and time. He appears well-developed and well-nourished. No distress.  HENT:  Head: Normocephalic and atraumatic.  Eyes: Pupils are equal, round, and reactive to light.  Neck: Neck supple.  Cardiovascular: Normal rate, regular rhythm and normal heart sounds.   No murmur heard. Pulmonary/Chest: Effort normal. No respiratory distress. He has wheezes.  Fair movement Midline sternotomy scar  Abdominal: Soft. Bowel sounds are normal. There is no tenderness. There is no rebound.  Musculoskeletal: He exhibits no edema.  Lymphadenopathy:    He has no cervical adenopathy.  Neurological: He is alert and oriented to person, place, and time.  Skin: Skin is warm and dry.  Psychiatric: He has a normal mood and affect.  Nursing note and vitals reviewed.   ED Course  Procedures (including critical care time) Labs Review Labs Reviewed - No data to display  Imaging Review Dg Chest 2 View  12/30/2014   CLINICAL DATA:  Prod cough, sob, congestion x3wks. Pt recd breathing tx in er. Hx cabg, asthma, former smoker.  EXAM: CHEST  2 VIEW  COMPARISON:  07/21/2013  FINDINGS: Changes from CABG surgery are stable. Cardiac silhouette is normal in size and configuration. No mediastinal or hilar masses or evidence of adenopathy. Clear lungs. No pleural effusion or pneumothorax.  Bony thorax is intact.  IMPRESSION: No acute cardiopulmonary disease.   Electronically Signed   By: Amie Portlandavid  Ormond M.D.   On: 12/30/2014 09:48     EKG Interpretation   Date/Time:  Saturday December 30 2014 09:12:42 EDT Ventricular Rate:  80 PR Interval:  138 QRS Duration: 118 QT Interval:  398 QTC Calculation: 459 R Axis:   39 Text Interpretation:  Normal sinus rhythm Incomplete right bundle branch  block Nonspecific ST and T wave abnormality Abnormal ECG No significant   change since last tracing Confirmed by HORTON  MD, COURTNEY ([id number]) on  12/30/2014 9:14:29 AM      MDM   Final diagnoses:  Asthma exacerbation    Patient presents with worsening SOB.  History of asthma.  Wheezing on exam.  No acute respiratory distress. Given duoneb and prednisone.  CHest Xray neg.  ON repeat exam, patient reports improvement with better air movement.  Duoneb repeated.  Patient ambulated and maintained pulse ox of >89%.  WIll d/c with claritin, prednisone and continued inhaler use at home.  After history, exam, and medical workup I feel the patient has been appropriately medically screened and is safe for discharge home. Pertinent diagnoses were discussed with the patient. Patient was given return precautions.     Shon Batonourtney F Horton, MD 12/30/14 2044

## 2014-12-30 NOTE — ED Notes (Signed)
Patient here with 3 weeks of shortness of breath and cough that is worse at night, reports that the cough is productive and using his inhaler for asthma with minimal relief

## 2014-12-30 NOTE — Discharge Instructions (Signed)

## 2014-12-30 NOTE — ED Notes (Signed)
Patient here with shortness of breath x 3 weeks. Reports that he has asthma and thinks it is related to the weather. Has been using inhaler and family members neb machine with some relief.  Describes cough as productive. No fever, no distress

## 2015-01-10 ENCOUNTER — Ambulatory Visit (INDEPENDENT_AMBULATORY_CARE_PROVIDER_SITE_OTHER): Payer: Medicaid Other | Admitting: Cardiovascular Disease

## 2015-01-10 ENCOUNTER — Encounter: Payer: Self-pay | Admitting: Cardiovascular Disease

## 2015-01-10 VITALS — BP 110/80 | HR 86 | Ht 64.0 in | Wt 193.0 lb

## 2015-01-10 DIAGNOSIS — I2510 Atherosclerotic heart disease of native coronary artery without angina pectoris: Secondary | ICD-10-CM | POA: Diagnosis not present

## 2015-01-10 MED ORDER — METOPROLOL TARTRATE 25 MG PO TABS: 25.0000 mg | ORAL_TABLET | Freq: Two times a day (BID) | ORAL | Status: DC

## 2015-01-10 MED ORDER — HYDROCHLOROTHIAZIDE 12.5 MG PO CAPS: 12.5000 mg | ORAL_CAPSULE | Freq: Every day | ORAL | Status: DC

## 2015-01-10 MED ORDER — ATORVASTATIN CALCIUM 80 MG PO TABS: 80.0000 mg | ORAL_TABLET | Freq: Every day | ORAL | Status: DC

## 2015-01-10 NOTE — Patient Instructions (Addendum)
Your physician has recommended you make the following change in your medication:   START loratadine (Claritin) over the counter for allergies  Your physician recommends that you return for a FASTING lipid profile: and hepatic panel. No eating or drinking after midnight. Lab opens at 7:30.  Your physician wants you to follow-up in: 1 year with Dr. Excell Seltzerooper. You will receive a reminder letter in the mail two months in advance. If you don't receive a letter, please call our office to schedule the follow-up appointment.

## 2015-01-10 NOTE — Progress Notes (Signed)
Cardiology Office Note   Date:  01/10/2015   ID:  Brick Ketcher, DOB [birth year], MRN [id number]  PCP:  Neena Rhymes, MD  Cardiologist:  Tonny Bollman, MD    Chief Complaint  Patient presents with  . hypertension     History of Present Illness: Kevin Guzman is a 64 y.o. male who presents for follow-up evaluation. The patient has coronary artery disease and initially presented in 2008 with a non-ST elevation infarction. He had severe 2 vessel coronary artery disease and underwent coronary bypass surgery. He is also followed for hypertension and hyperlipidemia.  He's had problems with asthma recently. Has been to the ER with shortness of breath and wheezing. He's been treated with steroids and inhalers and is doing better. Had similar problems last year at the same time of year - attributes to seasonal allergies.    Past Medical History  Diagnosis Date  . CAD (coronary artery disease)     a. 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 EF 65% at cath 8/08  . HTN (hypertension)   . HLD (hyperlipidemia)   . Asthma   . Testosterone deficiency     History reviewed. No pertinent past surgical history.  Current Outpatient Prescriptions  Medication Sig Dispense Refill  . albuterol (PROVENTIL HFA;VENTOLIN HFA) 108 (90 BASE) MCG/ACT inhaler Inhale 2 puffs into the lungs every 4 (four) hours as needed for wheezing or shortness of breath. 1 Inhaler 0  . albuterol (VENTOLIN HFA) 108 (90 BASE) MCG/ACT inhaler Inhale 2 puffs into the lungs every 6 (six) hours as needed. 1 Inhaler 3  . aspirin 81 MG EC tablet Take 81 mg by mouth daily.      Marland Kitchen atorvastatin (LIPITOR) 80 MG tablet Take 1 tablet (80 mg total) by mouth daily at 6 PM. 90 tablet 3  . beclomethasone (QVAR) 80 MCG/ACT inhaler Inhale 1 puff into the lungs 2 (two) times daily. 1 Inhaler 12  . clotrimazole-betamethasone (LOTRISONE) cream Apply 1 application topically 2 (two) times daily. 45 g 1  .  colchicine 0.6 MG tablet 2 tablets at the first sign of gout , then one tab po 1 hour later.  Take with food. 9 tablet 0  . hydrochlorothiazide (MICROZIDE) 12.5 MG capsule Take 1 capsule (12.5 mg total) by mouth daily. 90 capsule 3  . loratadine (CLARITIN) 10 MG tablet Take 1 tablet (10 mg total) by mouth daily. 30 tablet 0  . Melatonin 5 MG CAPS Take by mouth daily.    . metoprolol tartrate (LOPRESSOR) 25 MG tablet Take 1 tablet (25 mg total) by mouth 2 (two) times daily. 60 tablet 6   No current facility-administered medications for this visit.    Allergies:   Review of patient's allergies indicates no known allergies.   Social History:  The patient  reports that he has quit smoking. He quit smokeless tobacco use about 13 years ago. He reports that he does not drink alcohol.   Family History:  The patient's family history includes Cancer in his mother. There is no history of Heart attack or Stroke.   ROS:  Please see the history of present illness.  Otherwise, review of systems is positive for cough, shortness of breath, wheezing, anxiety.  All other systems are reviewed and negative.   PHYSICAL EXAM: VS:  BP 110/80 mmHg  Pulse 86  Ht  (1.626 m)  Wt 193 lb (87.544 kg)  BMI 33.11 kg/m2  SpO2 92% ,  BMI Body mass index is 33.11 kg/(m^2). GEN: Well nourished, well developed, in no acute distress HEENT: normal Neck: no JVD, no masses. No carotid bruits Cardiac: RRR without murmur or gallop                Respiratory:  Coarse breath sounds with diffuse rhonchi GI: soft, nontender, nondistended, + BS MS: no deformity or atrophy Ext: no pretibial edema, pedal pulses 2+= bilaterally Skin: warm and dry, no rash Neuro:  Strength and sensation are intact Psych: euthymic mood, full affect  EKG:  EKG is ordered today. The ekg ordered today shows NSR with incomplete RBBB, nonspecific ST change  Recent Labs: No results found for requested labs within last 365 days.   Lipid Panel       Component Value Date/Time   CHOL 179 07/01/2013 0815   TRIG 116.0 07/01/2013 0815   TRIG 199* 08/18/2006 1507   HDL 47.10 07/01/2013 0815   CHOLHDL 4 07/01/2013 0815   CHOLHDL 7.1 CALC 08/18/2006 1507   VLDL 23.2 07/01/2013 0815   LDLCALC 109* 07/01/2013 0815   LDLDIRECT 208.4 08/18/2006 1507      Wt Readings from Last 3 Encounters:  01/10/15 193 lb (87.544 kg)  09/04/14 193 lb (87.544 kg)  08/12/13 194 lb 4 oz (88.111 kg)    ASSESSMENT AND PLAN: 1.  CAD, native vessel: no anginal symptoms on current medical therapy. Medical program reviewed and includes aspirin, atorvastatin, metoprolol, and hydrochlorothiazide.  2. HTN, essential: BP well-controlled on current medications  3. Hyperlipidemia: on high-dose atorvastatin. Lipids last checked in 2014. Needs repeat labs.   4. Asthma/allergies: plan as per PCP. Advised he can try OTC loratadine for allergies.  Current medicines are reviewed with the patient today.  The patient does not have concerns regarding medicines.  The following changes have been made:  no change  Labs/ tests ordered today include:   Orders Placed This Encounter  Procedures  . Lipid Profile  . Hepatic function panel    Disposition:   FU one year  Signed, Tonny BollmanMichael Laaibah Wartman, MD  01/10/2015 10:20 PM    [facility] [address], [place], [place]  [address] Phone: [phone]; Fax: [phone]

## 2015-01-15 ENCOUNTER — Other Ambulatory Visit (INDEPENDENT_AMBULATORY_CARE_PROVIDER_SITE_OTHER): Payer: Medicaid Other | Admitting: *Deleted

## 2015-01-15 DIAGNOSIS — I2510 Atherosclerotic heart disease of native coronary artery without angina pectoris: Secondary | ICD-10-CM

## 2015-01-15 LAB — HEPATIC FUNCTION PANEL
ALT: 17 U/L (ref 0–53)
AST: 17 U/L (ref 0–37)
Albumin: 3.9 g/dL (ref 3.5–5.2)
Alkaline Phosphatase: 94 U/L (ref 39–117)
Bilirubin, Direct: 0 mg/dL (ref 0.0–0.3)
Total Bilirubin: 0.3 mg/dL (ref 0.2–1.2)
Total Protein: 7 g/dL (ref 6.0–8.3)

## 2015-01-15 LAB — LIPID PANEL
Cholesterol: 313 mg/dL — ABNORMAL HIGH (ref 0–200)
HDL: 54.1 mg/dL (ref >39.00)
LDL Cholesterol: 222 mg/dL — ABNORMAL HIGH (ref 0–99)
NonHDL: 258.9
Total CHOL/HDL Ratio: 6
Triglycerides: 183 mg/dL — ABNORMAL HIGH (ref 0.0–149.0)
VLDL: 36.6 mg/dL (ref 0.0–40.0)

## 2015-06-08 ENCOUNTER — Other Ambulatory Visit (INDEPENDENT_AMBULATORY_CARE_PROVIDER_SITE_OTHER): Payer: Medicaid Other

## 2015-06-08 DIAGNOSIS — I2510 Atherosclerotic heart disease of native coronary artery without angina pectoris: Secondary | ICD-10-CM

## 2015-06-08 DIAGNOSIS — E785 Hyperlipidemia, unspecified: Secondary | ICD-10-CM | POA: Diagnosis not present

## 2015-06-08 DIAGNOSIS — R7989 Other specified abnormal findings of blood chemistry: Secondary | ICD-10-CM

## 2015-06-08 LAB — LDL CHOLESTEROL, DIRECT: Direct LDL: 132 mg/dL

## 2015-06-08 LAB — LIPID PANEL
Cholesterol: 210 mg/dL — ABNORMAL HIGH (ref 0–200)
HDL: 46.8 mg/dL (ref >39.00)
NonHDL: 163.45
Total CHOL/HDL Ratio: 4
Triglycerides: 220 mg/dL — ABNORMAL HIGH (ref 0.0–149.0)
VLDL: 44 mg/dL — ABNORMAL HIGH (ref 0.0–40.0)

## 2015-06-08 LAB — HEPATIC FUNCTION PANEL
ALT: 25 U/L (ref 0–53)
AST: 18 U/L (ref 0–37)
Albumin: 4.1 g/dL (ref 3.5–5.2)
Alkaline Phosphatase: 88 U/L (ref 39–117)
Bilirubin, Direct: 0.1 mg/dL (ref 0.0–0.3)
Total Bilirubin: 0.6 mg/dL (ref 0.2–1.2)
Total Protein: 7 g/dL (ref 6.0–8.3)

## 2015-06-08 LAB — CHOLESTEROL, TOTAL: Cholesterol: 210 mg/dL — ABNORMAL HIGH (ref 0–200)

## 2015-06-11 ENCOUNTER — Other Ambulatory Visit: Payer: Self-pay

## 2015-06-11 MED ORDER — EZETIMIBE 10 MG PO TABS
10.0000 mg | ORAL_TABLET | Freq: Every day | ORAL | Status: DC
Start: 2015-06-11 — End: 2016-01-13

## 2015-06-12 ENCOUNTER — Telehealth: Payer: Self-pay

## 2015-06-12 NOTE — Telephone Encounter (Signed)
Prior auth for Zetia  obtained thru [employer]. Auth # [id number], good for one year. Pharmacy notified.

## 2015-12-31 ENCOUNTER — Other Ambulatory Visit: Payer: Self-pay | Admitting: Cardiovascular Disease

## 2016-01-11 ENCOUNTER — Other Ambulatory Visit: Payer: Self-pay | Admitting: Cardiovascular Disease

## 2016-01-12 ENCOUNTER — Encounter (HOSPITAL_COMMUNITY): Payer: Self-pay | Admitting: *Deleted

## 2016-01-12 ENCOUNTER — Observation Stay (HOSPITAL_COMMUNITY)
Admission: EM | Admit: 2016-01-12 | Discharge: 2016-01-13 | Disposition: A | Discharge status: Home / Self Care | Payer: 59 | Attending: Internal Medicine | Admitting: Internal Medicine

## 2016-01-12 DIAGNOSIS — I252 Old myocardial infarction: Secondary | ICD-10-CM | POA: Insufficient documentation

## 2016-01-12 DIAGNOSIS — I2510 Atherosclerotic heart disease of native coronary artery without angina pectoris: Secondary | ICD-10-CM | POA: Insufficient documentation

## 2016-01-12 DIAGNOSIS — I10 Essential (primary) hypertension: Secondary | ICD-10-CM | POA: Diagnosis not present

## 2016-01-12 DIAGNOSIS — I25110 Atherosclerotic heart disease of native coronary artery with unstable angina pectoris: Secondary | ICD-10-CM | POA: Diagnosis present

## 2016-01-12 DIAGNOSIS — E785 Hyperlipidemia, unspecified: Secondary | ICD-10-CM | POA: Diagnosis present

## 2016-01-12 DIAGNOSIS — Z955 Presence of coronary angioplasty implant and graft: Secondary | ICD-10-CM | POA: Insufficient documentation

## 2016-01-12 DIAGNOSIS — J9601 Acute respiratory failure with hypoxia: Secondary | ICD-10-CM | POA: Diagnosis present

## 2016-01-12 DIAGNOSIS — E876 Hypokalemia: Secondary | ICD-10-CM | POA: Diagnosis present

## 2016-01-12 DIAGNOSIS — J069 Acute upper respiratory infection, unspecified: Secondary | ICD-10-CM | POA: Diagnosis not present

## 2016-01-12 DIAGNOSIS — J45901 Unspecified asthma with (acute) exacerbation: Secondary | ICD-10-CM | POA: Diagnosis present

## 2016-01-12 DIAGNOSIS — Z87891 Personal history of nicotine dependence: Secondary | ICD-10-CM | POA: Insufficient documentation

## 2016-01-12 LAB — BASIC METABOLIC PANEL
Anion gap: 11 (ref 5–15)
BUN: 18 mg/dL (ref 6–20)
CO2: 24 mmol/L (ref 22–32)
Calcium: 9 mg/dL (ref 8.9–10.3)
Chloride: 104 mmol/L (ref 101–111)
Creatinine, Ser: 1.07 mg/dL (ref 0.61–1.24)
GFR calc Af Amer: >60 mL/min (ref >60)
GFR calc non Af Amer: >60 mL/min (ref >60)
Glucose, Bld: 114 mg/dL — ABNORMAL HIGH (ref 65–99)
Potassium: 3.2 mmol/L — ABNORMAL LOW (ref 3.5–5.1)
Sodium: 139 mmol/L (ref 135–145)

## 2016-01-12 LAB — CBC
HCT: 41.4 % (ref 39.0–52.0)
Hemoglobin: 13.6 g/dL (ref 13.0–17.0)
MCH: 28.3 pg (ref 26.0–34.0)
MCHC: 32.9 g/dL (ref 30.0–36.0)
MCV: 86.3 fL (ref 78.0–100.0)
Platelets: 255 10*3/uL (ref 150–400)
RBC: 4.8 MIL/uL (ref 4.22–5.81)
RDW: 13.9 % (ref 11.5–15.5)
WBC: 8.4 10*3/uL (ref 4.0–10.5)

## 2016-01-12 LAB — I-STAT TROPONIN, ED: Troponin i, poc: 0.01 ng/mL (ref 0.00–0.08)

## 2016-01-12 MED ORDER — ALBUTEROL SULFATE (2.5 MG/3ML) 0.083% IN NEBU
INHALATION_SOLUTION | RESPIRATORY_TRACT | Status: AC
  Filled 2016-01-12: qty 6

## 2016-01-12 MED ORDER — ALBUTEROL SULFATE (2.5 MG/3ML) 0.083% IN NEBU
5.0000 mg | INHALATION_SOLUTION | Freq: Once | RESPIRATORY_TRACT | Status: AC
  Administered 2016-01-12: 5 mg via RESPIRATORY_TRACT

## 2016-01-12 NOTE — ED Notes (Signed)
Pt has severe asthma and had bypass in 2008.  Pt is here with complaints of shortness of breath, wheezing, coughing up stuff, and chest pain with walking up incline today.  Pt sats were 88% on RA and was placed on 02/2L

## 2016-01-13 ENCOUNTER — Emergency Department (HOSPITAL_COMMUNITY): Payer: 59

## 2016-01-13 DIAGNOSIS — J45901 Unspecified asthma with (acute) exacerbation: Principal | ICD-10-CM

## 2016-01-13 DIAGNOSIS — J9601 Acute respiratory failure with hypoxia: Secondary | ICD-10-CM | POA: Diagnosis present

## 2016-01-13 DIAGNOSIS — I2510 Atherosclerotic heart disease of native coronary artery without angina pectoris: Secondary | ICD-10-CM

## 2016-01-13 DIAGNOSIS — I10 Essential (primary) hypertension: Secondary | ICD-10-CM

## 2016-01-13 DIAGNOSIS — E785 Hyperlipidemia, unspecified: Secondary | ICD-10-CM

## 2016-01-13 DIAGNOSIS — J069 Acute upper respiratory infection, unspecified: Secondary | ICD-10-CM | POA: Diagnosis present

## 2016-01-13 DIAGNOSIS — E876 Hypokalemia: Secondary | ICD-10-CM | POA: Diagnosis present

## 2016-01-13 LAB — CBC
HCT: 39.6 % (ref 39.0–52.0)
Hemoglobin: 13 g/dL (ref 13.0–17.0)
MCH: 28.5 pg (ref 26.0–34.0)
MCHC: 32.8 g/dL (ref 30.0–36.0)
MCV: 86.8 fL (ref 78.0–100.0)
Platelets: 222 10*3/uL (ref 150–400)
RBC: 4.56 MIL/uL (ref 4.22–5.81)
RDW: 14 % (ref 11.5–15.5)
WBC: 7.3 10*3/uL (ref 4.0–10.5)

## 2016-01-13 LAB — BASIC METABOLIC PANEL
Anion gap: 14 (ref 5–15)
BUN: 19 mg/dL (ref 6–20)
CO2: 24 mmol/L (ref 22–32)
Calcium: 8.9 mg/dL (ref 8.9–10.3)
Chloride: 100 mmol/L — ABNORMAL LOW (ref 101–111)
Creatinine, Ser: 1.22 mg/dL (ref 0.61–1.24)
GFR calc Af Amer: >60 mL/min (ref >60)
GFR calc non Af Amer: >60 mL/min (ref >60)
Glucose, Bld: 197 mg/dL — ABNORMAL HIGH (ref 65–99)
Potassium: 2.8 mmol/L — ABNORMAL LOW (ref 3.5–5.1)
Sodium: 138 mmol/L (ref 135–145)

## 2016-01-13 LAB — BRAIN NATRIURETIC PEPTIDE: B Natriuretic Peptide: 49 pg/mL (ref 0.0–100.0)

## 2016-01-13 MED ORDER — ONDANSETRON HCL 4 MG PO TABS: 4.0000 mg | ORAL_TABLET | Freq: Four times a day (QID) | ORAL | Status: DC | PRN

## 2016-01-13 MED ORDER — ACETAMINOPHEN 650 MG RE SUPP: 650.0000 mg | Freq: Four times a day (QID) | RECTAL | Status: DC | PRN

## 2016-01-13 MED ORDER — FUROSEMIDE 10 MG/ML IJ SOLN
20.0000 mg | Freq: Once | INTRAMUSCULAR | Status: AC
  Administered 2016-01-13: 20 mg via INTRAVENOUS
  Filled 2016-01-13: qty 2

## 2016-01-13 MED ORDER — HYDROCHLOROTHIAZIDE 12.5 MG PO CAPS
12.5000 mg | ORAL_CAPSULE | Freq: Every day | ORAL | Status: DC
  Administered 2016-01-13: 12.5 mg via ORAL
  Filled 2016-01-13: qty 1

## 2016-01-13 MED ORDER — SODIUM CHLORIDE 0.9 % IV SOLN
INTRAVENOUS | Status: DC
Start: 2016-01-13 — End: 2016-01-13

## 2016-01-13 MED ORDER — OXYCODONE HCL 5 MG PO TABS: 5.0000 mg | ORAL_TABLET | ORAL | Status: DC | PRN

## 2016-01-13 MED ORDER — PREDNISONE 5 MG PO TABS: ORAL_TABLET | ORAL | Status: DC

## 2016-01-13 MED ORDER — IPRATROPIUM BROMIDE 0.02 % IN SOLN
0.5000 mg | Freq: Once | RESPIRATORY_TRACT | Status: AC
  Administered 2016-01-13: 0.5 mg via RESPIRATORY_TRACT
  Filled 2016-01-13: qty 2.5

## 2016-01-13 MED ORDER — ACETAMINOPHEN 325 MG PO TABS: 650.0000 mg | ORAL_TABLET | Freq: Four times a day (QID) | ORAL | Status: DC | PRN

## 2016-01-13 MED ORDER — HYDROMORPHONE HCL 1 MG/ML IJ SOLN: 0.5000 mg | INTRAMUSCULAR | Status: DC | PRN

## 2016-01-13 MED ORDER — POTASSIUM CHLORIDE 10 MEQ/100ML IV SOLN
10.0000 meq | INTRAVENOUS | Status: AC
  Administered 2016-01-13 (×4): 10 meq via INTRAVENOUS
  Filled 2016-01-13 (×4): qty 100

## 2016-01-13 MED ORDER — IPRATROPIUM-ALBUTEROL 0.5-2.5 (3) MG/3ML IN SOLN
3.0000 mL | RESPIRATORY_TRACT | Status: DC
  Administered 2016-01-13 (×3): 3 mL via RESPIRATORY_TRACT
  Filled 2016-01-13 (×3): qty 3

## 2016-01-13 MED ORDER — METHYLPREDNISOLONE SODIUM SUCC 125 MG IJ SOLR
125.0000 mg | Freq: Once | INTRAMUSCULAR | Status: AC
  Administered 2016-01-13: 125 mg via INTRAVENOUS
  Filled 2016-01-13: qty 2

## 2016-01-13 MED ORDER — METOPROLOL TARTRATE 25 MG PO TABS
25.0000 mg | ORAL_TABLET | Freq: Two times a day (BID) | ORAL | Status: DC
  Administered 2016-01-13: 25 mg via ORAL
  Filled 2016-01-13: qty 1

## 2016-01-13 MED ORDER — POTASSIUM CHLORIDE CRYS ER 20 MEQ PO TBCR
20.0000 meq | EXTENDED_RELEASE_TABLET | Freq: Once | ORAL | Status: AC
  Administered 2016-01-13: 20 meq via ORAL
  Filled 2016-01-13: qty 1

## 2016-01-13 MED ORDER — ENOXAPARIN SODIUM 40 MG/0.4ML ~~LOC~~ SOLN
40.0000 mg | Freq: Every day | SUBCUTANEOUS | Status: DC
  Administered 2016-01-13: 40 mg via SUBCUTANEOUS
  Filled 2016-01-13: qty 0.4

## 2016-01-13 MED ORDER — FLUTICASONE-SALMETEROL 500-50 MCG/DOSE IN AEPB: 1.0000 | INHALATION_SPRAY | Freq: Every morning | RESPIRATORY_TRACT | Status: DC

## 2016-01-13 MED ORDER — ATORVASTATIN CALCIUM 80 MG PO TABS: 80.0000 mg | ORAL_TABLET | Freq: Every day | ORAL | Status: DC

## 2016-01-13 MED ORDER — ASPIRIN EC 81 MG PO TBEC
81.0000 mg | DELAYED_RELEASE_TABLET | Freq: Every day | ORAL | Status: DC
  Administered 2016-01-13: 81 mg via ORAL
  Filled 2016-01-13: qty 1

## 2016-01-13 MED ORDER — LORATADINE 10 MG PO TABS
10.0000 mg | ORAL_TABLET | Freq: Every day | ORAL | Status: DC
  Administered 2016-01-13: 10 mg via ORAL
  Filled 2016-01-13: qty 1

## 2016-01-13 MED ORDER — POTASSIUM CHLORIDE CRYS ER 20 MEQ PO TBCR
40.0000 meq | EXTENDED_RELEASE_TABLET | Freq: Once | ORAL | Status: AC
  Administered 2016-01-13: 40 meq via ORAL
  Filled 2016-01-13: qty 2

## 2016-01-13 MED ORDER — ONDANSETRON HCL 4 MG/2ML IJ SOLN: 4.0000 mg | Freq: Four times a day (QID) | INTRAMUSCULAR | Status: DC | PRN

## 2016-01-13 MED ORDER — ALBUTEROL (5 MG/ML) CONTINUOUS INHALATION SOLN
15.0000 mg/h | INHALATION_SOLUTION | Freq: Once | RESPIRATORY_TRACT | Status: AC
  Administered 2016-01-13: 15 mg/h via RESPIRATORY_TRACT
  Filled 2016-01-13: qty 20

## 2016-01-13 NOTE — H&P (Addendum)
[facility] Admission History and Physical       Kevin Guzman ZHY:[id number] DOB: [birth year] DOA: 01/12/2016  Referring physician:  Rutha BouchardEDEP PCP: Neena RhymesKatherine Tabori, MD  Specialists:   Chief Complaint: SOB and Wheezing  HPI: Kevin Guzman is a 65 y.o. male with a history of CAD, Asthma, HTN, and Hyperlipidemia who presents to the ED with complaints of increased SOB and Wheezing x 2-3 weeks.  He denies any fevers or chills, but he has had chest congestion and cough.   He was found to have hypoxia to 88% in the ED and was placed on 2 liters of NCO2 and had improvement.   He was medicated with nebulizer treatments and IV Solumedrol x 1 but had mild improvement and was referred for admission.      Review of Systems:  Constitutional: No Weight Loss, No Weight Gain, Night Sweats, Fevers, Chills, Dizziness, Light Headedness, Fatigue, or Generalized Weakness HEENT: No Headaches, Difficulty Swallowing,Tooth/Dental Problems,Sore Throat,  No Sneezing, Rhinitis, Ear Ache, Nasal Congestion, or Post Nasal Drip,  Cardio-vascular:  No Chest pain, Orthopnea, PND, Edema in Lower Extremities, Anasarca, Dizziness, Palpitations  Resp:  +Dyspnea, +DOE, +Non-Productive Cough, No Hemoptysis, +Wheezing.    GI: No Heartburn, Indigestion, Abdominal Pain, Nausea, Vomiting, Diarrhea, Constipation, Hematemesis, Hematochezia, Melena, Change in Bowel Habits,  Loss of Appetite  GU: No Dysuria, No Change in Color of Urine, No Urgency or Urinary Frequency, No Flank pain.  Musculoskeletal: No Joint Pain or Swelling, No Decreased Range of Motion, No Back Pain.  Neurologic: No Syncope, No Seizures, Muscle Weakness, Paresthesia, Vision Disturbance or Loss, No Diplopia, No Vertigo, No Difficulty Walking,  Skin: No Rash or Lesions. Psych: No Change in Mood or Affect, No Depression or Anxiety, No Memory loss, No Confusion, or Hallucinations   Past Medical History  Diagnosis Date  . CAD (coronary artery disease)       a. 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 EF 65% at cath 8/08  . HTN (hypertension)   . HLD (hyperlipidemia)   . Asthma   . Testosterone deficiency      Past Surgical History  Procedure Laterality Date  . Cardiac surgery    . Hernia repair        Prior to Admission medications   Medication Sig Start Date End Date Taking? Authorizing Provider  albuterol (PROVENTIL HFA;VENTOLIN HFA) 108 (90 BASE) MCG/ACT inhaler Inhale 2 puffs into the lungs every 4 (four) hours as needed for wheezing or shortness of breath. 12/30/14  Yes Shon Batonourtney F Horton, MD  albuterol (PROVENTIL) (2.5 MG/3ML) 0.083% nebulizer solution Take 2.5 mg by nebulization every 6 (six) hours as needed for wheezing or shortness of breath.   Yes Historical Provider, MD  aspirin 81 MG EC tablet Take 81 mg by mouth daily.     Yes Historical Provider, MD  atorvastatin (LIPITOR) 80 MG tablet TAKE 1 TABLET (80 MG TOTAL) BY MOUTH DAILY AT 6 PM. 01/11/16  Yes Tonny BollmanMichael Cooper, MD  colchicine 0.6 MG tablet 2 tablets at the first sign of gout , then one tab po 1 hour later.  Take with food. Patient taking differently: Take 0.6 mg by mouth daily as needed (gout). 2 tablets at the first sign of gout , then one tab po 1 hour later.  Take with food. 06/21/14  Yes Sheliah HatchKatherine E Tabori, MD  hydrochlorothiazide (MICROZIDE) 12.5 MG capsule Take 1 capsule (12.5 mg total) by mouth daily. 01/10/15  Yes Tonny BollmanMichael Cooper,  MD  loratadine (CLARITIN) 10 MG tablet Take 1 tablet (10 mg total) by mouth daily. 12/30/14  Yes Shon Baton, MD  Melatonin 5 MG CAPS Take 5 mg by mouth daily as needed (sleep).    Yes Historical Provider, MD  metoprolol tartrate (LOPRESSOR) 25 MG tablet TAKE ONE TABLET   BY MOUTH   EVERY 12 HOURS 01/01/16  Yes Tonny Bollman, MD     Allergies  Allergen Reactions  . Almond Meal Anaphylaxis    Social History:  reports that he has quit smoking. He quit smokeless tobacco use about 14 years ago. He reports  that he does not drink alcohol or use illicit drugs.    Family History  Problem Relation Age of Onset  . Heart attack Neg Hx   . Stroke Neg Hx   . Cancer Mother        Physical Exam:  GEN:  Pleasant Obese 65 y.o. Caucasian male examined and in no acute distress; cooperative with exam Filed Vitals:   01/13/16 0231 01/13/16 0245 01/13/16 0300 01/13/16 0330  BP:  135/82 130/84 129/65  Pulse: 69 84 76 85  Resp: SpO2: 95% 96% 98% 97%   Blood pressure 129/65, pulse 85, resp. rate 17, SpO2 97 %. PSYCH: He is alert and oriented x4; does not appear anxious does not appear depressed; affect is normal HEENT: Normocephalic and Atraumatic, Mucous membranes pink; PERRLA; EOM intact; Fundi:  Benign;  No scleral icterus, Nares: Patent, Oropharynx: Clear, Fair Dentition,    Neck:  FROM, No Cervical Lymphadenopathy nor Thyromegaly or Carotid Bruit; No JVD; Breasts:: Not examined CHEST WALL: No tenderness CHEST: Decreased Breath Sounds Diffuse Expiratory Wheezes,  HEART: Regular rate and rhythm; no murmurs rubs or gallops BACK: No kyphosis or scoliosis; No CVA tenderness ABDOMEN: Positive Bowel Sounds, Obese, Soft Non-Tender, No Rebound or Guarding; No Masses, No Organomegaly. Rectal Exam: Not done EXTREMITIES: No Cyanosis, Clubbing, or Edema; No Ulcerations. Genitalia: not examined PULSES: 2+ and symmetric SKIN: Normal hydration no rash or ulceration CNS:  Alert and Oriented x 4, No Focal Deficits Vascular: pulses palpable throughout    Labs on Admission:  Basic Metabolic Panel:  Recent Labs Lab 01/12/16 2311  NA 139  K 3.2*  CL 104  CO2 24  GLUCOSE 114*  BUN 18  CREATININE 1.07  CALCIUM 9.0   Liver Function Tests: No results for input(s): AST, ALT, ALKPHOS, BILITOT, PROT, ALBUMIN in the last 168 hours. No results for input(s): LIPASE, AMYLASE in the last 168 hours. No results for input(s): AMMONIA in the last 168 hours. CBC:  Recent Labs Lab 01/12/16 2311   WBC 8.4  HGB 13.6  HCT 41.4  MCV 86.3  PLT 255   Cardiac Enzymes: No results for input(s): CKTOTAL, CKMB, CKMBINDEX, TROPONINI in the last 168 hours.  BNP (last 3 results) No results for input(s): BNP in the last 8760 hours.  ProBNP (last 3 results) No results for input(s): PROBNP in the last 8760 hours.  CBG: No results for input(s): GLUCAP in the last 168 hours.  Radiological Exams on Admission: Dg Chest 2 View  01/13/2016  CLINICAL DATA:  SOB, cough/cong all x "several weeks" progressively getting worse. Pt states worse when lying down. Ex-smoker. Hx CABG, asthma EXAM: CHEST  2 VIEW COMPARISON:  12/30/2014 FINDINGS: Hyperinflation. Prior median sternotomy. Mild increase in left hemidiaphragm elevation. Midline trachea. Normal heart size and mediastinal contours. No pleural effusion or pneumothorax. Clear lungs. IMPRESSION: Hyperinflation, without acute  disease. Electronically Signed   By: Jeronimo Greaves M.D.   On: 01/13/2016 01:07     EKG: Independently reviewed.     Assessment/Plan:   65 y.o. male with  Principal Problem:    Asthma exacerbation   IV Steroids and taper   Duonebs   O2 PRN   Active Problems:     Acute respiratory failure with hypoxia (HCC)   NCO2 PRN   Monitor O2 sats    Acute URI/Acute Bronchitis- Viral   Supportive Care          Coronary atherosclerosis of native coronary artery   On Metoprolol and Atorvastatin and ASA Rx    Essential hypertension   Continue Metoprolol and HCTZ Rx     Hyperlipemia   continue Atorvastatin Rx      Hypokalemia- due to HCTZ Rx   Replace with 40 meg PO KCL   Check Magnesium       DVT Prophylaxis   Lovenox    Code Status:     FULL CODE     Family Communication:   No Family Present    Disposition Plan:    Inpatient Status        Time spent: 40 Minutes      Ron Parker [facility] Pager [phone]   If 7AM -7PM Please Contact the Day Rounding Team MD for [facility]  If  7PM-7AM, Please Contact Night-Floor Coverage  [email or web address] Password [email or web address] 01/13/2016, 4:26 AM     ADDENDUM:   Patient was seen and examined on 01/13/2016

## 2016-01-13 NOTE — Progress Notes (Signed)
Discharge instructions RX's and follow up appts reviewed/provided to patient verbalized understanding.  Patient discharged  home with family left floor via wheelchair no c/o pain or shortness of breath at d/c.  Thimothy Barretta, Kae HellerMiranda Lynn, RN

## 2016-01-13 NOTE — Discharge Instructions (Signed)
Follow with Primary MD Neena RhymesKatherine Tabori, MD in 2 days   Get CBC, CMP, 2 view Chest X ray checked  by Primary MD next visit.    Activity: As tolerated with Full fall precautions use walker/cane & assistance as needed   Disposition Home     Diet:   Heart Healthy    For Heart failure patients - Check your Weight same time everyday, if you gain over 2 pounds, or you develop in leg swelling, experience more shortness of breath or chest pain, call your Primary MD immediately. Follow Cardiac Low Salt Diet and 1.5 lit/day fluid restriction.   On your next visit with your primary care physician please Get Medicines reviewed and adjusted.   Please request your Prim.MD to go over all Hospital Tests and Procedure/Radiological results at the follow up, please get all Hospital records sent to your Prim MD by signing hospital release before you go home.   If you experience worsening of your admission symptoms, develop shortness of breath, life threatening emergency, suicidal or homicidal thoughts you must seek medical attention immediately by calling 911 or calling your MD immediately  if symptoms less severe.  You Must read complete instructions/literature along with all the possible adverse reactions/side effects for all the Medicines you take and that have been prescribed to you. Take any new Medicines after you have completely understood and accpet all the possible adverse reactions/side effects.   Do not drive, operating heavy machinery, perform activities at heights, swimming or participation in water activities or provide baby sitting services if your were admitted for syncope or siezures until you have seen by Primary MD or a Neurologist and advised to do so again.  Do not drive when taking Pain medications.    Do not take more than prescribed Pain, Sleep and Anxiety Medications  Special Instructions: If you have smoked or chewed Tobacco  in the last 2 yrs please stop smoking, stop any  regular Alcohol  and or any Recreational drug use.  Wear Seat belts while driving.   Please note  You were cared for by a hospitalist during your hospital stay. If you have any questions about your discharge medications or the care you received while you were in the hospital after you are discharged, you can call the unit and asked to speak with the hospitalist on call if the hospitalist that took care of you is not available. Once you are discharged, your primary care physician will handle any further medical issues. Please note that NO REFILLS for any discharge medications will be authorized once you are discharged, as it is imperative that you return to your primary care physician (or establish a relationship with a primary care physician if you do not have one) for your aftercare needs so that they can reassess your need for medications and monitor your lab values.

## 2016-01-13 NOTE — ED Provider Notes (Signed)
TIME SEEN: 1:33 AM  CHIEF COMPLAINT: No chief complaint on file.   HPI:   Kevin Guzman is a 65 y.o. male with a history of asthma, CAD, HTN and HLD, who presents to the Emergency Department complaining of shortness of breath for the past 2 months. States he has been coughing up white sputum. Also had an episode of chest pressure with exertion today that is now gone. This concerned him because he has a history of a CABG in 2008. No history of tobacco use since 2000. No history of CHF. Was hypoxic on room air. Does not wear oxygen at home. No lower extremity swelling or pain. No history of PE or DVT. No fever.     ROS: See HPI Constitutional: no fever  Eyes: no drainage  ENT: no runny nose   Cardiovascular:   chest pain  Resp: SOB  GI: no vomiting GU: no dysuria Integumentary: no rash  Allergy: no hives  Musculoskeletal: no leg swelling  Neurological: no slurred speech ROS otherwise negative  PAST MEDICAL HISTORY/PAST SURGICAL HISTORY:  Past Medical History  Diagnosis Date  . CAD (coronary artery disease)     a. 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 EF 65% at cath 8/08  . HTN (hypertension)   . HLD (hyperlipidemia)   . Asthma   . Testosterone deficiency     MEDICATIONS:  Prior to Admission medications   Medication Sig Start Date End Date Taking? Authorizing Provider  albuterol (PROVENTIL HFA;VENTOLIN HFA) 108 (90 BASE) MCG/ACT inhaler Inhale 2 puffs into the lungs every 4 (four) hours as needed for wheezing or shortness of breath. 12/30/14  Yes Shon Batonourtney F Horton, MD  albuterol (PROVENTIL) (2.5 MG/3ML) 0.083% nebulizer solution Take 2.5 mg by nebulization every 6 (six) hours as needed for wheezing or shortness of breath.   Yes Historical Provider, MD  aspirin 81 MG EC tablet Take 81 mg by mouth daily.     Yes Historical Provider, MD  atorvastatin (LIPITOR) 80 MG tablet TAKE 1 TABLET (80 MG TOTAL) BY MOUTH DAILY AT 6 PM. 01/11/16  Yes Tonny BollmanMichael  Cooper, MD  colchicine 0.6 MG tablet 2 tablets at the first sign of gout , then one tab po 1 hour later.  Take with food. Patient taking differently: Take 0.6 mg by mouth daily as needed (gout). 2 tablets at the first sign of gout , then one tab po 1 hour later.  Take with food. 06/21/14  Yes Sheliah HatchKatherine E Tabori, MD  hydrochlorothiazide (MICROZIDE) 12.5 MG capsule Take 1 capsule (12.5 mg total) by mouth daily. 01/10/15  Yes Tonny BollmanMichael Cooper, MD  loratadine (CLARITIN) 10 MG tablet Take 1 tablet (10 mg total) by mouth daily. 12/30/14  Yes Shon Batonourtney F Horton, MD  Melatonin 5 MG CAPS Take 5 mg by mouth daily as needed (sleep).    Yes Historical Provider, MD  metoprolol tartrate (LOPRESSOR) 25 MG tablet TAKE ONE TABLET   BY MOUTH   EVERY 12 HOURS 01/01/16  Yes Tonny BollmanMichael Cooper, MD    ALLERGIES:  Allergies  Allergen Reactions  . Almond Meal Anaphylaxis    SOCIAL HISTORY:  Social History  Substance Use Topics  . Smoking status: Former Games developermoker  . Smokeless tobacco: Former NeurosurgeonUser    Quit date: 10/13/2001  . Alcohol Use: No    FAMILY HISTORY: Family History  Problem Relation Age of Onset  . Heart attack Neg Hx   . Stroke Neg Hx   .  Cancer Mother     EXAM: BP 122/66 mmHg  Pulse 75  Resp 20  SpO2 92% CONSTITUTIONAL: Alert and oriented and responds appropriately to questions. In mild respiratory distress, afebrile and nontoxic appearing HEAD: Normocephalic EYES: Conjunctivae clear, PERRL ENT: normal nose; no rhinorrhea; moist mucous membranes NECK: Supple, no meningismus, no LAD  CARD: RRR; S1 and S2 appreciated; no murmurs, no clicks, no rubs, no gallops RESP: Patient is mildly tachypneic, speaking short sentences, hypoxia on room air, diffuse x-ray wheezes, diminished at his bases bilaterally, no rhonchi or rales ABD/GI: Normal bowel sounds; non-distended; soft, non-tender, no rebound, no guarding, no peritoneal signs BACK:  The back appears normal and is non-tender to palpation, there is no CVA  tenderness EXT: Normal ROM in all joints; non-tender to palpation; no edema; normal capillary refill; no cyanosis, no calf tenderness or swelling    SKIN: Normal color for age and race; warm; no rash NEURO: Moves all extremities equally, sensation to light touch intact diffusely, cranial nerves II through XII intact PSYCH: The patient's mood and manner are appropriate. Grooming and personal hygiene are appropriate.   EKG Interpretation  Date/Time:  Saturday January 12 2016 23:04:48 EDT Ventricular Rate:  87 PR Interval:  146 QRS Duration: 106 QT Interval:  374 QTC Calculation: 450 R Axis:   -33 Text Interpretation:  Normal sinus rhythm Left axis deviation Abnormal ECG No significant change since last tracing Confirmed by WARD,  DO, KRISTEN ([id number]) on 01/13/2016 1:24:17 AM       MEDICAL DECISION MAKING: Patient here with what appears to be an asthma exacerbation. Did receive albuterol without much improvement. We'll give continuous albuterol, Atrovent, Solu-Medrol. I feel he will need admission if he does not improve. He does have a new oxygen requirement.  So had episode of chest pressure with exertion. Has a significant CAD history. First troponin is negative. EKG shows no ischemic abnormality.  ED PROGRESS: Patient has been on continuous albuterol for 1 hour. He has better aeration is now able to speak full sentences but still having diffuse wheezing. I feel he will need admission for asthma exacerbation. Discussed with Dr. Lovell Sheehan with hospitalist service for admission. She has placed holding orders.  Patient updated with this plan.   CRITICAL CARE Performed by: Raelyn Number   Total critical care time: 35 minutes  Critical care time was exclusive of separately billable procedures and treating other patients.  Critical care was necessary to treat or prevent imminent or life-threatening deterioration.  Critical care was time spent personally by me on the following activities:  development of treatment plan with patient and/or surrogate as well as nursing, discussions with consultants, evaluation of patient's response to treatment, examination of patient, obtaining history from patient or surrogate, ordering and performing treatments and interventions, ordering and review of laboratory studies, ordering and review of radiographic studies, pulse oximetry and re-evaluation of patient's condition.      Layla Maw Ward, DO 01/13/16 [id number]

## 2016-01-13 NOTE — Discharge Summary (Signed)
Eriberto Felch, is a 65 y.o. male  DOB [birth year]  MRN [id number].  Admission date:  01/12/2016  Admitting Physician  Ron Parker, MD  Discharge Date:  01/13/2016   Primary MD  Neena Rhymes, MD  Recommendations for primary care physician for things to follow:   Check BMP and magnesium in 2 days. Outpatient cardiology and pulmonary follow-up   Admission Diagnosis  Asthma exacerbation [J45.901]   Discharge Diagnosis  Asthma exacerbation [J45.901]    Principal Problem:   Asthma exacerbation Active Problems:   Hyperlipemia   Essential hypertension   Coronary atherosclerosis of native coronary artery   Acute respiratory failure with hypoxia (HCC)   Hypokalemia   Viral URI      Past Medical History  Diagnosis Date  . CAD (coronary artery disease)     a. 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 EF 65% at cath 8/08  . HTN (hypertension)   . HLD (hyperlipidemia)   . Asthma   . Testosterone deficiency     Past Surgical History  Procedure Laterality Date  . Cardiac surgery    . Hernia repair         HPI  from the history and physical done on the day of admission:    Sylvester Minton is a 65 y.o. male with a history of CAD, Asthma, HTN, and Hyperlipidemia who presents to the ED with complaints of increased SOB and Wheezing x 2-3 weeks. He denies any fevers or chills, but he has had chest congestion and cough. He was found to have hypoxia to 88% in the ED and was placed on 2 liters of NCO2 and had improvement. He was medicated with nebulizer treatments and IV Solumedrol x 1 but had mild improvement and was referred for admission.     Hospital Course:     1.Acute hypoxic respiratory failure due to asthma exacerbation in a patient with history of asthma - per  patient for the last 3-4 months he has had several issues with his asthma, he was treated here with IV steroids along with nebulizer treatments. Much better this morning. No oxygen demand, no shortness of breath or wheezing. Will be placed on a oral steroid taper, we will add Advair, continue hand-held albuterol inhaler along with home nebulizer treatments. Continue Claritin at home dose. Note patient is noted to be on aspirin and also a beta blocker, will have him follow with a pulmonologist one time post discharge.  2. CAD. Status post CABG. Under the care of Dr. Excell Seltzer. Do not think patient had any element of CHF however he does say that from time to time he developed some mild orthopnea, BNP was stable, no fluid overload on exam, chest x-ray stable. We will have him follow with his primary cardiologist within a week. Home medications which include aspirin, beta blocker and HCTZ will be continued.  3. Dyslipidemia. Continue home dose statin.  4. Hypokalemia. Replaced IV and orally, request PCP to check BMP and  magnesium in 2 days.       Discharge Condition: Stable  Follow UP  Follow-up Information    Follow up with Neena Rhymes, MD. Schedule an appointment as soon as possible for a visit in 2 days.   Specialty:  Family Medicine   Why:  BMP check   Contact information:   [address] [place] [address] [phone]       Follow up with Tonny Bollman, MD. Schedule an appointment as soon as possible for a visit in 1 week.   Specialty:  Cardiology   Contact information:   [address] [place] [address] [phone]       Follow up with [facility], MD. Schedule an appointment as soon as possible for a visit in 1 week.   Specialty:  Pulmonary Disease   Contact information:   [address] [place] [address] [phone]        Consults obtained - None  Diet and Activity recommendation: See Discharge Instructions  below  Discharge Instructions       Discharge Instructions    Diet - low sodium heart healthy    Complete by:  As directed      Discharge instructions    Complete by:  As directed   Follow with Primary MD Neena Rhymes, MD in 2 days   Get CBC, CMP, 2 view Chest X ray checked  by Primary MD next visit.    Activity: As tolerated with Full fall precautions use walker/cane & assistance as needed   Disposition Home     Diet:   Heart Healthy    For Heart failure patients - Check your Weight same time everyday, if you gain over 2 pounds, or you develop in leg swelling, experience more shortness of breath or chest pain, call your Primary MD immediately. Follow Cardiac Low Salt Diet and 1.5 lit/day fluid restriction.   On your next visit with your primary care physician please Get Medicines reviewed and adjusted.   Please request your Prim.MD to go over all Hospital Tests and Procedure/Radiological results at the follow up, please get all Hospital records sent to your Prim MD by signing hospital release before you go home.   If you experience worsening of your admission symptoms, develop shortness of breath, life threatening emergency, suicidal or homicidal thoughts you must seek medical attention immediately by calling 911 or calling your MD immediately  if symptoms less severe.  You Must read complete instructions/literature along with all the possible adverse reactions/side effects for all the Medicines you take and that have been prescribed to you. Take any new Medicines after you have completely understood and accpet all the possible adverse reactions/side effects.   Do not drive, operating heavy machinery, perform activities at heights, swimming or participation in water activities or provide baby sitting services if your were admitted for syncope or siezures until you have seen by Primary MD or a Neurologist and advised to do so again.  Do not drive when taking Pain medications.     Do not take more than prescribed Pain, Sleep and Anxiety Medications  Special Instructions: If you have smoked or chewed Tobacco  in the last 2 yrs please stop smoking, stop any regular Alcohol  and or any Recreational drug use.  Wear Seat belts while driving.   Please note  You were cared for by a hospitalist during your hospital stay. If you have any questions about your discharge medications or  the care you received while you were in the hospital after you are discharged, you can call the unit and asked to speak with the hospitalist on call if the hospitalist that took care of you is not available. Once you are discharged, your primary care physician will handle any further medical issues. Please note that NO REFILLS for any discharge medications will be authorized once you are discharged, as it is imperative that you return to your primary care physician (or establish a relationship with a primary care physician if you do not have one) for your aftercare needs so that they can reassess your need for medications and monitor your lab values.     Increase activity slowly    Complete by:  As directed              Discharge Medications       Medication List    TAKE these medications        albuterol (2.5 MG/3ML) 0.083% nebulizer solution  Commonly known as:  PROVENTIL  Take 2.5 mg by nebulization every 6 (six) hours as needed for wheezing or shortness of breath.     albuterol 108 (90 Base) MCG/ACT inhaler  Commonly known as:  PROVENTIL HFA;VENTOLIN HFA  Inhale 2 puffs into the lungs every 4 (four) hours as needed for wheezing or shortness of breath.     aspirin 81 MG EC tablet  Take 81 mg by mouth daily.     atorvastatin 80 MG tablet  Commonly known as:  LIPITOR  TAKE 1 TABLET (80 MG TOTAL) BY MOUTH DAILY AT 6 PM.     colchicine 0.6 MG tablet  2 tablets at the first sign of gout , then one tab po 1 hour later.  Take with food.     Fluticasone-Salmeterol 500-50  MCG/DOSE Aepb  Commonly known as:  ADVAIR DISKUS  Inhale 1 puff into the lungs every morning.     hydrochlorothiazide 12.5 MG capsule  Commonly known as:  MICROZIDE  Take 1 capsule (12.5 mg total) by mouth daily.     loratadine 10 MG tablet  Commonly known as:  CLARITIN  Take 1 tablet (10 mg total) by mouth daily.     Melatonin 5 MG Caps  Take 5 mg by mouth daily as needed (sleep).     metoprolol tartrate 25 MG tablet  Commonly known as:  LOPRESSOR  TAKE ONE TABLET   BY MOUTH   EVERY 12 HOURS     predniSONE 5 MG tablet  Commonly known as:  DELTASONE  Label  & dispense according to the schedule below. 10 Pills PO for 3 days then, 8 Pills PO for 3 days, 6 Pills PO for 3 days, 4 Pills PO for 3 days, 2 Pills PO for 3 days, 1 Pills PO for 3 days, 1/2 Pill  PO for 3 days then STOP. Total 95 pills.        Major procedures and Radiology Reports - PLEASE review detailed and final reports for all details, in brief -     Dg Chest 2 View  01/13/2016  CLINICAL DATA:  SOB, cough/cong all x "several weeks" progressively getting worse. Pt states worse when lying down. Ex-smoker. Hx CABG, asthma EXAM: CHEST  2 VIEW COMPARISON:  12/30/2014 FINDINGS: Hyperinflation. Prior median sternotomy. Mild increase in left hemidiaphragm elevation. Midline trachea. Normal heart size and mediastinal contours. No pleural effusion or pneumothorax. Clear lungs. IMPRESSION: Hyperinflation, without acute disease. Electronically Signed   By: Ronaldo Miyamoto  Reche Dixonalbot M.D.   On: 01/13/2016 01:07    Micro Results     No results found for this or any previous visit (from the past 240 hour(s)).   Today   Subjective    Kyla Balzarineicholas Maggard today has no headache,no chest abdominal pain,no new weakness tingling or numbness, feels much better wants to go home today.     Objective   Blood pressure 123/67, pulse 93, temperature 97.5 F (36.4 C), temperature source Oral, resp. rate 18, height 5\' 4"  (1.626 m), weight 86.183 kg (190  lb), SpO2 92 %.   Intake/Output Summary (Last 24 hours) at 01/13/16 1110 Last data filed at 01/13/16 0917  Gross per 24 hour  Intake    240 ml  Output      0 ml  Net    240 ml    Exam Awake Alert, Oriented x 3, No new F.N deficits, Normal affect [place].AT,PERRAL Supple Neck,No JVD, No cervical lymphadenopathy appriciated.  Symmetrical Chest wall movement, Good air movement bilaterally, CTAB RRR,No Gallops,Rubs or new Murmurs, No Parasternal Heave +ve B.Sounds, Abd Soft, Non tender, No organomegaly appriciated, No rebound -guarding or rigidity. No Cyanosis, Clubbing or edema, No new Rash or bruise   Data Review   CBC w Diff: Lab Results  Component Value Date   WBC 7.3 01/13/2016   HGB 13.0 01/13/2016   HCT 39.6 01/13/2016   PLT 222 01/13/2016   LYMPHOPCT 25.8 07/01/2013   MONOPCT 6.6 07/01/2013   EOSPCT 6.0* 07/01/2013   BASOPCT 0.7 07/01/2013    CMP: Lab Results  Component Value Date   NA 138 01/13/2016   K 2.8* 01/13/2016   CL 100* 01/13/2016   CO2 24 01/13/2016   BUN 19 01/13/2016   CREATININE 1.22 01/13/2016   PROT 7.0 06/08/2015   ALBUMIN 4.1 06/08/2015   BILITOT 0.6 06/08/2015   ALKPHOS 88 06/08/2015   AST 18 06/08/2015   ALT 25 06/08/2015  .   Total Time in preparing paper work, data evaluation and todays exam - 35 minutes  Leroy SeaSINGH,Kazandra Forstrom K M.D on 01/13/2016 at 11:10 AM  [facility]   Office  [phone]

## 2016-01-14 ENCOUNTER — Telehealth: Payer: Self-pay | Admitting: *Deleted

## 2016-01-14 NOTE — Telephone Encounter (Signed)
Unable to reach patient at time of call. Left message for patient to return call when available. Pt needs to be scheduled for 30 minute hosp follow up this week. 01/15/16 if possible, as discharge summary recommends labs 2 days post d/c.

## 2016-01-15 NOTE — Telephone Encounter (Signed)
Unable to reach patient at time of TCM call. Left message for patient to return call when available.   

## 2016-01-17 ENCOUNTER — Encounter: Payer: Self-pay | Admitting: Cardiovascular Disease

## 2016-01-17 ENCOUNTER — Other Ambulatory Visit: Payer: Self-pay | Admitting: Cardiovascular Disease

## 2016-01-21 ENCOUNTER — Ambulatory Visit (INDEPENDENT_AMBULATORY_CARE_PROVIDER_SITE_OTHER): Payer: 59 | Admitting: Cardiology

## 2016-01-21 ENCOUNTER — Encounter: Payer: Self-pay | Admitting: Cardiology

## 2016-01-21 VITALS — BP 110/60 | HR 62 | Ht 64.0 in | Wt 187.2 lb

## 2016-01-21 DIAGNOSIS — I2510 Atherosclerotic heart disease of native coronary artery without angina pectoris: Secondary | ICD-10-CM | POA: Diagnosis not present

## 2016-01-21 NOTE — Patient Instructions (Signed)
Medication Instructions:  Your physician recommends that you continue on your current medications as directed. Please refer to the Current Medication list given to you today.   Labwork: None ordered  Testing/Procedures: None ordered  Follow-Up: Follow up as planned in June 2017 with Dr.Cooper  Any Other Special Instructions Will Be Listed Below (If Applicable).     If you need a refill on your cardiac medications before your next appointment, please call your pharmacy.

## 2016-01-21 NOTE — Progress Notes (Signed)
01/21/2016 [name]   [birth year]  [id number]  Primary Physician Neena Rhymes, MD Primary Cardiologist: Dr. Excell Seltzer   Reason for Visit/CC: Post ED F/u for Asthma Exacerbation  HPI:  65 y/o male, followed by Dr. Excell Seltzer, with a h/o CAD, HTN and HLD.  He initially presented in 2008 with a non-ST elevation infarction. He had severe 2 vessel coronary artery disease and underwent coronary bypass surgery. He was last seen by Dr. Excell Seltzer 01/10/15 for routien f/u was was noted to be stable from a cardiac standpoint, without anginal symptomotology. He was instructed to f/u in 1 year.  Several days ago, he presented to the ED with SOB and wheezing, felt to be secondary to acute ashtma exacerbation. CXR was negative and BNP was normal at 49. He was treated with steroids and bronchodilators. He was advised to f/u in our office.   He reports that he has done well. His breathing has improved with daily use of Advair. He has not been compliant with prednisone as he was confused about the dosing (was discharged on a taper dose). Patient has f/u with his PCP tomorrow and will further discuss. He was reassured that given his normal BNP and CXR, his dyspnea was in fact likely secondary to asthma and not CHF.     Current Outpatient Prescriptions  Medication Sig Dispense Refill  . albuterol (PROVENTIL HFA;VENTOLIN HFA) 108 (90 BASE) MCG/ACT inhaler Inhale 2 puffs into the lungs every 4 (four) hours as needed for wheezing or shortness of breath. 1 Inhaler 0  . albuterol (PROVENTIL) (2.5 MG/3ML) 0.083% nebulizer solution Take 2.5 mg by nebulization every 6 (six) hours as needed for wheezing or shortness of breath.    Marland Kitchen aspirin 81 MG EC tablet Take 81 mg by mouth daily.      Marland Kitchen atorvastatin (LIPITOR) 80 MG tablet TAKE 1 TABLET (80 MG TOTAL) BY MOUTH DAILY AT 6 PM. 90 tablet 0  . colchicine 0.6 MG tablet Take 0.6 mg by mouth daily as needed (FOR GOUT FLARE UP).    . Fluticasone-Salmeterol (ADVAIR DISKUS)  500-50 MCG/DOSE AEPB Inhale 1 puff into the lungs every morning. 60 each 0  . hydrochlorothiazide (MICROZIDE) 12.5 MG capsule TAKE 1 CAPSULE (12.5 MG TOTAL) BY MOUTH DAILY. 90 capsule 3  . loratadine (CLARITIN) 10 MG tablet Take 1 tablet (10 mg total) by mouth daily. 30 tablet 0  . Melatonin 5 MG CAPS Take 5 mg by mouth daily as needed (sleep).     . metoprolol tartrate (LOPRESSOR) 25 MG tablet TAKE ONE TABLET   BY MOUTH   EVERY 12 HOURS 60 tablet 0  . predniSONE (DELTASONE) 5 MG tablet Label  & dispense according to the schedule below. 10 Pills PO for 3 days then, 8 Pills PO for 3 days, 6 Pills PO for 3 days, 4 Pills PO for 3 days, 2 Pills PO for 3 days, 1 Pills PO for 3 days, 1/2 Pill  PO for 3 days then STOP. Total 95 pills. (Patient not taking: Reported on 01/21/2016) 95 tablet 0   No current facility-administered medications for this visit.    Allergies  Allergen Reactions  . Almond Meal Anaphylaxis  . Almond Oil Swelling  . Ace Inhibitors     Social History   Social History  . Marital Status: Married    Spouse Name: N/A  . Number of Children: N/A  . Years of Education: N/A   Occupational History  . Not on file.   Social History Main  Topics  . Smoking status: Former Games developermoker  . Smokeless tobacco: Former NeurosurgeonUser    Quit date: 10/13/2001  . Alcohol Use: No  . Drug Use: No  . Sexual Activity: Not on file   Other Topics Concern  . Not on file   Social History Narrative     Review of Systems: General: negative for chills, fever, night sweats or weight changes.  Cardiovascular: negative for chest pain, dyspnea on exertion, edema, orthopnea, palpitations, paroxysmal nocturnal dyspnea or shortness of breath Dermatological: negative for rash Respiratory: negative for cough or wheezing Urologic: negative for hematuria Abdominal: negative for nausea, vomiting, diarrhea, bright red blood per rectum, melena, or hematemesis Neurologic: negative for visual changes, syncope, or  dizziness All other systems reviewed and are otherwise negative except as noted above.    Blood pressure 110/60, pulse 62, height 5\' 4"  (1.626 m), weight 187 lb 3.2 oz (84.913 kg).  General appearance: alert, cooperative, no distress and moderately obese Neck: no carotid bruit and no JVD Lungs: mild expiratory wheezing in biltateral lung fields. No rales or rhonci Heart: regular rate and rhythm, S1, S2 normal, no murmur, click, rub or gallop Extremities: no LEE Pulses: 2+ and symmetric Skin: warm and dry Neurologic: Grossly normal  EKG not performed  ASSESSMENT AND PLAN:    1. CAD: s/p NSTEMI in 2008 treated with CABG. Stable w/o anginal symptomatology. Continue medical therapy.   2. Asthma: acute exacerbation improved. Still with mild expiratory wheezing on lung exam. He notes improved compliance with daily Advair. He has overall poor health literacy when it comes to managing his asthma. We discussed the importance of daily compliance with is LABA despite lack of symptoms to prevent chronic inflammation. He was given patient education materials and instructed to continue with plans to f/u with his PCP tomorrow to further discuss.   3. HTN:  BP is well controlled on current regimen.   4. HLD: on statin therapy with Lipitor. F/u LFTs when he follows up with Dr. Excell Seltzerooper in June.   PLAN  Stable from a cardiac standpoint. Keep f/u with Dr. Excell Seltzerooper in June.   Robbie LisSIMMONS, BRITTAINY PA-C 01/21/2016 11:52 AM

## 2016-03-07 ENCOUNTER — Encounter: Payer: Self-pay | Admitting: Family Medicine

## 2016-03-21 ENCOUNTER — Ambulatory Visit (INDEPENDENT_AMBULATORY_CARE_PROVIDER_SITE_OTHER): Payer: 59 | Admitting: Cardiovascular Disease

## 2016-03-21 ENCOUNTER — Encounter: Payer: Self-pay | Admitting: Cardiovascular Disease

## 2016-03-21 VITALS — BP 114/68 | HR 72 | Ht 64.0 in | Wt 187.8 lb

## 2016-03-21 DIAGNOSIS — I2510 Atherosclerotic heart disease of native coronary artery without angina pectoris: Secondary | ICD-10-CM

## 2016-03-21 NOTE — Progress Notes (Signed)
Cardiology Office Note Date:  03/21/2016   ID:  Kevin BalzarineNicholas Guzman, DOB 08-30-1951, MRN [id number]  PCP:  [name], Kevin Lamonica, MD  Cardiologist:  Tonny Bollmanooper, Synthia Fairbank, MD    Chief Complaint  Patient presents with  . Shortness of Breath   History of Present Illness: Kevin Guzman is a 65 y.o. male who presents for follow-up evaluation. Patient is followed for coronary artery disease, hypertension, and hyperlipidemia. He initially presented in 2008 with a non-STEMI. He underwent multivessel CABG because of severe multivessel coronary disease. Bypass anatomy is LIMA-LAD, SVG-diagonal, SVG-OM, SVG-ramus. He's had no recurrent ischemic events.  The patient had an exacerbation of asthma this past spring. He is now doing better on Q-Var. He also uses albuterol as needed. He denies chest pain or pressure. He denies leg swelling, orthopnea, or PND. He recently had extensive blood work done at his work. He was told everything looks pretty good and they advised him to lose weight.   Past Medical History  Diagnosis Date  . CAD (coronary artery disease)     a. 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 EF 65% at cath 8/08  . HTN (hypertension)   . HLD (hyperlipidemia)   . Asthma   . Testosterone deficiency     Past Surgical History  Procedure Laterality Date  . Cardiac surgery    . Hernia repair      Current Outpatient Prescriptions  Medication Sig Dispense Refill  . albuterol (PROVENTIL HFA;VENTOLIN HFA) 108 (90 BASE) MCG/ACT inhaler Inhale 2 puffs into the lungs every 4 (four) hours as needed for wheezing or shortness of breath. 1 Inhaler 0  . albuterol (PROVENTIL) (2.5 MG/3ML) 0.083% nebulizer solution Take 2.5 mg by nebulization every 6 (six) hours as needed for wheezing or shortness of breath.    Marland Kitchen. aspirin 81 MG EC tablet Take 81 mg by mouth daily.      Marland Kitchen. atorvastatin (LIPITOR) 80 MG tablet TAKE 1 TABLET (80 MG TOTAL) BY MOUTH DAILY AT 6 PM. 90 tablet 0  .  beclomethasone (QVAR) 80 MCG/ACT inhaler Inhale 1 puff into the lungs 2 (two) times daily.    . colchicine 0.6 MG tablet Take 0.6 mg by mouth daily as needed (FOR GOUT FLARE UP).    . hydrochlorothiazide (MICROZIDE) 12.5 MG capsule TAKE 1 CAPSULE (12.5 MG TOTAL) BY MOUTH DAILY. 90 capsule 3  . Melatonin 5 MG CAPS Take 5 mg by mouth daily as needed (sleep).     . metoprolol tartrate (LOPRESSOR) 25 MG tablet TAKE ONE TABLET   BY MOUTH   EVERY 12 HOURS 60 tablet 0   No current facility-administered medications for this visit.    Allergies:   Almond meal; Almond oil; and Ace inhibitors   Social History:  The patient  reports that he has quit smoking. He quit smokeless tobacco use about 14 years ago. He reports that he does not drink alcohol or use illicit drugs.   Family History:  The patient's  family history includes Cancer in his mother. There is no history of Heart attack or Stroke.    ROS:  Please see the history of present illness.  Otherwise, review of systems is positive for shortness of breath with exertion, muscle pain, cough, leg pain.  All other systems are reviewed and negative.     PHYSICAL EXAM: VS:  BP 114/68 mmHg  Pulse 72  Ht 5\' 4"  (1.626 m)  Wt 187 lb 12.8 oz (85.186  kg)  BMI 32.22 kg/m2 , BMI Body mass index is 32.22 kg/(m^2). GEN: Well nourished, well developed, pleasant overweight male in no acute distress HEENT: normal Neck: no JVD, no masses. No carotid bruits Cardiac: RRR without murmur or gallop                Respiratory:  clear to auscultation bilaterally, normal work of breathing GI: soft, nontender, nondistended, + BS MS: no deformity or atrophy Ext: no pretibial edema, pedal pulses 2+= bilaterally Skin: warm and dry, no rash Neuro:  Strength and sensation are intact Psych: euthymic mood, full affect  EKG:  EKG is not ordered today. The ekg from 01-12-2016 shows NSR with left axis deviation, no acute ST-T changes  Recent Labs: 06/08/2015: ALT  25 01/13/2016: B Natriuretic Peptide 49.0; BUN 19; Creatinine, Ser 1.22; Hemoglobin 13.0; Platelets 222; Potassium 2.8*; Sodium 138   Lipid Panel     Component Value Date/Time   CHOL 210* 06/08/2015 1732   TRIG 220.0* 06/08/2015 1732   TRIG 199* 08/18/2006 1507   HDL 46.80 06/08/2015 1732   CHOLHDL 4 06/08/2015 1732   CHOLHDL 7.1 CALC 08/18/2006 1507   VLDL 44.0* 06/08/2015 1732   LDLCALC 222* 01/15/2015 1322   LDLDIRECT 132.0 06/08/2015 1732   LDLDIRECT 208.4 08/18/2006 1507      Wt Readings from Last 3 Encounters:  03/21/16 187 lb 12.8 oz (85.186 kg)  01/21/16 187 lb 3.2 oz (84.913 kg)  01/13/16 190 lb (86.183 kg)     Cardiac Studies Reviewed: CXR 01/13/2016: FINDINGS: Hyperinflation. Prior median sternotomy. Mild increase in left hemidiaphragm elevation. Midline trachea. Normal heart size and mediastinal contours. No pleural effusion or pneumothorax. Clear lungs.  IMPRESSION: Hyperinflation, without acute disease.  ASSESSMENT AND PLAN: 1.  CAD, native vessel: No symptoms of angina. Needs work on lifestyle modification. Discussed diet/exercise/weight loss strategies.  2. Essential hypertension: BP well-controlled on current Rx.   3. Hyperlipidemia: on atorvastatin 80 mg. He is given our fax # so his labs can be sent for review.   Current medicines are reviewed with the patient today.  The patient does not have concerns regarding medicines.  Labs/ tests ordered today include:  No orders of the defined types were placed in this encounter.   Disposition:   FU one year  Signed, Tonny Bollman, MD  03/21/2016 5:44 PM    [facility] [address] [place], [place], [place]  [address] Phone: [phone]; Fax: [phone]

## 2016-03-21 NOTE — Patient Instructions (Signed)
Medication Instructions:  Your physician recommends that you continue on your current medications as directed. Please refer to the Current Medication list given to you today.  Labwork: No new orders.   Please fax recent lab results to [phone], Attn: Dr Excell Seltzerooper  Testing/Procedures: No new orders.   Follow-Up: Your physician wants you to follow-up in: 1 YEAR with Dr Excell Seltzerooper.  You will receive a reminder letter in the mail two months in advance. If you don't receive a letter, please call our office to schedule the follow-up appointment.   Any Other Special Instructions Will Be Listed Below (If Applicable).     If you need a refill on your cardiac medications before your next appointment, please call your pharmacy.

## 2016-03-25 ENCOUNTER — Other Ambulatory Visit: Payer: Self-pay | Admitting: Cardiovascular Disease

## 2016-07-05 ENCOUNTER — Emergency Department (HOSPITAL_BASED_OUTPATIENT_CLINIC_OR_DEPARTMENT_OTHER): Payer: 59

## 2016-07-05 ENCOUNTER — Encounter (HOSPITAL_BASED_OUTPATIENT_CLINIC_OR_DEPARTMENT_OTHER): Payer: Self-pay | Admitting: Emergency Medicine

## 2016-07-05 ENCOUNTER — Observation Stay (HOSPITAL_BASED_OUTPATIENT_CLINIC_OR_DEPARTMENT_OTHER)
Admission: EM | Admit: 2016-07-05 | Discharge: 2016-07-09 | DRG: 603 | Disposition: A | Discharge status: Home / Self Care | Payer: 59 | Attending: Internal Medicine | Admitting: Internal Medicine

## 2016-07-05 DIAGNOSIS — Z809 Family history of malignant neoplasm, unspecified: Secondary | ICD-10-CM | POA: Diagnosis not present

## 2016-07-05 DIAGNOSIS — I25110 Atherosclerotic heart disease of native coronary artery with unstable angina pectoris: Secondary | ICD-10-CM | POA: Diagnosis present

## 2016-07-05 DIAGNOSIS — Z91018 Allergy to other foods: Secondary | ICD-10-CM

## 2016-07-05 DIAGNOSIS — Z23 Encounter for immunization: Secondary | ICD-10-CM | POA: Diagnosis not present

## 2016-07-05 DIAGNOSIS — Z7982 Long term (current) use of aspirin: Secondary | ICD-10-CM

## 2016-07-05 DIAGNOSIS — L0390 Cellulitis, unspecified: Secondary | ICD-10-CM | POA: Diagnosis present

## 2016-07-05 DIAGNOSIS — I252 Old myocardial infarction: Secondary | ICD-10-CM

## 2016-07-05 DIAGNOSIS — M10071 Idiopathic gout, right ankle and foot: Secondary | ICD-10-CM | POA: Diagnosis present

## 2016-07-05 DIAGNOSIS — Z87891 Personal history of nicotine dependence: Secondary | ICD-10-CM

## 2016-07-05 DIAGNOSIS — M7989 Other specified soft tissue disorders: Secondary | ICD-10-CM

## 2016-07-05 DIAGNOSIS — D649 Anemia, unspecified: Secondary | ICD-10-CM | POA: Diagnosis not present

## 2016-07-05 DIAGNOSIS — L03115 Cellulitis of right lower limb: Secondary | ICD-10-CM | POA: Diagnosis not present

## 2016-07-05 DIAGNOSIS — I10 Essential (primary) hypertension: Secondary | ICD-10-CM | POA: Diagnosis not present

## 2016-07-05 DIAGNOSIS — J45909 Unspecified asthma, uncomplicated: Secondary | ICD-10-CM | POA: Diagnosis present

## 2016-07-05 DIAGNOSIS — Z888 Allergy status to other drugs, medicaments and biological substances status: Secondary | ICD-10-CM

## 2016-07-05 DIAGNOSIS — E785 Hyperlipidemia, unspecified: Secondary | ICD-10-CM | POA: Diagnosis present

## 2016-07-05 DIAGNOSIS — I2510 Atherosclerotic heart disease of native coronary artery without angina pectoris: Secondary | ICD-10-CM | POA: Diagnosis not present

## 2016-07-05 DIAGNOSIS — Z79899 Other long term (current) drug therapy: Secondary | ICD-10-CM | POA: Diagnosis not present

## 2016-07-05 DIAGNOSIS — Z951 Presence of aortocoronary bypass graft: Secondary | ICD-10-CM

## 2016-07-05 DIAGNOSIS — M79671 Pain in right foot: Secondary | ICD-10-CM

## 2016-07-05 DIAGNOSIS — E291 Testicular hypofunction: Secondary | ICD-10-CM | POA: Diagnosis not present

## 2016-07-05 DIAGNOSIS — M25469 Effusion, unspecified knee: Secondary | ICD-10-CM

## 2016-07-05 DIAGNOSIS — M109 Gout, unspecified: Secondary | ICD-10-CM | POA: Diagnosis present

## 2016-07-05 LAB — CBC WITH DIFFERENTIAL/PLATELET
Basophils Absolute: 0 10*3/uL (ref 0.0–0.1)
Basophils Relative: 0 %
Eosinophils Absolute: 0.1 10*3/uL (ref 0.0–0.7)
Eosinophils Relative: 0 %
HCT: 38.5 % — ABNORMAL LOW (ref 39.0–52.0)
Hemoglobin: 12.9 g/dL — ABNORMAL LOW (ref 13.0–17.0)
Lymphocytes Relative: 12 %
Lymphs Abs: 1.3 10*3/uL (ref 0.7–4.0)
MCH: 29.5 pg (ref 26.0–34.0)
MCHC: 33.5 g/dL (ref 30.0–36.0)
MCV: 88.1 fL (ref 78.0–100.0)
Monocytes Absolute: 1 10*3/uL (ref 0.1–1.0)
Monocytes Relative: 9 %
Neutro Abs: 8.8 10*3/uL — ABNORMAL HIGH (ref 1.7–7.7)
Neutrophils Relative %: 79 %
Platelets: 228 10*3/uL (ref 150–400)
RBC: 4.37 MIL/uL (ref 4.22–5.81)
RDW: 13.3 % (ref 11.5–15.5)
WBC: 11.3 10*3/uL — ABNORMAL HIGH (ref 4.0–10.5)

## 2016-07-05 LAB — C-REACTIVE PROTEIN: CRP: 23.5 mg/dL — ABNORMAL HIGH

## 2016-07-05 LAB — BASIC METABOLIC PANEL
Anion gap: 8 (ref 5–15)
BUN: 20 mg/dL (ref 6–20)
CO2: 29 mmol/L (ref 22–32)
Calcium: 8.8 mg/dL — ABNORMAL LOW (ref 8.9–10.3)
Chloride: 100 mmol/L — ABNORMAL LOW (ref 101–111)
Creatinine, Ser: 1.04 mg/dL (ref 0.61–1.24)
GFR calc Af Amer: >60 mL/min (ref >60)
GFR calc non Af Amer: >60 mL/min (ref >60)
Glucose, Bld: 108 mg/dL — ABNORMAL HIGH (ref 65–99)
Potassium: 3.8 mmol/L (ref 3.5–5.1)
Sodium: 137 mmol/L (ref 135–145)

## 2016-07-05 LAB — SEDIMENTATION RATE: Sed Rate: 68 mm/hr — ABNORMAL HIGH (ref 0–16)

## 2016-07-05 MED ORDER — CLINDAMYCIN PHOSPHATE 600 MG/50ML IV SOLN
600.0000 mg | Freq: Once | INTRAVENOUS | Status: AC
  Administered 2016-07-05: 600 mg via INTRAVENOUS
  Filled 2016-07-05: qty 50

## 2016-07-05 MED ORDER — SODIUM CHLORIDE 0.9 % IV SOLN
INTRAVENOUS | Status: DC
  Administered 2016-07-05: 18:00:00 via INTRAVENOUS

## 2016-07-05 MED ORDER — PNEUMOCOCCAL VAC POLYVALENT 25 MCG/0.5ML IJ INJ
0.5000 mL | INJECTION | INTRAMUSCULAR | Status: AC
  Administered 2016-07-06: 0.5 mL via INTRAMUSCULAR
  Filled 2016-07-05: qty 0.5

## 2016-07-05 MED ORDER — INFLUENZA VAC SPLIT QUAD 0.5 ML IM SUSY
0.5000 mL | PREFILLED_SYRINGE | INTRAMUSCULAR | Status: AC
  Administered 2016-07-06: 0.5 mL via INTRAMUSCULAR
  Filled 2016-07-05: qty 0.5

## 2016-07-05 MED ORDER — SODIUM CHLORIDE 0.9 % IV BOLUS (SEPSIS)
1000.0000 mL | Freq: Once | INTRAVENOUS | Status: AC
  Administered 2016-07-05: 1000 mL via INTRAVENOUS

## 2016-07-05 NOTE — Progress Notes (Signed)
Arrived via carelink.  No active distress.  Notified service of patients arrival and MD will come see patient.  Oriented to room and routine.  Vitals stable.

## 2016-07-05 NOTE — Progress Notes (Signed)
Patient ID: Kevin Guzman, male   DOB: 1951/09/15, 65 y.o.   MRN: [id number] Observation /MedSurg bed for cellulitis treatment with IV antibiotics.  Please call the floor manager at extension [phone] admitting physician assignment as soon as the patient arrives to the floor.  65 year old male with a past medical history of asthma, CAD, non-STEMI in 2008, hyperlipidemia, hypertension, testosterone deficiency who presented [facility] emergency department with complaints of right knee pain associated with lower extremity  erythema, edema suspicious for cellulitis. No fever, no nausea or emesis. Per Dr. Eudelia Bunchardama, clinical picture has a low suspicions for septic joint. WBC 11.3 with left shift, glucose 10 8 mg/dL, set rate 68 and CRP still pending. Received IV clindamycin and a normal saline 1 L bolus.  [name], M.D. [phone].

## 2016-07-05 NOTE — ED Triage Notes (Signed)
Pt in c/o R leg swelling, pain and erythema progressing from knee to ankle. Pt states he generally doesn't feel well and is fatigued. Pt alert, interactive, in NAD.

## 2016-07-05 NOTE — ED Provider Notes (Signed)
[facility] Provider Note   CSN: [id number] Arrival date & time: 07/05/16  1501  By signing my name below, I, Kevin Guzman, attest that this documentation has been prepared under the direction and in the presence of [name], MD.  Electronically Signed: [name], ED Scribe. 07/05/16. 4:09 PM.    History   Chief Complaint Chief Complaint  Patient presents with  . Leg Swelling    The history is provided by the patient. No language interpreter was used.    HPI Comments: Kevin Guzman is a 65 y.o. male who has a PMhx of CAD, NSTEMI, HTN, HLD and asthma presents to the Emergency Department complaining of sudden onset, gradual worsening, right knee pain that radiates into his right leg onset a few days ago. Pt has associated swelling and redness to his right lower extremity. Pt plays in a band and he just recently returned from a 5 hour car ride about 10 days ago. Pt has a hx of gout but notes his pain is in his toes. He denies fever, nausea, vomiting, chest pain, HA, dizziness, weakness, constipation, diarrhea, abdominal pain, shortness of breath, hx of DVT in past, hormonal replacement therapies, recent surgeries in past 4 weeks or trauma to the leg.  Past Medical History:  Diagnosis Date  . Asthma   . CAD (coronary artery disease)    a. 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 EF 65% at cath 8/08  . HLD (hyperlipidemia)   . HTN (hypertension)   . Testosterone deficiency     Patient Active Problem List   Diagnosis Date Noted  . Cellulitis 07/05/2016  . Asthma exacerbation 01/13/2016  . Acute respiratory failure with hypoxia (HCC) 01/13/2016  . Hypokalemia 01/13/2016  . Viral URI 01/13/2016  . Allergic rhinitis 01/14/2013  . Rash and nonspecific skin eruption 03/01/2012  . Scabies 01/20/2012  . Eczema 01/20/2012  . Coronary atherosclerosis of native coronary artery 08/03/2009  . MASS, CHEST WALL 07/27/2009  .  Hyperlipemia 07/24/2008  . Essential hypertension 07/24/2008  . URI 07/24/2008  . Asthma, intermittent with acute exacerbation 07/24/2008    Past Surgical History:  Procedure Laterality Date  . CARDIAC SURGERY    . HERNIA REPAIR         Home Medications    Prior to Admission medications   Medication Sig Start Date End Date Taking? Authorizing Provider  albuterol (PROVENTIL HFA;VENTOLIN HFA) 108 (90 BASE) MCG/ACT inhaler Inhale 2 puffs into the lungs every 4 (four) hours as needed for wheezing or shortness of breath. 12/30/14   Shon Baton, MD  albuterol (PROVENTIL) (2.5 MG/3ML) 0.083% nebulizer solution Take 2.5 mg by nebulization every 6 (six) hours as needed for wheezing or shortness of breath.    Historical Provider, MD  aspirin 81 MG EC tablet Take 81 mg by mouth daily.      Historical Provider, MD  atorvastatin (LIPITOR) 80 MG tablet TAKE 1 TABLET (80 MG TOTAL) BY MOUTH DAILY AT 6 PM. 01/11/16   Tonny Bollman, MD  beclomethasone (QVAR) 80 MCG/ACT inhaler Inhale 1 puff into the lungs 2 (two) times daily. 03/21/16   Historical Provider, MD  colchicine 0.6 MG tablet Take 0.6 mg by mouth daily as needed (FOR GOUT FLARE UP).    Historical Provider, MD  hydrochlorothiazide (MICROZIDE) 12.5 MG capsule TAKE 1 CAPSULE (12.5 MG TOTAL) BY MOUTH DAILY. 01/18/16   Tonny Bollman, MD  Melatonin 5 MG CAPS Take 5  mg by mouth daily as needed (sleep).     Historical Provider, MD  metoprolol tartrate (LOPRESSOR) 25 MG tablet TAKE ONE TABLET   BY MOUTH   EVERY 12 HOURS 03/25/16   Tonny BollmanMichael Cooper, MD    Family History Family History  Problem Relation Age of Onset  . Cancer Mother   . Heart attack Neg Hx   . Stroke Neg Hx     Social History Social History  Substance Use Topics  . Smoking status: Former Games developermoker  . Smokeless tobacco: Former NeurosurgeonUser    Quit date: 10/13/2001  . Alcohol use No     Allergies   Almond meal; Almond oil; and Ace inhibitors   Review of Systems Review of Systems A  complete 10 system review of systems was obtained and all systems are negative except as noted in the HPI and PMH.    Physical Exam Updated Vital Signs BP 116/56 (BP Location: Left Arm)   Pulse 81   Temp 98.2 F (36.8 C) (Oral)   Resp 20   Ht 5\' 4"  (1.626 m)   Wt 190 lb (86.2 kg)   SpO2 96%   BMI 32.61 kg/m   Physical Exam  Constitutional: He is oriented to person, place, and time. He appears well-developed and well-nourished. No distress.  HENT:  Head: Normocephalic and atraumatic.  Nose: Nose normal.  Eyes: Conjunctivae and EOM are normal. Pupils are equal, round, and reactive to light. Right eye exhibits no discharge. Left eye exhibits no discharge. No scleral icterus.  Neck: Normal range of motion. Neck supple.  Cardiovascular: Normal rate and regular rhythm.  Exam reveals no gallop and no friction rub.   No murmur heard. Pulmonary/Chest: Effort normal and breath sounds normal. No stridor. No respiratory distress. He has no rales.  Abdominal: Soft. He exhibits no distension. There is no tenderness.  Musculoskeletal: Normal range of motion. He exhibits edema and tenderness.  Right knee with mild tenderness, FROM without pain, no notable effusion. Right leg swelling compared to contralateral side.  Neurological: He is alert and oriented to person, place, and time.  Skin: Skin is warm and dry. No rash noted. He is not diaphoretic. There is erythema.  Erythema of anterior aspect of right leg from top of knee to just above than ankle. Non TTP, blanchable, non circumferential   Psychiatric: He has a normal mood and affect.  Vitals reviewed.    ED Treatments / Results  DIAGNOSTIC STUDIES: Oxygen Saturation is 96% on RA, adequate by my interpretation.  COORDINATION OF CARE:  4:03 PM Discussed treatment plan with pt at bedside and pt agreed to plan.  Labs (all labs ordered are listed, but only abnormal results are displayed) Labs Reviewed  CBC WITH DIFFERENTIAL/PLATELET -  Abnormal; Notable for the following:       Result Value   WBC 11.3 (*)    Hemoglobin 12.9 (*)    HCT 38.5 (*)    Neutro Abs 8.8 (*)    All other components within normal limits  BASIC METABOLIC PANEL - Abnormal; Notable for the following:    Chloride 100 (*)    Glucose, Bld 108 (*)    Calcium 8.8 (*)    All other components within normal limits  SEDIMENTATION RATE - Abnormal; Notable for the following:    Sed Rate 68 (*)    All other components within normal limits  C-REACTIVE PROTEIN    EKG  EKG Interpretation None       Radiology [place]  Venous Img Lower Unilateral Right  Result Date: 07/05/2016 CLINICAL DATA:  Right lower extremity pain and edema EXAM: RIGHT LOWER EXTREMITY VENOUS DUPLEX ULTRASOUND TECHNIQUE: Gray-scale sonography with graded compression, as well as color Doppler and duplex ultrasound were performed to evaluate the right lower extremity deep venous system from the level of the common femoral vein and including the common femoral, femoral, profunda femoral, popliteal and calf veins including the posterior tibial, peroneal and gastrocnemius veins when visible. The superficial great saphenous vein was also interrogated. Spectral Doppler was utilized to evaluate flow at rest and with distal augmentation maneuvers in the common femoral, femoral and popliteal veins. COMPARISON:  None. FINDINGS: Contralateral Common Femoral Vein: Respiratory phasicity is normal and symmetric with the symptomatic side. No evidence of thrombus. Normal compressibility. Common Femoral Vein: No evidence of thrombus. Normal compressibility, respiratory phasicity and response to augmentation. Saphenofemoral Junction: No evidence of thrombus. Normal compressibility and flow on color Doppler imaging. Profunda Femoral Vein: No evidence of thrombus. Normal compressibility and flow on color Doppler imaging. Femoral Vein: No evidence of thrombus. Normal compressibility, respiratory phasicity and response to  augmentation. Popliteal Vein: No evidence of thrombus. Normal compressibility, respiratory phasicity and response to augmentation. Calf Veins: No evidence of thrombus. Normal compressibility and flow on color Doppler imaging. Superficial Great Saphenous Vein: No evidence of thrombus. Normal compressibility and flow on color Doppler imaging. Venous Reflux:  None. Other Findings: There is soft tissue edema in the right calf region. IMPRESSION: No evidence of right lower extremity deep venous thrombosis. Left common femoral vein also patent. There is soft tissue edema in the right calf region. Electronically Signed   By: Bretta Bang III M.D.   On: 07/05/2016 17:06    Procedures Procedures (including critical care time)  Medications Ordered in ED Medications  sodium chloride 0.9 % bolus 1,000 mL (0 mLs Intravenous Stopped 07/05/16 1809)    And  0.9 %  sodium chloride infusion ( Intravenous New Bag/Given 07/05/16 1808)  clindamycin (CLEOCIN) IVPB 600 mg (600 mg Intravenous New Bag/Given 07/05/16 1809)     Initial Impression / Assessment and Plan / ED Course  I have reviewed the triage vital signs and the nursing notes.  Pertinent labs & imaging results that were available during my care of the patient were reviewed by me and considered in my medical decision making (see chart for details).  Clinical Course    Workup consistent with cellulitis. Ultrasound negative for DVT. Low suspicion for septic arthritis of the right knee. Given the surface area of the cellulitis, patient will require admission for IV antibiotics.  Appreciate hospitalist for admission.  I personally performed the services described in this documentation, which was scribed in my presence. The recorded information has been reviewed and is accurate.    Final Clinical Impressions(s) / ED Diagnoses   Final diagnoses:  Leg swelling  Cellulitis of right lower extremity    Disposition: Admit  Condition: stable       [name], MD 07/05/16 [phone]

## 2016-07-06 ENCOUNTER — Inpatient Hospital Stay (HOSPITAL_COMMUNITY): Payer: 59

## 2016-07-06 ENCOUNTER — Encounter (HOSPITAL_COMMUNITY): Payer: Self-pay | Admitting: Internal Medicine

## 2016-07-06 DIAGNOSIS — L03115 Cellulitis of right lower limb: Secondary | ICD-10-CM | POA: Diagnosis not present

## 2016-07-06 DIAGNOSIS — J45909 Unspecified asthma, uncomplicated: Secondary | ICD-10-CM | POA: Diagnosis present

## 2016-07-06 LAB — COMPREHENSIVE METABOLIC PANEL
ALT: 16 U/L — ABNORMAL LOW (ref 17–63)
AST: 17 U/L (ref 15–41)
Albumin: 3.1 g/dL — ABNORMAL LOW (ref 3.5–5.0)
Alkaline Phosphatase: 70 U/L (ref 38–126)
Anion gap: 9 (ref 5–15)
BUN: 12 mg/dL (ref 6–20)
CO2: 27 mmol/L (ref 22–32)
Calcium: 8.1 mg/dL — ABNORMAL LOW (ref 8.9–10.3)
Chloride: 103 mmol/L (ref 101–111)
Creatinine, Ser: 0.99 mg/dL (ref 0.61–1.24)
GFR calc Af Amer: >60 mL/min (ref >60)
GFR calc non Af Amer: >60 mL/min (ref >60)
Glucose, Bld: 98 mg/dL (ref 65–99)
Potassium: 3.5 mmol/L (ref 3.5–5.1)
Sodium: 139 mmol/L (ref 135–145)
Total Bilirubin: 0.7 mg/dL (ref 0.3–1.2)
Total Protein: 6.1 g/dL — ABNORMAL LOW (ref 6.5–8.1)

## 2016-07-06 LAB — CBC WITH DIFFERENTIAL/PLATELET
Basophils Absolute: 0 10*3/uL (ref 0.0–0.1)
Basophils Relative: 0 %
Eosinophils Absolute: 0.1 10*3/uL (ref 0.0–0.7)
Eosinophils Relative: 1 %
HCT: 36.2 % — ABNORMAL LOW (ref 39.0–52.0)
Hemoglobin: 11.8 g/dL — ABNORMAL LOW (ref 13.0–17.0)
Lymphocytes Relative: 17 %
Lymphs Abs: 1.6 10*3/uL (ref 0.7–4.0)
MCH: 29.1 pg (ref 26.0–34.0)
MCHC: 32.6 g/dL (ref 30.0–36.0)
MCV: 89.2 fL (ref 78.0–100.0)
Monocytes Absolute: 1 10*3/uL (ref 0.1–1.0)
Monocytes Relative: 10 %
Neutro Abs: 7.1 10*3/uL (ref 1.7–7.7)
Neutrophils Relative %: 72 %
Platelets: 225 10*3/uL (ref 150–400)
RBC: 4.06 MIL/uL — ABNORMAL LOW (ref 4.22–5.81)
RDW: 13.1 % (ref 11.5–15.5)
WBC: 9.8 10*3/uL (ref 4.0–10.5)

## 2016-07-06 MED ORDER — METOPROLOL TARTRATE 25 MG PO TABS
25.0000 mg | ORAL_TABLET | Freq: Every day | ORAL | Status: DC
  Administered 2016-07-06 – 2016-07-09 (×4): 25 mg via ORAL
  Filled 2016-07-06 (×5): qty 1

## 2016-07-06 MED ORDER — MELATONIN 3 MG PO TABS
4.5000 mg | ORAL_TABLET | Freq: Every evening | ORAL | Status: DC | PRN
Start: 2016-07-06 — End: 2016-07-09
  Administered 2016-07-08: 4.5 mg via ORAL
  Filled 2016-07-06 (×3): qty 1.5

## 2016-07-06 MED ORDER — ACETAMINOPHEN 325 MG PO TABS
650.0000 mg | ORAL_TABLET | Freq: Four times a day (QID) | ORAL | Status: DC | PRN
  Administered 2016-07-06 – 2016-07-08 (×2): 650 mg via ORAL
  Filled 2016-07-06 (×2): qty 2

## 2016-07-06 MED ORDER — HYDROCHLOROTHIAZIDE 12.5 MG PO CAPS
12.5000 mg | ORAL_CAPSULE | Freq: Every day | ORAL | Status: DC
  Administered 2016-07-06 – 2016-07-09 (×4): 12.5 mg via ORAL
  Filled 2016-07-06 (×4): qty 1

## 2016-07-06 MED ORDER — ONDANSETRON HCL 4 MG/2ML IJ SOLN: 4.0000 mg | Freq: Four times a day (QID) | INTRAMUSCULAR | Status: DC | PRN

## 2016-07-06 MED ORDER — ATORVASTATIN CALCIUM 80 MG PO TABS
80.0000 mg | ORAL_TABLET | Freq: Every day | ORAL | Status: DC
  Administered 2016-07-06 – 2016-07-08 (×3): 80 mg via ORAL
  Filled 2016-07-06 (×3): qty 1

## 2016-07-06 MED ORDER — ALBUTEROL SULFATE (2.5 MG/3ML) 0.083% IN NEBU: 2.5000 mg | INHALATION_SOLUTION | Freq: Four times a day (QID) | RESPIRATORY_TRACT | Status: DC | PRN

## 2016-07-06 MED ORDER — METOPROLOL TARTRATE 25 MG PO TABS: 25.0000 mg | ORAL_TABLET | Freq: Two times a day (BID) | ORAL | Status: DC

## 2016-07-06 MED ORDER — SODIUM CHLORIDE 0.9 % IV SOLN
INTRAVENOUS | Status: DC
  Administered 2016-07-06 (×2): via INTRAVENOUS

## 2016-07-06 MED ORDER — BUDESONIDE 0.5 MG/2ML IN SUSP
1.0000 mg | Freq: Two times a day (BID) | RESPIRATORY_TRACT | Status: DC
  Administered 2016-07-06 (×2): 1 mg via RESPIRATORY_TRACT
  Filled 2016-07-06 (×2): qty 4

## 2016-07-06 MED ORDER — CLINDAMYCIN PHOSPHATE 900 MG/50ML IV SOLN
900.0000 mg | Freq: Four times a day (QID) | INTRAVENOUS | Status: DC
  Administered 2016-07-06 – 2016-07-09 (×14): 900 mg via INTRAVENOUS
  Filled 2016-07-06 (×16): qty 50

## 2016-07-06 MED ORDER — SODIUM CHLORIDE 0.9% FLUSH
3.0000 mL | Freq: Two times a day (BID) | INTRAVENOUS | Status: DC
  Administered 2016-07-06 – 2016-07-08 (×5): 3 mL via INTRAVENOUS

## 2016-07-06 MED ORDER — KETOROLAC TROMETHAMINE 30 MG/ML IJ SOLN: 30.0000 mg | Freq: Four times a day (QID) | INTRAMUSCULAR | Status: AC | PRN

## 2016-07-06 MED ORDER — COLCHICINE 0.6 MG PO TABS
0.6000 mg | ORAL_TABLET | Freq: Every day | ORAL | Status: DC | PRN
  Administered 2016-07-08 – 2016-07-09 (×2): 0.6 mg via ORAL
  Filled 2016-07-06 (×2): qty 1

## 2016-07-06 MED ORDER — ENOXAPARIN SODIUM 40 MG/0.4ML ~~LOC~~ SOLN
40.0000 mg | SUBCUTANEOUS | Status: DC
  Filled 2016-07-06: qty 0.4

## 2016-07-06 MED ORDER — ASPIRIN EC 81 MG PO TBEC
81.0000 mg | DELAYED_RELEASE_TABLET | Freq: Every day | ORAL | Status: DC
  Administered 2016-07-06 – 2016-07-09 (×4): 81 mg via ORAL
  Filled 2016-07-06 (×4): qty 1

## 2016-07-06 MED ORDER — ONDANSETRON HCL 4 MG PO TABS: 4.0000 mg | ORAL_TABLET | Freq: Four times a day (QID) | ORAL | Status: DC | PRN

## 2016-07-06 MED ORDER — OXYCODONE HCL 5 MG PO TABS
5.0000 mg | ORAL_TABLET | ORAL | Status: DC | PRN
  Administered 2016-07-07 – 2016-07-08 (×7): 5 mg via ORAL
  Filled 2016-07-06 (×7): qty 1

## 2016-07-06 NOTE — H&P (Signed)
History and Physical    Robbi Scurlock ZOX:[id number] DOB: Nov 23, 1950 DOA: 07/05/2016  PCP: Verlon Au, MD   Patient coming from: [facility].  Chief Complaint: Right leg infection.  HPI: Kevin Guzman is a 65 y.o. male with medical history significant of asthma, CAD, non-STEMI/CABG in 2008, hyperlipidemia, hypertension, testosterone deficiency coming from [facility] for treatment of RLE cellulitis.  Per patient, last week he fell sustaining minor trauma to his right knee while trying to separate 2 dogs that were fighting. He had transient pain, but did not have any further discomfort and had forgotten about this incident until I asked and he was reminded by his wife. He states that he woke up on Thursday morning feeling pain on his knee around 0545, but continue with his normal routine without significant disruption. He states that on Friday morning he woke up and he is right knee area was "swollen and red" which has persisted through today and has now extended to his thigh and pretibial area, so he decided to go to the emergency department. He denies fever, night sweats, but complains of chills and tenderness in the area.   ED Course: The patient was started on IV clindamycin and was given analgesics. Workup showed elevated sedimentation rate of 68, CRP 23.5, mild anemia and leukocytosis.  Review of Systems: As per HPI otherwise 10 point review of systems negative.    Past Medical History:  Diagnosis Date  . Asthma   . CAD (coronary artery disease)    a. 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 EF 65% at cath 8/08  . HLD (hyperlipidemia)   . HTN (hypertension)   . Testosterone deficiency     Past Surgical History:  Procedure Laterality Date  . CARDIAC SURGERY    . HERNIA REPAIR       reports that he has quit smoking. He quit smokeless tobacco use about 17 years ago. He reports that he does not drink alcohol or use drugs.  Allergies  Allergen  Reactions  . Almond Meal Anaphylaxis  . Almond Oil Swelling  . Ace Inhibitors     coughing    Family History  Problem Relation Age of Onset  . Cancer Mother   . Heart attack Neg Hx   . Stroke Neg Hx     Prior to Admission medications   Medication Sig Start Date End Date Taking? Authorizing Provider  albuterol (PROVENTIL HFA;VENTOLIN HFA) 108 (90 BASE) MCG/ACT inhaler Inhale 2 puffs into the lungs every 4 (four) hours as needed for wheezing or shortness of breath. 12/30/14   Shon Baton, MD  albuterol (PROVENTIL) (2.5 MG/3ML) 0.083% nebulizer solution Take 2.5 mg by nebulization every 6 (six) hours as needed for wheezing or shortness of breath.    Historical Provider, MD  aspirin 81 MG EC tablet Take 81 mg by mouth daily.      Historical Provider, MD  atorvastatin (LIPITOR) 80 MG tablet TAKE 1 TABLET (80 MG TOTAL) BY MOUTH DAILY AT 6 PM. 01/11/16   Tonny Bollman, MD  beclomethasone (QVAR) 80 MCG/ACT inhaler Inhale 1 puff into the lungs 2 (two) times daily. 03/21/16   Historical Provider, MD  colchicine 0.6 MG tablet Take 0.6 mg by mouth daily as needed (FOR GOUT FLARE UP).    Historical Provider, MD  hydrochlorothiazide (MICROZIDE) 12.5 MG capsule TAKE 1 CAPSULE (12.5 MG TOTAL) BY MOUTH DAILY. 01/18/16   Tonny Bollman, MD  Melatonin 5 MG CAPS Take  5 mg by mouth daily as needed (sleep).     Historical Provider, MD  metoprolol tartrate (LOPRESSOR) 25 MG tablet TAKE ONE TABLET   BY MOUTH   EVERY 12 HOURS 03/25/16   Tonny BollmanMichael Cooper, MD    Physical Exam: Vitals:   07/05/16 1855 07/05/16 1855 07/05/16 2016 07/05/16 2102  BP: 133/74 133/74 128/68 105/85  Pulse: 66 65 72 79  Resp:  18 19 18   Temp:   98.7 F (37.1 C) 98.8 F (37.1 C)  TempSrc:   Oral Oral  SpO2: 95% 97% 98% 98%  Weight:    86.1 kg (189 lb 13.1 oz)  Height:    5\' 4"  (1.626 m)      Constitutional: NAD, calm, comfortable Vitals:   07/05/16 1855 07/05/16 1855 07/05/16 2016 07/05/16 2102  BP: 133/74 133/74 128/68  105/85  Pulse: 66 65 72 79  Resp:  18 19 18   Temp:   98.7 F (37.1 C) 98.8 F (37.1 C)  TempSrc:   Oral Oral  SpO2: 95% 97% 98% 98%  Weight:    86.1 kg (189 lb 13.1 oz)  Height:    5\' 4"  (1.626 m)   Eyes: PERRL, lids and conjunctivae normal ENMT: Mucous membranes are moist. Posterior pharynx clear of any exudate or lesions. Neck: normal, supple, no masses, no thyromegaly Respiratory: clear to auscultation bilaterally, no wheezing, no crackles. Normal respiratory effort. No accessory muscle use.  Cardiovascular: Regular rate and rhythm, no murmurs / rubs / gallops. No extremity edema. 2+ pedal pulses. No carotid bruits.  Abdomen: no tenderness, no masses palpated. No hepatosplenomegaly. Bowel sounds positive.  Musculoskeletal: no clubbing / cyanosis. Positive tenderness of prepatellar area. Passive full ROM of right knee without any significant discomfort, no contractures. Normal muscle tone.  Skin: Positive erythema, calor, tenderness and edema of the knee area extending to his lower thigh and pretibial areas. Neurologic: CN 2-12 grossly intact. Sensation intact, DTR normal. Strength 5/5 in all 4.  Psychiatric: Normal judgment and insight. Alert and oriented x 3. Normal mood.    Labs on Admission: I have personally reviewed following labs and imaging studies  CBC:  Recent Labs Lab 07/05/16 1620  WBC 11.3*  NEUTROABS 8.8*  HGB 12.9*  HCT 38.5*  MCV 88.1  PLT 228   Basic Metabolic Panel:  Recent Labs Lab 07/05/16 1620  NA 137  K 3.8  CL 100*  CO2 29  GLUCOSE 108*  BUN 20  CREATININE 1.04  CALCIUM 8.8*   GFR: Estimated Creatinine Clearance: 72 mL/min (by C-G formula based on SCr of 1.04 mg/dL). Liver Function Tests: No results for input(s): AST, ALT, ALKPHOS, BILITOT, PROT, ALBUMIN in the last 168 hours. No results for input(s): LIPASE, AMYLASE in the last 168 hours. No results for input(s): AMMONIA in the last 168 hours. Coagulation Profile: No results for  input(s): INR, PROTIME in the last 168 hours. Cardiac Enzymes: No results for input(s): CKTOTAL, CKMB, CKMBINDEX, TROPONINI in the last 168 hours. BNP (last 3 results) No results for input(s): PROBNP in the last 8760 hours. HbA1C: No results for input(s): HGBA1C in the last 72 hours. CBG: No results for input(s): GLUCAP in the last 168 hours. Lipid Profile: No results for input(s): CHOL, HDL, LDLCALC, TRIG, CHOLHDL, LDLDIRECT in the last 72 hours. Thyroid Function Tests: No results for input(s): TSH, T4TOTAL, FREET4, T3FREE, THYROIDAB in the last 72 hours. Anemia Panel: No results for input(s): VITAMINB12, FOLATE, FERRITIN, TIBC, IRON, RETICCTPCT in the last 72 hours. Urine  analysis:    Component Value Date/Time   COLORURINE YELLOW 05/25/2007 1704   APPEARANCEUR CLEAR 05/25/2007 1704   LABSPEC 1.005 05/25/2007 1704   PHURINE 6.5 05/25/2007 1704   GLUCOSEU NEGATIVE 05/25/2007 1704   HGBUR NEGATIVE 05/25/2007 1704   BILIRUBINUR NEGATIVE 05/25/2007 1704   KETONESUR NEGATIVE 05/25/2007 1704   PROTEINUR NEGATIVE 05/25/2007 1704   UROBILINOGEN 0.2 05/25/2007 1704   NITRITE NEGATIVE 05/25/2007 1704   LEUKOCYTESUR  05/25/2007 1704    NEGATIVE MICROSCOPIC NOT DONE ON URINES WITH NEGATIVE PROTEIN, BLOOD, LEUKOCYTES, NITRITE, OR GLUCOSE <1000 mg/dL.    Radiological Exams on Admission: US Venous Img Lower Unilateral Right  Result Date: 07/05/2016 CLINICAL DATA:  Right lower extremity pain and edema EXAM: RIGHT LOWER EXTREMITY VENOUS DUPLEX ULTRASOUND TECHNIQUE: Gray-scale sonography with graded compression, as well as color Doppler and duplex ultrasound were performed to evaluate the right lower extremity deep venous system from the level of the common femoral vein and including the common femoral, femoral, profunda femoral, popliteal and calf veins including the posterior tibial, peroneal and gastrocnemius veins when visible. The superficial great saphenous vein was also interrogated.  Spectral Doppler was utilized to evaluate flow at rest and with distal augmentation maneuvers in the common femoral, femoral and popliteal veins. COMPARISON:  None. FINDINGS: Contralateral Common Femoral Vein: Respiratory phasicity is normal and symmetric with the symptomatic side. No evidence of thrombus. Normal compressibility. Common Femoral Vein: No evidence of thrombus. Normal compressibility, respiratory phasicity and response to augmentation. Saphenofemoral Junction: No evidence of thrombus. Normal compressibility and flow on color Doppler imaging. Profunda Femoral Vein: No evidence of thrombus. Normal compressibility and flow on color Doppler imaging. Femoral Vein: No evidence of thrombus. Normal compressibility, respiratory phasicity and response to augmentation. Popliteal Vein: No evidence of thrombus. Normal compressibility, respiratory phasicity and response to augmentation. Calf Veins: No evidence of thrombus. Normal compressibility and flow on color Doppler imaging. Superficial Great Saphenous Vein: No evidence of thrombus. Normal compressibility and flow on color Doppler imaging. Venous Reflux:  None. Other Findings: There is soft tissue edema in the right calf region. IMPRESSION: No evidence of right lower extremity deep venous thrombosis. Left common femoral vein also patent. There is soft tissue edema in the right calf region. Electronically Signed   By: Bretta Bang III M.D.   On: 07/05/2016 17:06     Assessment/Plan Principal Problem:   Cellulitis Admit to MedSurg/inpatient. Continue analgesics as needed. Continue clindamycin 900 mg IVP every 6 hours. Follow-up CBC and CMP in a.m. Check right knee x-rayed in the morning.  Active Problems:   Hyperlipemia Continue atorvastatin 80 mg by mouth daily. Check hepatic function panel periodically.    Essential hypertension Hydrochlorothiazide 12.5 mg by mouth daily Metoprolol 25 mg by mouth daily. Blood pressure monitoring.     Coronary atherosclerosis of native coronary artery Continue aspirin, atorvastatin and metoprolol.    Asthma Bronchodilators and supplemental oxygen as needed.   DVT prophylaxis: Lovenox SQ.  Code Status: Full code.  Family Communication: Wife was present in the room.  Disposition Plan: Made for IV antibiotic therapy for several days.  Consults called:  Admission status: Inpatient/MedSurg  Bobette Mo MD [facility] Pager [phone].  If 7PM-7AM, please contact night-coverage [email or web address] Password [facility]  07/06/2016, 12:50 AM

## 2016-07-06 NOTE — Progress Notes (Signed)
[facility]  Interval progress note   Brief Summary:   Kevin Guzman is a 65 y.o. male with medical history significant of asthma, CAD, non-STEMI/CABG in 2008, hyperlipidemia, hypertension, testosterone deficiency admitted for treatment of RLE cellulitis On IV clindamycin,   Patient seen and examined this morning at bedside. Patient had significantly improved overnight, redness and erythema almost gone. Some significant swelling remains on his right lower extremity. Vital signs stable no other events overnight.  Continue to follow. Continue management as per H&P  Latrelle DodrillEdwin Silva, MD

## 2016-07-07 DIAGNOSIS — I10 Essential (primary) hypertension: Secondary | ICD-10-CM | POA: Diagnosis not present

## 2016-07-07 DIAGNOSIS — L03115 Cellulitis of right lower limb: Secondary | ICD-10-CM | POA: Diagnosis not present

## 2016-07-07 LAB — CBC WITH DIFFERENTIAL/PLATELET
Basophils Absolute: 0 10*3/uL (ref 0.0–0.1)
Basophils Relative: 0 %
Eosinophils Absolute: 0 10*3/uL (ref 0.0–0.7)
Eosinophils Relative: 0 %
HCT: 35.7 % — ABNORMAL LOW (ref 39.0–52.0)
Hemoglobin: 11.8 g/dL — ABNORMAL LOW (ref 13.0–17.0)
Lymphocytes Relative: 15 %
Lymphs Abs: 1.5 10*3/uL (ref 0.7–4.0)
MCH: 28.6 pg (ref 26.0–34.0)
MCHC: 33.1 g/dL (ref 30.0–36.0)
MCV: 86.4 fL (ref 78.0–100.0)
Monocytes Absolute: 0.7 10*3/uL (ref 0.1–1.0)
Monocytes Relative: 7 %
Neutro Abs: 7.6 10*3/uL (ref 1.7–7.7)
Neutrophils Relative %: 78 %
Platelets: 242 10*3/uL (ref 150–400)
RBC: 4.13 MIL/uL — ABNORMAL LOW (ref 4.22–5.81)
RDW: 12.9 % (ref 11.5–15.5)
WBC: 9.8 10*3/uL (ref 4.0–10.5)

## 2016-07-07 NOTE — Progress Notes (Signed)
PROGRESS NOTE [facility]   TushkaNicholas Guzman   WUJ:[id number] DOB: August 16, 1951  DOA: 07/05/2016 PCP: [name], [name], MD   Brief Narrative:  Kevin MondayNicholas Savoyskiis a 65 y.o.malewith medical history significant of asthma, CAD, non-STEMI/CABG in 2008, hyperlipidemia, hypertension, testosterone deficiency admitted for treatment of RLE cellulitis on IV clindamycin, On day 1 of hospital stay spiked fevers tmax 101.5, blood cultures pending.   Subjective:  Patient seen and examined at bedside this morning. Patient report feeling malaise, and having chills also with lack of appetite. Patient reported erythema and swelling have improved. No other complaints.  Assessment & Plan:   Cellulitis R LE from the knee below down to the ankle. Responding to IV clinda. Now with fevers episodes.  - follow up blood cultures  - continue abx therapy - if patient clinically improved will d/c tomorrow with oral abx - Pain medications PRN   Essential hypertension - stable - Continue home medications - Monitor BP  Hyperlipemia - stable - Continue home medication  Asthma - stable no acute exacerbations - Continue home treatment  DVT prophylaxis: Lovenox SQ.  Code Status: Full code.  Family Communication: None at bedside. Plan discussed with patients in details. Disposition Plan: Anticipate discharge tomorrow if clinically stable   Consultants:   None  Procedures:   Doppler US   Antimicrobials:  Clinda 07/06/16   Objective: Vitals:   07/06/16 1755 07/06/16 1917 07/06/16 2119 07/07/16 0524  BP: (!) 127/55  (!) 104/53 116/62  Pulse: 94  86 72  Resp: 20  18 18   Temp: (!) 101.5 F (38.6 C) (!) 100.7 F (38.2 C) 100.1 F (37.8 C) 99.7 F (37.6 C)  TempSrc: Oral Oral Oral Oral  SpO2: 96%  96% 94%  Weight:      Height:       No intake or output data in the 24 hours ending 07/07/16 1424 Filed Weights   07/05/16 1510 07/05/16 2102  Weight: 86.2 kg (190 lb) 86.1 kg (189 lb  13.1 oz)    Examination:  General exam: Appears calm and comfortable  Respiratory system: Clear to auscultation. No wheezes,crackle or rhonchi Cardiovascular system: S1 & S2 heard, RRR. No JVD, murmurs, rubs or gallops Gastrointestinal system: Abdomen is nondistended, soft and nontender.  Central nervous system: Alert and oriented. No focal neurological deficits. Extremities: No pedal edema. Symmetric, strength 5/5   Skin: erythema, calor, tenderness and edema of the knee area extending to his lower thigh and pretibial areas. Improving Psychiatry: Judgement and insight appear normal. Mood & affect appropriate.    Data Reviewed: I have personally reviewed following labs and imaging studies  CBC:  Recent Labs Lab 07/05/16 1620 07/06/16 0313 07/07/16 0502  WBC 11.3* 9.8 9.8  NEUTROABS 8.8* 7.1 7.6  HGB 12.9* 11.8* 11.8*  HCT 38.5* 36.2* 35.7*  MCV 88.1 89.2 86.4  PLT 228 225 242   Basic Metabolic Panel:  Recent Labs Lab 07/05/16 1620 07/06/16 0313  NA 137 139  K 3.8 3.5  CL 100* 103  CO2 29 27  GLUCOSE 108* 98  BUN 20 12  CREATININE 1.04 0.99  CALCIUM 8.8* 8.1*   GFR: Estimated Creatinine Clearance: 75.6 mL/min (by C-G formula based on SCr of 0.99 mg/dL). Liver Function Tests:  Recent Labs Lab 07/06/16 0313  AST 17  ALT 16*  ALKPHOS 70  BILITOT 0.7  PROT 6.1*  ALBUMIN 3.1*   No results for input(s): LIPASE, AMYLASE in the last 168 hours. No results for input(s): AMMONIA in the  last 168 hours. Coagulation Profile: No results for input(s): INR, PROTIME in the last 168 hours. Cardiac Enzymes: No results for input(s): CKTOTAL, CKMB, CKMBINDEX, TROPONINI in the last 168 hours. BNP (last 3 results) No results for input(s): PROBNP in the last 8760 hours. HbA1C: No results for input(s): HGBA1C in the last 72 hours. CBG: No results for input(s): GLUCAP in the last 168 hours. Lipid Profile: No results for input(s): CHOL, HDL, LDLCALC, TRIG, CHOLHDL,  LDLDIRECT in the last 72 hours. Thyroid Function Tests: No results for input(s): TSH, T4TOTAL, FREET4, T3FREE, THYROIDAB in the last 72 hours. Anemia Panel: No results for input(s): VITAMINB12, FOLATE, FERRITIN, TIBC, IRON, RETICCTPCT in the last 72 hours. Sepsis Labs: No results for input(s): PROCALCITON, LATICACIDVEN in the last 168 hours.  No results found for this or any previous visit (from the past 240 hour(s)).    Radiology Studies: Dg Knee 1-2 Views Right  Result Date: 07/06/2016 CLINICAL DATA:  Pt fell on right knee 1 week ago. C/o pain on lateral side of right knee. Redness, warmth, and swelling are noticeable. EXAM: RIGHT KNEE - 1-2 VIEW COMPARISON:  None. FINDINGS: No evidence of fracture, dislocation, or joint effusion. No evidence of arthropathy or other focal bone abnormality. Soft tissues are unremarkable. IMPRESSION: Negative. Electronically Signed   By: Corlis Leak M.D.   On: 07/06/2016 13:37   US Venous Img Lower Unilateral Right  Result Date: 07/05/2016 CLINICAL DATA:  Right lower extremity pain and edema EXAM: RIGHT LOWER EXTREMITY VENOUS DUPLEX ULTRASOUND TECHNIQUE: Gray-scale sonography with graded compression, as well as color Doppler and duplex ultrasound were performed to evaluate the right lower extremity deep venous system from the level of the common femoral vein and including the common femoral, femoral, profunda femoral, popliteal and calf veins including the posterior tibial, peroneal and gastrocnemius veins when visible. The superficial great saphenous vein was also interrogated. Spectral Doppler was utilized to evaluate flow at rest and with distal augmentation maneuvers in the common femoral, femoral and popliteal veins. COMPARISON:  None. FINDINGS: Contralateral Common Femoral Vein: Respiratory phasicity is normal and symmetric with the symptomatic side. No evidence of thrombus. Normal compressibility. Common Femoral Vein: No evidence of thrombus. Normal  compressibility, respiratory phasicity and response to augmentation. Saphenofemoral Junction: No evidence of thrombus. Normal compressibility and flow on color Doppler imaging. Profunda Femoral Vein: No evidence of thrombus. Normal compressibility and flow on color Doppler imaging. Femoral Vein: No evidence of thrombus. Normal compressibility, respiratory phasicity and response to augmentation. Popliteal Vein: No evidence of thrombus. Normal compressibility, respiratory phasicity and response to augmentation. Calf Veins: No evidence of thrombus. Normal compressibility and flow on color Doppler imaging. Superficial Great Saphenous Vein: No evidence of thrombus. Normal compressibility and flow on color Doppler imaging. Venous Reflux:  None. Other Findings: There is soft tissue edema in the right calf region. IMPRESSION: No evidence of right lower extremity deep venous thrombosis. Left common femoral vein also patent. There is soft tissue edema in the right calf region. Electronically Signed   By: Bretta Bang III M.D.   On: 07/05/2016 17:06    Scheduled Meds: . aspirin EC  81 mg Oral Daily  . atorvastatin  80 mg Oral q1800  . budesonide  1 mg Nebulization BID  . clindamycin (CLEOCIN) IV  900 mg Intravenous Q6H  . enoxaparin (LOVENOX) injection  40 mg Subcutaneous Q24H  . hydrochlorothiazide  12.5 mg Oral Daily  . metoprolol tartrate  25 mg Oral Daily  . sodium chloride  flush  3 mL Intravenous Q12H   Continuous Infusions:    LOS: 2 days   Latrelle Dodrill, MD [facility] Pager [phone]  If 7PM-7AM, please contact night-coverage [email or web address] Password [facility] 07/07/2016, 2:24 PM

## 2016-07-08 ENCOUNTER — Observation Stay (HOSPITAL_COMMUNITY): Payer: 59

## 2016-07-08 DIAGNOSIS — L03115 Cellulitis of right lower limb: Secondary | ICD-10-CM | POA: Diagnosis not present

## 2016-07-08 DIAGNOSIS — I10 Essential (primary) hypertension: Secondary | ICD-10-CM | POA: Diagnosis not present

## 2016-07-08 DIAGNOSIS — E785 Hyperlipidemia, unspecified: Secondary | ICD-10-CM | POA: Diagnosis not present

## 2016-07-08 DIAGNOSIS — M10061 Idiopathic gout, right knee: Secondary | ICD-10-CM

## 2016-07-08 DIAGNOSIS — M109 Gout, unspecified: Secondary | ICD-10-CM | POA: Diagnosis present

## 2016-07-08 LAB — URIC ACID: Uric Acid, Serum: 7.8 mg/dL — ABNORMAL HIGH (ref 4.4–7.6)

## 2016-07-08 MED ORDER — ENOXAPARIN SODIUM 40 MG/0.4ML ~~LOC~~ SOLN
40.0000 mg | SUBCUTANEOUS | Status: DC
  Administered 2016-07-08: 40 mg via SUBCUTANEOUS
  Filled 2016-07-08: qty 0.4

## 2016-07-08 MED ORDER — MUSCLE RUB 10-15 % EX CREA
TOPICAL_CREAM | CUTANEOUS | Status: DC | PRN
  Administered 2016-07-08: 12:00:00 via TOPICAL
  Filled 2016-07-08 (×2): qty 85

## 2016-07-08 MED ORDER — PREDNISONE 20 MG PO TABS
40.0000 mg | ORAL_TABLET | Freq: Every day | ORAL | Status: DC
  Administered 2016-07-08 – 2016-07-09 (×2): 40 mg via ORAL
  Filled 2016-07-08 (×3): qty 2

## 2016-07-08 MED ORDER — BISACODYL 5 MG PO TBEC
5.0000 mg | DELAYED_RELEASE_TABLET | Freq: Two times a day (BID) | ORAL | Status: DC
  Administered 2016-07-08 – 2016-07-09 (×2): 5 mg via ORAL
  Filled 2016-07-08 (×2): qty 1

## 2016-07-08 NOTE — Progress Notes (Signed)
PROGRESS NOTE    Kyla Balzarineicholas Dawson  JXB:[id number] DOB: 09-29-1951 DOA: 07/05/2016 PCP: [name], [name], MD     Brief Narrative:  65 y.o. WM PMHx Asthma, CAD native artery, NSTEMI/CABG in 2008, HTN, HLD, Testosterone deficiency   Coming from [facility] for treatment of RLE cellulitis.  Per patient, last week he fell sustaining minor trauma to his right knee while trying to separate 2 dogs that were fighting. He had transient pain, but did not have any further discomfort and had forgotten about this incident until I asked and he was reminded by his wife. He states that he woke up on Thursday morning feeling pain on his knee around 0545, but continue with his normal routine without significant disruption. He states that on Friday morning he woke up and he is right knee area was "swollen and red" which has persisted through today and has now extended to his thigh and pretibial area, so he decided to go to the emergency department. He denies fever, night sweats, but complains of chills and tenderness in the area.   ED Course: The patient was started on IV clindamycin and was given analgesics. Workup showed elevated sedimentation rate of 68, CRP 23.5, mild anemia and leukocytosis.   Subjective: 9/26 A/O 4, acute right foot pain overnight severe enough to impact sleep. Rated as 8-9/10. Complains of some pain right knee also.     Assessment & Plan:   Principal Problem:   Cellulitis Active Problems:   Hyperlipemia   Essential hypertension   Coronary atherosclerosis of native coronary artery   Asthma   Cellulitis of right lower extremity   Acute gout   Cellulitis RLE from the knee below down to the ankle.  -Partial response to IV clindamycin. Symptoms not fully consistent with just a cellulitis. Most likely gout also playing a part in patient's pain. -9/26 Uric acid level= 7.8 - follow up blood cultures  - Change clindamycin to  PO in the Am if patient continues to improve  overnight. Could discharge on 9/27  PO antibiotics  - Pain medications PRN   Right foot pain acute -Swelling/pain rated at 8/10 overnight.  -Right foot x-ray negative for cause of pain.   Gout flare -See right foot pain -Continue Colchicine to 0.6 mg daily -Prednisone 40 mg daily: Once signs and symptoms began to resolve taper over 7 days. -Uric acid goal<6 mg/dL  Essential hypertension - stable - HCTZ 12.5 mg daily -Metoprolol 25 mg daily   Hyperlipemia - stable - Lipitor 80 mg daily   Asthma - stable no acute exacerbations - Continue home treatment   Goals of care -Ensure patient leaves with note to allow him to return to work    DVT prophylaxis: Lovenox Code Status: Full Family Communication: Wife at bedside discussed plan of care Disposition Plan: Home after resolution cellulitis   Consultants:  None  Procedures/Significant Events:    Cultures 9/24 blood right/left hand NGTD   Antimicrobials: Clindamycin 9/23>>    Devices None   LINES / TUBES:  None    Continuous Infusions:    Objective: Vitals:   07/07/16 1500 07/07/16 2147 07/08/16 0530 07/08/16 1331  BP: 112/66 104/65 (!) 106/54 115/68  Pulse: 67 76 71 62  Resp:  16 16 16   Temp: 99.4 F (37.4 C) 98.6 F (37 C) 98.9 F (37.2 C) 97.8 F (36.6 C)  TempSrc: Oral Oral Oral Oral  SpO2: 96% 95% 98% 98%  Weight:      Height:  Intake/Output Summary (Last 24 hours) at 07/08/16 1726 Last data filed at 07/08/16 1230  Gross per 24 hour  Intake              240 ml  Output              250 ml  Net              -10 ml   Filed Weights   07/05/16 1510 07/05/16 2102  Weight: 86.2 kg (190 lb) 86.1 kg (189 lb 13.1 oz)    Examination:  General: A/O 4, No acute respiratory distress Eyes: negative scleral hemorrhage, negative anisocoria, negative icterus ENT: Negative Runny nose, negative gingival bleeding, Neck:  Negative scars, masses, torticollis, lymphadenopathy, JVD Lungs:  Clear to auscultation bilaterally without wheezes or crackles Cardiovascular: Regular rate and rhythm without murmur gallop or rub normal S1 and S2 Abdomen: negative abdominal pain, nondistended, positive soft, bowel sounds, no rebound, no ascites, no appreciable mass Extremities: RLE knee is swollen, erythematous, warm to touch, painful to flexation. Right lower ankle and foot swollen, painful to palpation especially on the lateral aspect: Painful to plantarflex/dorsiflex. Negative pain to palpation right calf Skin: See extremities  Psychiatric:  Negative depression, negative anxiety, negative fatigue, negative mania  Central nervous system:  Cranial nerves II through XII intact, tongue/uvula midline, all extremities muscle strength 5/5, sensation intact throughout, negative dysarthria, negative expressive aphasia, negative receptive aphasia.           Data Reviewed: Care during the described time interval was provided by me .  I have reviewed this patient's available data, including medical history, events of note, physical examination, and all test results as part of my evaluation. I have personally reviewed and interpreted all radiology studies.  CBC:  Recent Labs Lab 07/05/16 1620 07/06/16 0313 07/07/16 0502  WBC 11.3* 9.8 9.8  NEUTROABS 8.8* 7.1 7.6  HGB 12.9* 11.8* 11.8*  HCT 38.5* 36.2* 35.7*  MCV 88.1 89.2 86.4  PLT 228 225 242   Basic Metabolic Panel:  Recent Labs Lab 07/05/16 1620 07/06/16 0313  NA 137 139  K 3.8 3.5  CL 100* 103  CO2 29 27  GLUCOSE 108* 98  BUN 20 12  CREATININE 1.04 0.99  CALCIUM 8.8* 8.1*   GFR: Estimated Creatinine Clearance: 75.6 mL/min (by C-G formula based on SCr of 0.99 mg/dL). Liver Function Tests:  Recent Labs Lab 07/06/16 0313  AST 17  ALT 16*  ALKPHOS 70  BILITOT 0.7  PROT 6.1*  ALBUMIN 3.1*   No results for input(s): LIPASE, AMYLASE in the last 168 hours. No results for input(s): AMMONIA in the last 168  hours. Coagulation Profile: No results for input(s): INR, PROTIME in the last 168 hours. Cardiac Enzymes: No results for input(s): CKTOTAL, CKMB, CKMBINDEX, TROPONINI in the last 168 hours. BNP (last 3 results) No results for input(s): PROBNP in the last 8760 hours. HbA1C: No results for input(s): HGBA1C in the last 72 hours. CBG: No results for input(s): GLUCAP in the last 168 hours. Lipid Profile: No results for input(s): CHOL, HDL, LDLCALC, TRIG, CHOLHDL, LDLDIRECT in the last 72 hours. Thyroid Function Tests: No results for input(s): TSH, T4TOTAL, FREET4, T3FREE, THYROIDAB in the last 72 hours. Anemia Panel: No results for input(s): VITAMINB12, FOLATE, FERRITIN, TIBC, IRON, RETICCTPCT in the last 72 hours. Sepsis Labs: No results for input(s): PROCALCITON, LATICACIDVEN in the last 168 hours.  Recent Results (from the past 240 hour(s))  Culture, blood (Routine X 2) w  Reflex to ID Panel     Status: None (Preliminary result)   Collection Time: 07/06/16  6:34 PM  Result Value Ref Range Status   Specimen Description BLOOD RIGHT HAND  Final   Special Requests BOTTLES DRAWN AEROBIC ONLY 5CC  Final   Culture NO GROWTH 2 DAYS  Final   Report Status PENDING  Incomplete  Culture, blood (Routine X 2) w Reflex to ID Panel     Status: None (Preliminary result)   Collection Time: 07/06/16  6:37 PM  Result Value Ref Range Status   Specimen Description BLOOD LEFT HAND  Final   Special Requests BOTTLES DRAWN AEROBIC AND ANAEROBIC 5CC  Final   Culture NO GROWTH 2 DAYS  Final   Report Status PENDING  Incomplete         Radiology Studies: Dg Foot Complete Right  Result Date: 07/08/2016 CLINICAL DATA:  Cellulitis.  Pain. EXAM: RIGHT FOOT COMPLETE - 3+ VIEW COMPARISON:  None. FINDINGS: There is no evidence of fracture or dislocation. There is no evidence of arthropathy or other focal bone abnormality. Soft tissues are unremarkable. IMPRESSION: Negative. Electronically Signed   By: Gerome Sam III M.D   On: 07/08/2016 13:21        Scheduled Meds: . aspirin EC  81 mg Oral Daily  . atorvastatin  80 mg Oral q1800  . budesonide  1 mg Nebulization BID  . clindamycin (CLEOCIN) IV  900 mg Intravenous Q6H  . enoxaparin (LOVENOX) injection  40 mg Subcutaneous Q24H  . hydrochlorothiazide  12.5 mg Oral Daily  . metoprolol tartrate  25 mg Oral Daily  . predniSONE  40 mg Oral Q breakfast  . sodium chloride flush  3 mL Intravenous Q12H   Continuous Infusions:    LOS: 2 days    Time spent:40 min     Caasi Giglia, Roselind Messier, MD [facility] Pager [phone]  If 7PM-7AM, please contact night-coverage [email or web address] Password [facility] 07/08/2016, 5:26 PM

## 2016-07-09 DIAGNOSIS — L03115 Cellulitis of right lower limb: Secondary | ICD-10-CM | POA: Diagnosis not present

## 2016-07-09 DIAGNOSIS — J4520 Mild intermittent asthma, uncomplicated: Secondary | ICD-10-CM | POA: Diagnosis not present

## 2016-07-09 MED ORDER — ALLOPURINOL 100 MG PO TABS: 100.0000 mg | ORAL_TABLET | Freq: Every day | ORAL | 1 refills | Status: DC

## 2016-07-09 MED ORDER — CLINDAMYCIN HCL 300 MG PO CAPS: 300.0000 mg | ORAL_CAPSULE | Freq: Four times a day (QID) | ORAL | 0 refills | Status: AC

## 2016-07-09 MED ORDER — PREDNISONE 20 MG PO TABS: 40.0000 mg | ORAL_TABLET | Freq: Every day | ORAL | 0 refills | Status: AC

## 2016-07-09 NOTE — Discharge Summary (Addendum)
Physician Discharge Summary  Kevin Guzman MRN: [id number] DOB/AGE: [birth year] 65 y.o.  PCP: Verlon Au, MD   Admit date: 07/05/2016 Discharge date: 07/09/2016  Discharge Diagnoses:   Principal Problem:   Cellulitis Active Problems:   Hyperlipemia   Essential hypertension   Coronary atherosclerosis of native coronary artery   Asthma   Cellulitis of right lower extremity   Acute gout    Follow-up recommendations Follow-up with PCP in 3-5 days , including all  additional recommended appointments as below Follow-up CBC, CMP in 3-5 days       Current Discharge Medication List    START taking these medications   Details  allopurinol (ZYLOPRIM) 100 MG tablet Take 1 tablet (100 mg total) by mouth daily. Qty: 30 tablet, Refills: 1    clindamycin (CLEOCIN) 300 MG capsule Take 1 capsule (300 mg total) by mouth 4 (four) times daily. Qty: 20 capsule, Refills: 0    predniSONE (DELTASONE) 20 MG tablet Take 2 tablets (40 mg total) by mouth daily with breakfast. Qty: 6 tablet, Refills: 0      CONTINUE these medications which have NOT CHANGED   Details  aspirin 81 MG EC tablet Take 81 mg by mouth daily.      atorvastatin (LIPITOR) 80 MG tablet TAKE 1 TABLET (80 MG TOTAL) BY MOUTH DAILY AT 6 PM. Qty: 90 tablet, Refills: 0    beclomethasone (QVAR) 80 MCG/ACT inhaler Inhale 1 puff into the lungs 2 (two) times daily.    Coenzyme Q10 (CO Q 10 PO) Take 1 capsule by mouth daily.    hydrochlorothiazide (MICROZIDE) 12.5 MG capsule TAKE 1 CAPSULE (12.5 MG TOTAL) BY MOUTH DAILY. Qty: 90 capsule, Refills: 3    metoprolol tartrate (LOPRESSOR) 25 MG tablet TAKE ONE TABLET   BY MOUTH   EVERY 12 HOURS Qty: 60 tablet, Refills: 6    albuterol (PROVENTIL HFA;VENTOLIN HFA) 108 (90 BASE) MCG/ACT inhaler Inhale 2 puffs into the lungs every 4 (four) hours as needed for wheezing or shortness of breath. Qty: 1 Inhaler, Refills: 0    albuterol (PROVENTIL) (2.5 MG/3ML) 0.083%  nebulizer solution Take 2.5 mg by nebulization every 6 (six) hours as needed for wheezing or shortness of breath.    colchicine 0.6 MG tablet Take 0.6 mg by mouth daily as needed (FOR GOUT FLARE UP).    Melatonin 5 MG CAPS Take 5 mg by mouth daily as needed (sleep).          Discharge Condition: stable  Discharge Instructions Get Medicines reviewed and adjusted: Please take all your medications with you for your next visit with your Primary MD  Please request your Primary MD to go over all hospital tests and procedure/radiological results at the follow up, please ask your Primary MD to get all Hospital records sent to his/her office.  If you experience worsening of your admission symptoms, develop shortness of breath, life threatening emergency, suicidal or homicidal thoughts you must seek medical attention immediately by calling 911 or calling your MD immediately if symptoms less severe.  You must read complete instructions/literature along with all the possible adverse reactions/side effects for all the Medicines you take and that have been prescribed to you. Take any new Medicines after you have completely understood and accpet all the possible adverse reactions/side effects.   Do not drive when taking Pain medications.   Do not take more than prescribed Pain, Sleep and Anxiety Medications  Special Instructions: If you have smoked or chewed Tobacco in the last  2 yrs please stop smoking, stop any regular Alcohol and or any Recreational drug use.  Wear Seat belts while driving.  Please note  You were cared for by a hospitalist during your hospital stay. Once you are discharged, your primary care physician will handle any further medical issues. Please note that NO REFILLS for any discharge medications will be authorized once you are discharged, as it is imperative that you return to your primary care physician (or establish a relationship with a primary care physician if you do  not have one) for your aftercare needs so that they can reassess your need for medications and monitor your lab values.  Discharge Instructions    Diet - low sodium heart healthy    Complete by:  As directed    Increase activity slowly    Complete by:  As directed        Allergies  Allergen Reactions  . Almond Meal Anaphylaxis  . Almond Oil Swelling  . Ace Inhibitors Other (See Comments)    coughing      Disposition: 01-Home or Self Care   Consults: none     Significant Diagnostic Studies:  Dg Knee 1-2 Views Right  Result Date: 07/06/2016 CLINICAL DATA:  Pt fell on right knee 1 week ago. C/o pain on lateral side of right knee. Redness, warmth, and swelling are noticeable. EXAM: RIGHT KNEE - 1-2 VIEW COMPARISON:  None. FINDINGS: No evidence of fracture, dislocation, or joint effusion. No evidence of arthropathy or other focal bone abnormality. Soft tissues are unremarkable. IMPRESSION: Negative. Electronically Signed   By: Corlis Leak M.D.   On: 07/06/2016 13:37   US Venous Img Lower Unilateral Right  Result Date: 07/05/2016 CLINICAL DATA:  Right lower extremity pain and edema EXAM: RIGHT LOWER EXTREMITY VENOUS DUPLEX ULTRASOUND TECHNIQUE: Gray-scale sonography with graded compression, as well as color Doppler and duplex ultrasound were performed to evaluate the right lower extremity deep venous system from the level of the common femoral vein and including the common femoral, femoral, profunda femoral, popliteal and calf veins including the posterior tibial, peroneal and gastrocnemius veins when visible. The superficial great saphenous vein was also interrogated. Spectral Doppler was utilized to evaluate flow at rest and with distal augmentation maneuvers in the common femoral, femoral and popliteal veins. COMPARISON:  None. FINDINGS: Contralateral Common Femoral Vein: Respiratory phasicity is normal and symmetric with the symptomatic side. No evidence of thrombus. Normal  compressibility. Common Femoral Vein: No evidence of thrombus. Normal compressibility, respiratory phasicity and response to augmentation. Saphenofemoral Junction: No evidence of thrombus. Normal compressibility and flow on color Doppler imaging. Profunda Femoral Vein: No evidence of thrombus. Normal compressibility and flow on color Doppler imaging. Femoral Vein: No evidence of thrombus. Normal compressibility, respiratory phasicity and response to augmentation. Popliteal Vein: No evidence of thrombus. Normal compressibility, respiratory phasicity and response to augmentation. Calf Veins: No evidence of thrombus. Normal compressibility and flow on color Doppler imaging. Superficial Great Saphenous Vein: No evidence of thrombus. Normal compressibility and flow on color Doppler imaging. Venous Reflux:  None. Other Findings: There is soft tissue edema in the right calf region. IMPRESSION: No evidence of right lower extremity deep venous thrombosis. Left common femoral vein also patent. There is soft tissue edema in the right calf region. Electronically Signed   By: Bretta Bang III M.D.   On: 07/05/2016 17:06   Dg Foot Complete Right  Result Date: 07/08/2016 CLINICAL DATA:  Cellulitis.  Pain. EXAM: RIGHT FOOT COMPLETE - 3+  VIEW COMPARISON:  None. FINDINGS: There is no evidence of fracture or dislocation. There is no evidence of arthropathy or other focal bone abnormality. Soft tissues are unremarkable. IMPRESSION: Negative. Electronically Signed   By: Gerome Samavid  Williams III M.D   On: 07/08/2016 13:21       Filed Weights   07/05/16 1510 07/05/16 2102  Weight: 86.2 kg (190 lb) 86.1 kg (189 lb 13.1 oz)     Microbiology: Recent Results (from the past 240 hour(s))  Culture, blood (Routine X 2) w Reflex to ID Panel     Status: None (Preliminary result)   Collection Time: 07/06/16  6:34 PM  Result Value Ref Range Status   Specimen Description BLOOD RIGHT HAND  Final   Special Requests BOTTLES DRAWN  AEROBIC ONLY 5CC  Final   Culture NO GROWTH 2 DAYS  Final   Report Status PENDING  Incomplete  Culture, blood (Routine X 2) w Reflex to ID Panel     Status: None (Preliminary result)   Collection Time: 07/06/16  6:37 PM  Result Value Ref Range Status   Specimen Description BLOOD LEFT HAND  Final   Special Requests BOTTLES DRAWN AEROBIC AND ANAEROBIC 5CC  Final   Culture NO GROWTH 2 DAYS  Final   Report Status PENDING  Incomplete       Blood Culture    Component Value Date/Time   SDES BLOOD LEFT HAND 07/06/2016 1837   SPECREQUEST BOTTLES DRAWN AEROBIC AND ANAEROBIC 5CC 07/06/2016 1837   CULT NO GROWTH 2 DAYS 07/06/2016 1837   REPTSTATUS PENDING 07/06/2016 1837      Labs: Results for orders placed or performed during the hospital encounter of 07/05/16 (from the past 48 hour(s))  Uric acid     Status: Abnormal   Collection Time: 07/08/16  2:24 PM  Result Value Ref Range   Uric Acid, Serum 7.8 (H) 4.4 - 7.6 mg/dL     Lipid Panel     Component Value Date/Time   CHOL 210 (H) 06/08/2015 1732   TRIG 220.0 (H) 06/08/2015 1732   TRIG 199 (H) 08/18/2006 1507   HDL 46.80 06/08/2015 1732   CHOLHDL 4 06/08/2015 1732   VLDL 44.0 (H) 06/08/2015 1732   LDLCALC 222 (H) 01/15/2015 1322   LDLDIRECT 132.0 06/08/2015 1732     Lab Results  Component Value Date   HGBA1C  05/25/2007    5.7 (NOTE)   The ADA recommends the following therapeutic goals for glycemic   control related to Hgb A1C measurement:   Goal of Therapy:   < 7.0% Hgb A1C   Action Suggested:  > 8.0% Hgb A1C   Ref:  Diabetes Care, 22, Suppl. 1, 1999     Lab Results  Component Value Date   LDLCALC 222 (H) 01/15/2015   CREATININE 0.99 07/06/2016     HPI :   Kevin Guzman is a 65 y.o. male with medical history significant of asthma, CAD, non-STEMI/CABG in 2008, hyperlipidemia, hypertension, testosterone deficiency [facility] of RLE cellulitis.  Per patient, last week he fell sustaining  minor trauma to his right knee while trying to separate 2 dogs that were fighting. He had transient pain, but did not have any further discomfort and had forgotten about this incident until I asked and he was reminded by his wife. He states that he woke up on Thursday morning feeling pain on his knee around 0545, but continue with his normal routine without significant disruption. He states that on  Friday morning he woke up and he is right knee area was "swollen and red" which has persisted through today and has now extended to his thigh and pretibial area, so he decided to go to the emergency department. He denies fever, night sweats, but complains of chills and tenderness in the area.   ED Course: The patient was started on IV clindamycin and was given analgesics. Workup showed elevated sedimentation rate of 68, CRP 23.5, mild anemia and leukocytosis.  HOSPITAL COURSE   Cellulitis RLE from the knee below down to the ankle.  Improved on  IV clindamycin. Symptoms not fully consistent with just a cellulitis. Most likely gout also playing a part in patient's pain. -9/26 Uric acid level= 7.8, on colchicine, prednisone, now started on allopurinol at discharge    blood cultures NGSF - Change clindamycin to  PO for another 5 days  Could discharge on 9/27  PO antibiotics  - Pain medications PRN   Right foot pain acute -Swelling/pain rated at 8/10 overnight.  -Right foot x-ray negative for cause of pain.   Gout flare -See right foot pain -Continue Colchicine to 0.6 mg daily -Prednisone 40 mg daily: for another 3-4 days  -Uric acid goal<6 mg/dL  Essential hypertension - stable - HCTZ 12.5 mg daily -Metoprolol 25 mg daily   Hyperlipemia - stable - Lipitor 80 mg daily   Asthma - stable no acute exacerbations - Continue home treatment     Discharge Exam:   Blood pressure 128/67, pulse 65, temperature 98 F (36.7 C), temperature source Oral, resp. rate 17, height 5\' 4"  (1.626 m),  weight 86.1 kg (189 lb 13.1 oz), SpO2 95 %.  General: A/O 4, No acute respiratory distress Eyes: negative scleral hemorrhage, negative anisocoria, negative icterus ENT: Negative Runny nose, negative gingival bleeding, Neck:  Negative scars, masses, torticollis, lymphadenopathy, JVD Lungs: Clear to auscultation bilaterally without wheezes or crackles Cardiovascular: Regular rate and rhythm without murmur gallop or rub normal S1 and S2 Abdomen: negative abdominal pain, nondistended, positive soft, bowel sounds, no rebound, no ascites, no appreciable mass Extremities: RLE knee is swollen, erythematous, warm to touch, painful to flexation. Right lower ankle and foot swollen, painful to palpation especially on the lateral aspect: Painful to plantarflex/dorsiflex. Negative pain to palpation right calf Skin: See extremities  Psychiatric:  Negative depression, negative anxiety, negative fatigue, negative mania  Central nervous system:  Cranial nerves II through XII intact, tongue/uvula midline, all extremities muscle strength 5/5, sensation intact throughout, negative dysarthria, negative expressive aphasia, negative receptive aphasia.    Follow-up Information    Verlon Au, MD. Schedule an appointment as soon as possible for a visit in 2 day(s).   Specialty:  Family Medicine Why:  hospitsl follow up Contact information: [address] [place] [facility] [place] [address] [phone]           Signed: Richarda Overlie 07/09/2016, 11:54 AM        Time spent >45 mins

## 2016-07-09 NOTE — Progress Notes (Signed)
Discharge instructions RX's and follow up appt explained and provided to patient verbalized understanding. Work note printed and provided to patient. Patient left floor via wheel chair accompanied by staff no c/o pain  Tonie Elsey, Kae HellerMiranda Lynn, RN

## 2016-07-11 LAB — CULTURE, BLOOD (ROUTINE X 2)
Culture: NO GROWTH
Culture: NO GROWTH

## 2016-08-29 ENCOUNTER — Other Ambulatory Visit: Payer: Self-pay | Admitting: Cardiovascular Disease

## 2016-10-27 ENCOUNTER — Other Ambulatory Visit: Payer: Self-pay | Admitting: Cardiovascular Disease

## 2017-02-13 ENCOUNTER — Other Ambulatory Visit: Payer: Self-pay | Admitting: Cardiovascular Disease

## 2017-03-25 ENCOUNTER — Other Ambulatory Visit: Payer: Self-pay | Admitting: Cardiovascular Disease

## 2017-04-29 ENCOUNTER — Other Ambulatory Visit: Payer: Self-pay | Admitting: Cardiovascular Disease

## 2017-07-15 ENCOUNTER — Encounter (INDEPENDENT_AMBULATORY_CARE_PROVIDER_SITE_OTHER): Payer: Self-pay

## 2017-07-15 ENCOUNTER — Ambulatory Visit (INDEPENDENT_AMBULATORY_CARE_PROVIDER_SITE_OTHER): Payer: 59 | Admitting: Cardiovascular Disease

## 2017-07-15 ENCOUNTER — Encounter: Payer: Self-pay | Admitting: Cardiovascular Disease

## 2017-07-15 VITALS — BP 144/76 | HR 62 | Ht 64.0 in | Wt 186.8 lb

## 2017-07-15 DIAGNOSIS — I2510 Atherosclerotic heart disease of native coronary artery without angina pectoris: Secondary | ICD-10-CM | POA: Diagnosis not present

## 2017-07-15 DIAGNOSIS — I10 Essential (primary) hypertension: Secondary | ICD-10-CM

## 2017-07-15 NOTE — Patient Instructions (Signed)
Medication Instructions:  Your provider recommends that you continue on your current medications as directed. Please refer to the Current Medication list given to you today.    Labwork: None  Testing/Procedures: None  Follow-Up: Your provider wants you to follow-up in: 1 year with Dr. Excell Seltzer. You will receive a reminder letter in the mail two months in advance. If you don't receive a letter, please call our office to schedule the follow-up appointment.    Any Other Special Instructions Will Be Listed Below (If Applicable). Our fax number is [phone]. Please send your lab results attn: Orpha Bur, RN    If you need a refill on your cardiac medications before your next appointment, please call your pharmacy.

## 2017-07-15 NOTE — Progress Notes (Signed)
Cardiology Office Note Date:  07/15/2017   ID:  Kevin Guzman, DOB 1951/01/19, MRN [id number]  PCP:  Verlon Au, MD  Cardiologist:  Tonny Bollman, MD    Chief Complaint  Patient presents with  . Follow-up     History of Present Illness: Kevin Guzman is a 66 y.o. male who presents for follow-up evaluation. Patient is followed for coronary artery disease, hypertension, and hyperlipidemia. He initially presented in 2008 with a non-STEMI. He underwent multivessel CABG because of severe multivessel coronary disease. Bypass anatomy is LIMA-LAD, SVG-diagonal, SVG-OM, SVG-ramus. He's had no recurrent ischemic events.  Approximately 2 months ago he described a series of 'twinges' in his chest. Symptoms have completely resolved but he wanted to report this today. Symptoms were limited to a period of 2 days and were nonradiating, localized to the chest/sternal area. Only experiences shortness of breath associated with asthma (exacerbations occur about twice/year. No other complaints.    Past Medical History:  Diagnosis Date  . Asthma   . CAD (coronary artery disease)    a. 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 EF 65% at cath 8/08  . HLD (hyperlipidemia)   . HTN (hypertension)   . Testosterone deficiency     Past Surgical History:  Procedure Laterality Date  . CARDIAC SURGERY    . HERNIA REPAIR      Current Outpatient Prescriptions  Medication Sig Dispense Refill  . albuterol (PROVENTIL HFA;VENTOLIN HFA) 108 (90 Base) MCG/ACT inhaler Inhale 1 puff into the lungs every 4 (four) hours as needed for wheezing or shortness of breath.    Marland Kitchen albuterol (PROVENTIL) (2.5 MG/3ML) 0.083% nebulizer solution Take 2.5 mg by nebulization every 6 (six) hours as needed for wheezing or shortness of breath.    . allopurinol (ZYLOPRIM) 100 MG tablet Take 1 tablet (100 mg total) by mouth daily. 30 tablet 1  . aspirin 81 MG EC tablet Take 81 mg by mouth daily.       Marland Kitchen atorvastatin (LIPITOR) 80 MG tablet TAKE 1 TABLET (80 MG TOTAL) BY MOUTH DAILY AT 6 PM. 90 tablet 1  . beclomethasone (QVAR) 80 MCG/ACT inhaler Inhale 1 puff into the lungs 2 (two) times daily.    Marland Kitchen BREO ELLIPTA 200-25 MCG/INH AEPB Inhale 1 puff into the lungs daily.    . Coenzyme Q10 (CO Q 10 PO) Take 1 capsule by mouth daily.    . colchicine 0.6 MG tablet Take 0.6 mg by mouth daily as needed (FOR GOUT FLARE UP).    . hydrochlorothiazide (MICROZIDE) 12.5 MG capsule Take 1 capsule (12.5 mg total) by mouth daily. *Please keep 07/15/17 appt for further refills* 90 capsule 0  . Melatonin 5 MG CAPS Take 5 mg by mouth daily as needed (sleep).     . metoprolol tartrate (LOPRESSOR) 25 MG tablet TAKE ONE TABLET   BY MOUTH   EVERY 12 HOURS 180 tablet 2   No current facility-administered medications for this visit.     Allergies:   Almond meal; Almond oil; and Ace inhibitors   Social History:  The patient  reports that he has quit smoking. He quit smokeless tobacco use about 18 years ago. He reports that he does not drink alcohol or use drugs.   Family History:  The patient's family history includes Cancer in his mother.    ROS:  Please see the history of present illness.  All other systems are reviewed and negative.  PHYSICAL EXAM: VS:  BP (!) 144/76   Pulse 62   Ht  (1.626 m)   Wt 84.7 kg (186 lb 12.8 oz)   BMI 32.06 kg/m  , BMI Body mass index is 32.06 kg/m. GEN: Well nourished, well developed, in no acute distress  HEENT: normal  Neck: no JVD, no masses. No carotid bruits Cardiac: RRR without murmur or gallop                Respiratory:  clear to auscultation bilaterally, normal work of breathing GI: soft, nontender, nondistended, + BS MS: no deformity or atrophy  Ext: no pretibial edema, pedal pulses 2+= bilaterally Skin: warm and dry, no rash Neuro:  Strength and sensation are intact Psych: euthymic mood, full affect  EKG:  EKG is ordered today. The ekg ordered  today shows NSR 62 bpm, incomplete RBBB  Recent Labs: No results found for requested labs within last 8760 hours.   Lipid Panel     Component Value Date/Time   CHOL 210 (H) 06/08/2015 1732   TRIG 220.0 (H) 06/08/2015 1732   TRIG 199 (H) 08/18/2006 1507   HDL 46.80 06/08/2015 1732   CHOLHDL 4 06/08/2015 1732   VLDL 44.0 (H) 06/08/2015 1732   LDLCALC 222 (H) 01/15/2015 1322   LDLDIRECT 132.0 06/08/2015 1732      Wt Readings from Last 3 Encounters:  07/15/17 84.7 kg (186 lb 12.8 oz)  07/05/16 86.1 kg (189 lb 13.1 oz)  03/21/16 85.2 kg (187 lb 12.8 oz)    ASSESSMENT AND PLAN: 1.  CAD, native vessel, with atypical angina: clearly noncardiac symptoms a few months ago with fleeting localized chest pains which have not recurred. Prior to CABG he was having exertional dyspnea and chest burning. These symptoms have not recurred. Plan clinical follow-up in one year.   2. Essential HTN: BP has been well-controlled, mildly elevated today but he rushed around all afternoon to get here. States generally runs in range of 110-120 systolic/70's diastolic. Will continue same Rx.   3. Hyperlipidemia: continues on a statin (high-intensity - atorva 80 mg). Followed through a work program. I asked him to fax a copy of his labs.   Current medicines are reviewed with the patient today.  The patient does not have concerns regarding medicines.  Labs/ tests ordered today include:  No orders of the defined types were placed in this encounter.   Disposition:   FU one year  Signed, Tonny Bollman, MD  07/15/2017 4:45 PM    [facility] [address] [place], [place], [place]  [address] Phone: [phone]; Fax: [phone]

## 2017-09-02 ENCOUNTER — Other Ambulatory Visit: Payer: Self-pay | Admitting: Cardiovascular Disease

## 2018-01-05 ENCOUNTER — Other Ambulatory Visit: Payer: Self-pay | Admitting: Cardiovascular Disease

## 2018-01-05 MED ORDER — METOPROLOL TARTRATE 25 MG PO TABS: 25.0000 mg | ORAL_TABLET | Freq: Two times a day (BID) | ORAL | 1 refills | Status: DC

## 2018-03-10 ENCOUNTER — Encounter (HOSPITAL_COMMUNITY): Payer: Self-pay | Admitting: Emergency Medicine

## 2018-03-10 ENCOUNTER — Inpatient Hospital Stay (HOSPITAL_COMMUNITY)
Admission: EM | Admit: 2018-03-10 | Discharge: 2018-03-12 | DRG: 202 | Disposition: A | Discharge status: Home / Self Care | Payer: Medicare Other | Attending: Internal Medicine | Admitting: Internal Medicine

## 2018-03-10 ENCOUNTER — Emergency Department (HOSPITAL_COMMUNITY): Payer: Medicare Other

## 2018-03-10 ENCOUNTER — Other Ambulatory Visit: Payer: Self-pay

## 2018-03-10 DIAGNOSIS — I252 Old myocardial infarction: Secondary | ICD-10-CM

## 2018-03-10 DIAGNOSIS — I700 Atherosclerosis of aorta: Secondary | ICD-10-CM | POA: Diagnosis not present

## 2018-03-10 DIAGNOSIS — J4541 Moderate persistent asthma with (acute) exacerbation: Secondary | ICD-10-CM | POA: Diagnosis not present

## 2018-03-10 DIAGNOSIS — E785 Hyperlipidemia, unspecified: Secondary | ICD-10-CM | POA: Diagnosis present

## 2018-03-10 DIAGNOSIS — I25110 Atherosclerotic heart disease of native coronary artery with unstable angina pectoris: Secondary | ICD-10-CM | POA: Diagnosis present

## 2018-03-10 DIAGNOSIS — I10 Essential (primary) hypertension: Secondary | ICD-10-CM | POA: Diagnosis present

## 2018-03-10 DIAGNOSIS — Z7982 Long term (current) use of aspirin: Secondary | ICD-10-CM

## 2018-03-10 DIAGNOSIS — Z951 Presence of aortocoronary bypass graft: Secondary | ICD-10-CM

## 2018-03-10 DIAGNOSIS — J42 Unspecified chronic bronchitis: Secondary | ICD-10-CM | POA: Diagnosis present

## 2018-03-10 DIAGNOSIS — J45901 Unspecified asthma with (acute) exacerbation: Secondary | ICD-10-CM | POA: Diagnosis present

## 2018-03-10 DIAGNOSIS — J4521 Mild intermittent asthma with (acute) exacerbation: Secondary | ICD-10-CM | POA: Diagnosis not present

## 2018-03-10 DIAGNOSIS — J208 Acute bronchitis due to other specified organisms: Secondary | ICD-10-CM | POA: Diagnosis present

## 2018-03-10 DIAGNOSIS — Z87891 Personal history of nicotine dependence: Secondary | ICD-10-CM

## 2018-03-10 DIAGNOSIS — J9601 Acute respiratory failure with hypoxia: Secondary | ICD-10-CM | POA: Diagnosis present

## 2018-03-10 DIAGNOSIS — I2510 Atherosclerotic heart disease of native coronary artery without angina pectoris: Secondary | ICD-10-CM | POA: Diagnosis present

## 2018-03-10 LAB — CBC WITH DIFFERENTIAL/PLATELET
Abs Immature Granulocytes: 0 10*3/uL (ref 0.0–0.1)
Basophils Absolute: 0.1 10*3/uL (ref 0.0–0.1)
Basophils Relative: 1 %
Eosinophils Absolute: 1 10*3/uL — ABNORMAL HIGH (ref 0.0–0.7)
Eosinophils Relative: 9 %
HCT: 42.8 % (ref 39.0–52.0)
Hemoglobin: 14.3 g/dL (ref 13.0–17.0)
Immature Granulocytes: 0 %
Lymphocytes Relative: 14 %
Lymphs Abs: 1.5 10*3/uL (ref 0.7–4.0)
MCH: 29.1 pg (ref 26.0–34.0)
MCHC: 33.4 g/dL (ref 30.0–36.0)
MCV: 87 fL (ref 78.0–100.0)
Monocytes Absolute: 0.6 10*3/uL (ref 0.1–1.0)
Monocytes Relative: 5 %
Neutro Abs: 7.6 10*3/uL (ref 1.7–7.7)
Neutrophils Relative %: 71 %
Platelets: 281 10*3/uL (ref 150–400)
RBC: 4.92 MIL/uL (ref 4.22–5.81)
RDW: 13.2 % (ref 11.5–15.5)
WBC: 10.7 10*3/uL — ABNORMAL HIGH (ref 4.0–10.5)

## 2018-03-10 LAB — COMPREHENSIVE METABOLIC PANEL
ALT: 24 U/L (ref 17–63)
AST: 21 U/L (ref 15–41)
Albumin: 3.9 g/dL (ref 3.5–5.0)
Alkaline Phosphatase: 92 U/L (ref 38–126)
Anion gap: 11 (ref 5–15)
BUN: 17 mg/dL (ref 6–20)
CO2: 27 mmol/L (ref 22–32)
Calcium: 8.9 mg/dL (ref 8.9–10.3)
Chloride: 104 mmol/L (ref 101–111)
Creatinine, Ser: 1.05 mg/dL (ref 0.61–1.24)
GFR calc Af Amer: >60 mL/min (ref >60)
GFR calc non Af Amer: >60 mL/min (ref >60)
Glucose, Bld: 118 mg/dL — ABNORMAL HIGH (ref 65–99)
Potassium: 3.7 mmol/L (ref 3.5–5.1)
Sodium: 142 mmol/L (ref 135–145)
Total Bilirubin: 0.7 mg/dL (ref 0.3–1.2)
Total Protein: 7 g/dL (ref 6.5–8.1)

## 2018-03-10 LAB — TROPONIN I: Troponin I: <0.03 ng/mL (ref <0.03)

## 2018-03-10 MED ORDER — MELATONIN 5 MG PO CAPS: 5.0000 mg | ORAL_CAPSULE | Freq: Every evening | ORAL | Status: DC | PRN

## 2018-03-10 MED ORDER — METOPROLOL TARTRATE 25 MG PO TABS: 25.0000 mg | ORAL_TABLET | Freq: Two times a day (BID) | ORAL | Status: DC

## 2018-03-10 MED ORDER — ALBUTEROL (5 MG/ML) CONTINUOUS INHALATION SOLN
10.0000 mg/h | INHALATION_SOLUTION | Freq: Once | RESPIRATORY_TRACT | Status: AC
  Administered 2018-03-10: 10 mg/h via RESPIRATORY_TRACT
  Filled 2018-03-10: qty 20

## 2018-03-10 MED ORDER — ACETAMINOPHEN 325 MG PO TABS: 650.0000 mg | ORAL_TABLET | Freq: Four times a day (QID) | ORAL | Status: DC | PRN

## 2018-03-10 MED ORDER — ALBUTEROL SULFATE (2.5 MG/3ML) 0.083% IN NEBU
5.0000 mg | INHALATION_SOLUTION | Freq: Once | RESPIRATORY_TRACT | Status: AC
  Administered 2018-03-10: 5 mg via RESPIRATORY_TRACT
  Filled 2018-03-10: qty 6

## 2018-03-10 MED ORDER — ASPIRIN EC 81 MG PO TBEC
81.0000 mg | DELAYED_RELEASE_TABLET | Freq: Every day | ORAL | Status: DC
  Administered 2018-03-10 – 2018-03-12 (×3): 81 mg via ORAL
  Filled 2018-03-10 (×3): qty 1

## 2018-03-10 MED ORDER — ATORVASTATIN CALCIUM 80 MG PO TABS
80.0000 mg | ORAL_TABLET | Freq: Every day | ORAL | Status: DC
  Administered 2018-03-10 – 2018-03-11 (×2): 80 mg via ORAL
  Filled 2018-03-10 (×2): qty 1

## 2018-03-10 MED ORDER — METHYLPREDNISOLONE SODIUM SUCC 125 MG IJ SOLR
60.0000 mg | Freq: Two times a day (BID) | INTRAMUSCULAR | Status: DC
  Administered 2018-03-10 – 2018-03-12 (×4): 60 mg via INTRAVENOUS
  Filled 2018-03-10 (×4): qty 2

## 2018-03-10 MED ORDER — SENNOSIDES-DOCUSATE SODIUM 8.6-50 MG PO TABS: 1.0000 | ORAL_TABLET | Freq: Every evening | ORAL | Status: DC | PRN

## 2018-03-10 MED ORDER — IOPAMIDOL (ISOVUE-370) INJECTION 76%
INTRAVENOUS | Status: AC
Start: 2018-03-10 — End: 2018-03-10
  Administered 2018-03-10: 12:00:00
  Filled 2018-03-10: qty 100

## 2018-03-10 MED ORDER — HYDROCHLOROTHIAZIDE 12.5 MG PO CAPS
12.5000 mg | ORAL_CAPSULE | Freq: Every day | ORAL | Status: DC
  Administered 2018-03-10 – 2018-03-12 (×3): 12.5 mg via ORAL
  Filled 2018-03-10 (×3): qty 1

## 2018-03-10 MED ORDER — ALBUTEROL SULFATE (2.5 MG/3ML) 0.083% IN NEBU: 2.5000 mg | INHALATION_SOLUTION | RESPIRATORY_TRACT | Status: DC | PRN

## 2018-03-10 MED ORDER — HYDROCODONE-ACETAMINOPHEN 5-325 MG PO TABS: 1.0000 | ORAL_TABLET | ORAL | Status: DC | PRN

## 2018-03-10 MED ORDER — ONDANSETRON HCL 4 MG/2ML IJ SOLN: 4.0000 mg | Freq: Four times a day (QID) | INTRAMUSCULAR | Status: DC | PRN

## 2018-03-10 MED ORDER — MAGNESIUM SULFATE 2 GM/50ML IV SOLN
2.0000 g | Freq: Once | INTRAVENOUS | Status: AC
  Administered 2018-03-10: 2 g via INTRAVENOUS
  Filled 2018-03-10: qty 50

## 2018-03-10 MED ORDER — METOPROLOL TARTRATE 25 MG PO TABS
25.0000 mg | ORAL_TABLET | Freq: Every day | ORAL | Status: DC
  Administered 2018-03-11 – 2018-03-12 (×2): 25 mg via ORAL
  Filled 2018-03-10 (×3): qty 1

## 2018-03-10 MED ORDER — ALBUTEROL SULFATE (2.5 MG/3ML) 0.083% IN NEBU: 5.0000 mg | INHALATION_SOLUTION | RESPIRATORY_TRACT | Status: DC | PRN

## 2018-03-10 MED ORDER — ACETAMINOPHEN 650 MG RE SUPP: 650.0000 mg | Freq: Four times a day (QID) | RECTAL | Status: DC | PRN

## 2018-03-10 MED ORDER — MELATONIN 3 MG PO TABS
4.5000 mg | ORAL_TABLET | Freq: Every evening | ORAL | Status: DC | PRN
  Administered 2018-03-11 – 2018-03-12 (×2): 4.5 mg via ORAL
  Filled 2018-03-10 (×2): qty 1.5

## 2018-03-10 MED ORDER — ONDANSETRON HCL 4 MG PO TABS: 4.0000 mg | ORAL_TABLET | Freq: Four times a day (QID) | ORAL | Status: DC | PRN

## 2018-03-10 MED ORDER — SODIUM CHLORIDE 0.9 % IV BOLUS
500.0000 mL | Freq: Once | INTRAVENOUS | Status: AC
  Administered 2018-03-10: 500 mL via INTRAVENOUS

## 2018-03-10 MED ORDER — IPRATROPIUM-ALBUTEROL 0.5-2.5 (3) MG/3ML IN SOLN
3.0000 mL | Freq: Four times a day (QID) | RESPIRATORY_TRACT | Status: DC
  Administered 2018-03-10 – 2018-03-11 (×7): 3 mL via RESPIRATORY_TRACT
  Filled 2018-03-10 (×7): qty 3

## 2018-03-10 MED ORDER — ENOXAPARIN SODIUM 40 MG/0.4ML ~~LOC~~ SOLN
40.0000 mg | SUBCUTANEOUS | Status: DC
  Administered 2018-03-11: 40 mg via SUBCUTANEOUS
  Filled 2018-03-10 (×2): qty 0.4

## 2018-03-10 MED ORDER — IOPAMIDOL (ISOVUE-370) INJECTION 76%
100.0000 mL | Freq: Once | INTRAVENOUS | Status: AC | PRN
  Administered 2018-03-10: 100 mL via INTRAVENOUS

## 2018-03-10 MED ORDER — FAMOTIDINE IN NACL 20-0.9 MG/50ML-% IV SOLN
20.0000 mg | Freq: Two times a day (BID) | INTRAVENOUS | Status: DC
  Administered 2018-03-10 – 2018-03-12 (×4): 20 mg via INTRAVENOUS
  Filled 2018-03-10 (×4): qty 50

## 2018-03-10 MED ORDER — METHYLPREDNISOLONE SODIUM SUCC 125 MG IJ SOLR
125.0000 mg | Freq: Once | INTRAMUSCULAR | Status: AC
  Administered 2018-03-10: 125 mg via INTRAVENOUS
  Filled 2018-03-10: qty 2

## 2018-03-10 MED ORDER — BISACODYL 10 MG RE SUPP: 10.0000 mg | Freq: Every day | RECTAL | Status: DC | PRN

## 2018-03-10 NOTE — ED Notes (Signed)
Regular dinner tray ordered 

## 2018-03-10 NOTE — ED Notes (Signed)
Defuse wheezing present in all lung fields

## 2018-03-10 NOTE — ED Triage Notes (Addendum)
Pt reports hx of asthma, states it has been an ongoing problem for the past few months, saw his pcp who wanted him to come to the hospital for further tx of his asthma, states he does not have all of his insurance in place yet. States he went to get his advair but could not get it because the pharmacy was out. Reports ongoing sob, productive cough and wheezing. States he used his inhaler about an hour ago and breathing tx around 1am. Pt able to speak in full sentences but gets breathless at times. Denies any cp.

## 2018-03-10 NOTE — ED Provider Notes (Addendum)
[name] EMERGENCY DEPARTMENT Provider Note   CSN: [id number] Arrival date & time: 03/10/18  0251     History   Chief Complaint Chief Complaint  Patient presents with  . Asthma    HPI Kevin Guzman is a 67 y.o. male.  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  Patient reports that he has had difficulty controlling his asthma over the past 2 months, and primary care provider has persistently try to get assist patient in getting controller inhalers patient presents due to worsening shortness of breath over the last couple days.  Patient denies any known illnesses at the point when his asthma began to get worse, or other possible triggers.  Patient reports that his shortness of breath is gotten.  So severe that he is progressively unable to do physical activity for the past couple months.  Patient reports that he is dependent on his albuterol inhaler many times throughout the day.  Patient denies any chest pain at present.  Patient describes abdominal tenderness only when coughing.  Patient reports that he frequently has to cough up phlegm, but denies any increased productive cough.  Patient reports he has not noticed any unilateral leg swelling or calf tenderness.  Patient does report extended car trip to the [employer] last week.  Patient otherwise denies any recent hospitalization, recent surgery, or known history of DVT/PE.  Patient does have a smoking history, quit 30 years ago.  Past Medical History:  Diagnosis Date  . Asthma   . CAD (coronary artery disease)    a. 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 EF 65% at cath 8/08  . HLD (hyperlipidemia)   . HTN (hypertension)   . Testosterone deficiency     Patient Active Problem List   Diagnosis Date Noted  . Cellulitis of right lower  extremity   . Acute gout   . Asthma 07/06/2016  . Cellulitis 07/05/2016  . Asthma exacerbation 01/13/2016  . Acute respiratory failure with hypoxia (HCC) 01/13/2016  . Hypokalemia 01/13/2016  . Viral URI 01/13/2016  . Allergic rhinitis 01/14/2013  . Rash and nonspecific skin eruption 03/01/2012  . Scabies 01/20/2012  . Eczema 01/20/2012  . Coronary atherosclerosis of native coronary artery 08/03/2009  . MASS, CHEST WALL 07/27/2009  . Hyperlipemia 07/24/2008  . Essential hypertension 07/24/2008  . URI 07/24/2008  . Asthma, intermittent with acute exacerbation 07/24/2008    Past Surgical History:  Procedure Laterality Date  . CARDIAC SURGERY    . HERNIA REPAIR          Home Medications    Prior to Admission medications   Medication Sig Start Date End Date Taking? Authorizing Provider  albuterol (PROVENTIL HFA;VENTOLIN HFA) 108 (90 Base) MCG/ACT inhaler Inhale 1 puff into the lungs every 4 (four) hours as needed for wheezing or shortness of breath.    [provider]  albuterol (PROVENTIL) (2.5 MG/3ML) 0.083% nebulizer solution Take 2.5 mg by nebulization every 6 (six) hours as needed for wheezing or shortness of breath.    [provider]  allopurinol (ZYLOPRIM) 100 MG tablet Take 1 tablet (100 mg total) by mouth daily. 07/09/16   Richarda Overlie, MD  aspirin 81 MG EC tablet Take 81 mg by mouth daily.      [provider]  atorvastatin (LIPITOR) 80  MG tablet TAKE 1 TABLET (80 MG TOTAL) BY MOUTH DAILY AT 6 PM. 10/28/16   Tonny Bollman, MD  beclomethasone (QVAR) 80 MCG/ACT inhaler Inhale 1 puff into the lungs 2 (two) times daily. 03/21/16   [provider]  BREO ELLIPTA 200-25 MCG/INH AEPB Inhale 1 puff into the lungs daily. 06/11/17   [provider]  Coenzyme Q10 (CO Q 10 PO) Take 1 capsule by mouth daily.    [provider]  colchicine 0.6 MG tablet Take 0.6 mg by mouth daily as needed (FOR GOUT FLARE UP).    [provider]  hydrochlorothiazide (MICROZIDE) 12.5 MG capsule Take 1 capsule (12.5 mg total) by mouth daily. *Please keep 07/15/17 appt for further refills* 04/30/17   Tonny Bollman, MD  Melatonin 5 MG CAPS Take 5 mg by mouth daily as needed (sleep).     [provider]  metoprolol tartrate (LOPRESSOR) 25 MG tablet Take 1 tablet (25 mg total) by mouth every 12 (twelve) hours. 01/05/18   Tonny Bollman, MD    Family History Family History  Problem Relation Age of Onset  . Cancer Mother   . Heart attack Neg Hx   . Stroke Neg Hx     Social History Social History   Tobacco Use  . Smoking status: Former Games developer  . Smokeless tobacco: Former Neurosurgeon    Quit date: 10/13/1998  Substance Use Topics  . Alcohol use: No  . Drug use: No     Allergies   Almond meal; Almond oil; and Ace inhibitors   Review of Systems Review of Systems  Constitutional: Negative for chills and fever.  HENT: Negative for congestion and rhinorrhea.   Respiratory: Positive for cough and shortness of breath. Negative for chest tightness.   Cardiovascular: Negative for chest pain, palpitations and leg swelling.  Gastrointestinal: Negative for abdominal pain, nausea and vomiting.  Musculoskeletal: Negative for back pain and myalgias.  Skin: Negative for rash.  All other systems reviewed and are negative.    Physical Exam Updated Vital Signs BP (!) 152/102   Pulse 74   Temp 97.8 F (36.6 C) (Oral)   Resp 19   SpO2 95%   Physical Exam  Constitutional: He appears well-developed and well-nourished. No distress.  HENT:  Head: Normocephalic and atraumatic.  Mouth/Throat: Oropharynx is clear and moist.  Eyes: Pupils are equal, round, and reactive to light. Conjunctivae and EOM are normal.  Neck: Normal range of motion. Neck supple.  Cardiovascular: Normal rate, regular rhythm, S1 normal, S2 normal and intact distal pulses.  No murmur heard. Patient has slightly increased, 1+ pitting lower  extremity edema of the right lower extremity.  There is no left lower extremity edema.  No calf tenderness bilaterally.  Pulmonary/Chest:  Patient tachypneic, and requiring a couple breaths per sentence on my examination.  Patient exhibits diffuse his return expiratory wheezes in all lung fields.  Abdominal: Soft. He exhibits no distension. There is no tenderness. There is no guarding.  Musculoskeletal: Normal range of motion. He exhibits no edema or deformity.  Lymphadenopathy:    He has no cervical adenopathy.  Neurological: He is alert.  Cranial nerves grossly intact. Patient moves extremities symmetrically and with good coordination.  Skin: Skin is warm and dry. No rash noted. No erythema.  Psychiatric: He has a normal mood and affect. His behavior is normal. Judgment and thought content normal.  Nursing note and vitals reviewed.    ED Treatments / Results  Labs (all labs ordered  are listed, but only abnormal results are displayed) Labs Reviewed  COMPREHENSIVE METABOLIC PANEL - Abnormal; Notable for the following components:      Result Value   Glucose, Bld 118 (*)    All other components within normal limits  CBC WITH DIFFERENTIAL/PLATELET - Abnormal; Notable for the following components:   WBC 10.7 (*)    Eosinophils Absolute 1.0 (*)    All other components within normal limits  TROPONIN I    EKG None  ED ECG REPORT   Date: 03/10/2018  Rate: 95  Rhythm: normal sinus rhythm  QRS Axis: normal  Intervals: normal  ST/T Wave abnormalities: nonspecific ST/T changes  Narrative Interpretation: No evidence of acute ischemia, infarction, or arrhythmia.   Radiology Dg Chest 2 View  Result Date: 03/10/2018 CLINICAL DATA:  Shortness of breath and cough EXAM: CHEST - 2 VIEW COMPARISON:  January 13, 2016 FINDINGS: There is no appreciable edema or consolidation. There is mild perihilar interstitial thickening. Heart size and pulmonary vascularity are normal. No adenopathy. Patient  is status post coronary artery bypass grafting. No bone lesions. There is aortic atherosclerosis. IMPRESSION: Chronic central bronchitis. No edema or consolidation. Heart size normal. There is aortic atherosclerosis. Aortic Atherosclerosis (ICD10-I70.0). Electronically Signed   By: Bretta Bang III M.D.   On: 03/10/2018 10:18    Procedures Procedures (including critical care time)  CRITICAL CARE Performed by: Elisha Ponder   Total critical care time: 35 minutes  Critical care time was exclusive of separately billable procedures and treating other patients.  Critical care was necessary to treat or prevent imminent or life-threatening deterioration (patient required continuous albuterol.)  Critical care was time spent personally by me on the following activities: development of treatment plan with patient and/or surrogate as well as nursing, discussions with consultants, evaluation of patient's response to treatment, examination of patient, obtaining history from patient or surrogate, ordering and performing treatments and interventions, ordering and review of laboratory studies, ordering and review of radiographic studies, pulse oximetry and re-evaluation of patient's condition.   Medications Ordered in ED Medications  ipratropium-albuterol (DUONEB) 0.5-2.5 (3) MG/3ML nebulizer solution 3 mL (has no administration in time range)  magnesium sulfate IVPB 2 g 50 mL (has no administration in time range)  methylPREDNISolone sodium succinate (SOLU-MEDROL) 125 mg/2 mL injection 125 mg (has no administration in time range)  albuterol (PROVENTIL,VENTOLIN) solution continuous neb (has no administration in time range)  albuterol (PROVENTIL) (2.5 MG/3ML) 0.083% nebulizer solution 5 mg (5 mg Nebulization Given 03/10/18 0333)     Initial Impression / Assessment and Plan / ED Course  I have reviewed the triage vital signs and the nursing notes.  Pertinent labs & imaging results that were  available during my care of the patient were reviewed by me and considered in my medical decision making (see chart for details).  Clinical Course as of Mar 10 1809  Wed Mar 10, 2018  1313 Reassessed patient.  Patient continues have diffuse wheezes, but chest is notably more open after CAT. Patient on nasal cannula for comfort. No reports of desaturation from RN.   [AM]    Clinical Course User Index [AM] Elisha Ponder, PA-C    Patient is nontoxic-appearing, but is having increased work of breathing and requiring albuterol multiple times while in the room with me per his personal inhaler.  Place patient on continuous albuterol to assist with breathing.  Patient without any chest pain at this time, but will also perform cardiac work-up on patient.  Additionally, patient does have slightly more edematous right lower extremity that appears asymmetric on my examination.  This is unnoticed by patient.  Due to physical exam finding, as well as no clear precipitant for asthma exacerbation, will order CTPA.  Patient case discussed with Dr. Marily Memos.   Troponin is negative.  Patient exhibits slight leukocytosis of 10.7, likely stress response versus steroid response to receiving Solu-Medrol per PCP yesterday.  Chest x-ray demonstrating no evidence of clear infiltrate.  CTPA demonstrating no evidence of pulmonary embolism, and possible bronchitic changes.  Right lower extremity pitting edema likely related to venous insufficiency related to patient's history of CABG.  Will call for admission.   Patient admitted to [facility] per PA Durenda Age.  Appreciate [facility] involvement in the care of this patient.  Final Clinical Impressions(s) / ED Diagnoses   Final diagnoses:  Moderate persistent asthma with exacerbation  Aortic atherosclerosis [facility])    ED Discharge Orders    None       Delia Chimes 03/10/18 1810    Mesner, Barbara Cower, MD 03/11/18 [address] [place] 03/24/18 1159    Mesner, Barbara Cower, MD 03/24/18 1641

## 2018-03-10 NOTE — ED Provider Notes (Signed)
Medical screening examination/treatment/procedure(s) were conducted as a shared visit with non-physician practitioner(s) and myself.  I personally evaluated the patient during the encounter.  Here with worsening sob even after using inhalers and getting steroids yesterday.  On my exam, on nebulizer but with tachypnea, persistent wheezing and tight breath sounds.   Plan for continued albuterol, steroids, magnesium, reassessment for disposition.   None     Rigley Niess, Barbara Cower, MD 03/11/18 [phone]

## 2018-03-10 NOTE — ED Notes (Signed)
Alyssa PA at bedside with this RN.

## 2018-03-10 NOTE — H&P (Signed)
History and Physical    Kevin Guzman ZOX:[id number] DOB: [birth year] DOA: 03/10/2018   PCP: Verlon Au, MD   Patient coming from:  Home    Chief Complaint:  HPI: Kevin Guzman is a 67 y.o. 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  The patient has tried nebulizer at home, and as of 03/09/2018, who presented to his PCP, at which time he received Solu-Medrol 80 mg injection and and nebulizer treatment, with minimal improvement.  This morning, the shortness of breath was very severe, and PCP recommended that the patient present to the emergency department.  The patient had been admitted in 717 with similar symptoms, which were well controlled with supportive therapy.  Since then, he had not had any new hospitalizations, but at home, he experienced more shortness of breath, nonproductive cough, and he is wheezing had been "bothering him ".  He denies any chest pain, or palpitations.  He denies any productive cough, or sick contacts.  His last treatment, as mentioned above, was to the [employer] 1 week ago.  He denies any history of PE or DVT.  He denies any abdominal pain, nausea, or vomiting, or hemoptysis.  He denies any calf pain, but has noted slightly more swelling on the right lower extremity, although he had not taking his diuretics over the last 2 days.  Nuys any fever or chills or night sweats.  He denies any dysuria, hematuria, or diarrhea.  He denies any new herbal supplements, he denies any tobacco, having quit 30 years ago, alcohol or recreational drug use.   ED Course:  BP 128/60   Pulse 89   Temp 97.8 F (36.6 C) (Oral)   Resp 18   SpO2 94%   Troponin is negative.  Chest x-ray without evidence of clear infiltrate.  CT angio was performed, in view of his lower extremity  swelling, which was negative for PE, there is some possible bronchitic changes, but no clear opacity. Glucose 118.  White count 10.7.  Of note, eosinophils are 1. He received DuoNeb, along with 2 continuous nebulizers,, Solu-Medrol 125 mg x 1, as well as magnesium 2 g, with improvement of his symptoms, although he continues to have some degree of wheezing. Patient is to be admitted for further evaluation, management of his symptoms.   Review of Systems:  As per HPI otherwise all other systems reviewed and are negative  Past Medical History:  Diagnosis Date  . Asthma   . CAD (coronary artery disease)    a. 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 EF 65% at cath 8/08  . HLD (hyperlipidemia)   . HTN (hypertension)   . Testosterone deficiency     Past Surgical History:  Procedure Laterality Date  . CARDIAC SURGERY    . HERNIA REPAIR      Social History Social History   Socioeconomic History  . Marital status: Married    Spouse name: Not on file  . Number of children: Not on file  . Years of education: Not on file  . Highest education level: Not on file  Occupational History  . Not on file  Social Needs  . Financial resource strain: Not on file  . Food insecurity:    Worry: Not on file  Inability: Not on file  . Transportation needs:    Medical: Not on file    Non-medical: Not on file  Tobacco Use  . Smoking status: Former Games developer  . Smokeless tobacco: Former Neurosurgeon    Quit date: 10/13/1998  Substance and Sexual Activity  . Alcohol use: No  . Drug use: No  . Sexual activity: Not on file  Lifestyle  . Physical activity:    Days per week: Not on file    Minutes per session: Not on file  . Stress: Not on file  Relationships  . Social connections:    Talks on phone: Not on file    Gets together: Not on file    Attends religious service: Not on file    Active member of club or organization: Not on file    Attends meetings of clubs or  organizations: Not on file    Relationship status: Not on file  . Intimate partner violence:    Fear of current or ex partner: Not on file    Emotionally abused: Not on file    Physically abused: Not on file    Forced sexual activity: Not on file  Other Topics Concern  . Not on file  Social History Narrative  . Not on file     Allergies  Allergen Reactions  . Almond Meal Anaphylaxis  . Almond Oil Swelling  . Ace Inhibitors Other (See Comments)    coughing    Family History  Problem Relation Age of Onset  . Cancer Mother   . Heart attack Neg Hx   . Stroke Neg Hx       Prior to Admission medications   Medication Sig Start Date End Date Taking? Authorizing Provider  albuterol (PROVENTIL HFA;VENTOLIN HFA) 108 (90 Base) MCG/ACT inhaler Inhale 1 puff into the lungs every 4 (four) hours as needed for wheezing or shortness of breath.   Yes [provider]  albuterol (PROVENTIL) (2.5 MG/3ML) 0.083% nebulizer solution Take 2.5 mg by nebulization every 6 (six) hours as needed for wheezing or shortness of breath.   Yes [provider]  allopurinol (ZYLOPRIM) 100 MG tablet Take 1 tablet (100 mg total) by mouth daily. Patient taking differently: Take 100 mg by mouth daily as needed (for gout).  07/09/16  Yes Richarda Overlie, MD  aspirin 81 MG EC tablet Take 81 mg by mouth daily.     Yes [provider]  atorvastatin (LIPITOR) 80 MG tablet TAKE 1 TABLET (80 MG TOTAL) BY MOUTH DAILY AT 6 PM. Patient taking differently: TAKE 1 TABLET (80 MG TOTAL) BY MOUTH DAILY 10/28/16  Yes Tonny Bollman, MD  hydrochlorothiazide (MICROZIDE) 12.5 MG capsule Take 1 capsule (12.5 mg total) by mouth daily. *Please keep 07/15/17 appt for further refills* Patient taking differently: Take 12.5 mg by mouth daily.  04/30/17  Yes Tonny Bollman, MD  Melatonin 5 MG CAPS Take 5 mg by mouth at bedtime as needed (sleep).    Yes [provider]  metoprolol tartrate (LOPRESSOR) 25 MG  tablet Take 1 tablet (25 mg total) by mouth every 12 (twelve) hours. Patient taking differently: Take 25 mg by mouth daily.  01/05/18  Yes Tonny Bollman, MD  montelukast (SINGULAIR) 10 MG tablet Take 10 mg by mouth daily. 10/27/16  Yes [provider]    Physical Exam:  Vitals:   03/10/18 1222 03/10/18 1230 03/10/18 1245 03/10/18 1315  BP: 133/63 127/60 117/63 128/60  Pulse: 83 79 83 89  Resp: 13 (!)  Temp:      TempSrc:      SpO2: 92% 94% 92% 94%   Constitutional: NAD, more comfortable after nebulization, and on 2 L of oxygen.   Eyes: PERRL, lids and conjunctivae normal ENMT: Mucous membranes are moist, without exudate or lesions  Neck: normal, supple, no masses, no thyromegaly Respiratory: He has no tachypnea at the time of exam, and is able to answer questions with full sentences, but there are diffuse expiratory wheezes in all lung fields.  No accessory muscle use  cardiovascular: Regular rate and rhythm,  murmur, rubs or gallops. No extremity edema. 2+ pedal pulses. No carotid bruits.  Abdomen: Soft, non tender, No hepatosplenomegaly. Bowel sounds positive.  Musculoskeletal: no clubbing / cyanosis. Moves all extremities Skin: no jaundice, No lesions.  Neurologic: Sensation intact  Strength equal in all extremities Psychiatric:   Alert and oriented x 3. Normal mood.     Labs on Admission: I have personally reviewed following labs and imaging studies  CBC: Recent Labs  Lab 03/10/18 0355  WBC 10.7*  NEUTROABS 7.6  HGB 14.3  HCT 42.8  MCV 87.0  PLT 281    Basic Metabolic Panel: Recent Labs  Lab 03/10/18 0355  NA 142  K 3.7  CL 104  CO2 27  GLUCOSE 118*  BUN 17  CREATININE 1.05  CALCIUM 8.9    GFR: CrCl cannot be calculated (Unknown ideal weight.).  Liver Function Tests: Recent Labs  Lab 03/10/18 0355  AST 21  ALT 24  ALKPHOS 92  BILITOT 0.7  PROT 7.0  ALBUMIN 3.9   No results for input(s): LIPASE, AMYLASE in the last 168  hours. No results for input(s): AMMONIA in the last 168 hours.  Coagulation Profile: No results for input(s): INR, PROTIME in the last 168 hours.  Cardiac Enzymes: Recent Labs  Lab 03/10/18 1042  TROPONINI <0.03    BNP (last 3 results) No results for input(s): PROBNP in the last 8760 hours.  HbA1C: No results for input(s): HGBA1C in the last 72 hours.  CBG: No results for input(s): GLUCAP in the last 168 hours.  Lipid Profile: No results for input(s): CHOL, HDL, LDLCALC, TRIG, CHOLHDL, LDLDIRECT in the last 72 hours.  Thyroid Function Tests: No results for input(s): TSH, T4TOTAL, FREET4, T3FREE, THYROIDAB in the last 72 hours.  Anemia Panel: No results for input(s): VITAMINB12, FOLATE, FERRITIN, TIBC, IRON, RETICCTPCT in the last 72 hours.  Urine analysis:    Component Value Date/Time   COLORURINE YELLOW 05/25/2007 1704   APPEARANCEUR CLEAR 05/25/2007 1704   LABSPEC 1.005 05/25/2007 1704   PHURINE 6.5 05/25/2007 1704   GLUCOSEU NEGATIVE 05/25/2007 1704   HGBUR NEGATIVE 05/25/2007 1704   BILIRUBINUR NEGATIVE 05/25/2007 1704   KETONESUR NEGATIVE 05/25/2007 1704   PROTEINUR NEGATIVE 05/25/2007 1704   UROBILINOGEN 0.2 05/25/2007 1704   NITRITE NEGATIVE 05/25/2007 1704   LEUKOCYTESUR  05/25/2007 1704    NEGATIVE MICROSCOPIC NOT DONE ON URINES WITH NEGATIVE PROTEIN, BLOOD, LEUKOCYTES, NITRITE, OR GLUCOSE <1000 mg/dL.    Sepsis Labs: (procalcitonin:4,lacticidven:4) )No results found for this or any previous visit (from the past 240 hour(s)).   Radiological Exams on Admission: Dg Chest 2 View  Result Date: 03/10/2018 CLINICAL DATA:  Shortness of breath and cough EXAM: CHEST - 2 VIEW COMPARISON:  January 13, 2016 FINDINGS: There is no appreciable edema or consolidation. There is mild perihilar interstitial thickening. Heart size and pulmonary vascularity are normal. No adenopathy. Patient is status post coronary  artery bypass grafting. No bone lesions. There is  aortic atherosclerosis. IMPRESSION: Chronic central bronchitis. No edema or consolidation. Heart size normal. There is aortic atherosclerosis. Aortic Atherosclerosis (ICD10-I70.0). Electronically Signed   By: Bretta Bang III M.D.   On: 03/10/2018 10:18   Ct Angio Chest Pe W/cm &/or Wo Cm  Result Date: 03/10/2018 CLINICAL DATA:  Shortness of breath EXAM: CT ANGIOGRAPHY CHEST WITH CONTRAST TECHNIQUE: Multidetector CT imaging of the chest was performed using the standard protocol during bolus administration of intravenous contrast. Multiplanar CT image reconstructions and MIPs were obtained to evaluate the vascular anatomy. CONTRAST:  60 mL ISOVUE-370 IOPAMIDOL (ISOVUE-370) INJECTION 76% COMPARISON:  Chest radiograph Mar 10, 2018 FINDINGS: Cardiovascular: A slight degree of motion artifact is evident. No pulmonary emboli or appreciable. No thoracic aortic aneurysm or dissection. There is mild atherosclerotic calcification in the proximal visualized great vessels. There are foci of aortic atherosclerosis. Patient is status post coronary artery bypass grafting. No pericardial effusion or pericardial thickening is evident. Mediastinum/Nodes: No thyroid lesions are evident. There are scattered subcentimeter mediastinal lymph nodes. No adenopathy by size criteria is evident. No esophageal lesions are appreciable. Lungs/Pleura: There is central peribronchial thickening. There is no lung edema or consolidation. There are scattered foci of mild atelectatic change. No evident pleural effusion or pleural thickening. Upper Abdomen: Visualized upper abdominal structures are unremarkable except for atherosclerotic calcification in the aorta. Musculoskeletal: Patient is status post median sternotomy. There are no blastic or lytic bone lesions. Review of the MIP images confirms the above findings. IMPRESSION: 1. No demonstrable pulmonary embolus. No thoracic aortic aneurysm or dissection. There is atherosclerotic  calcification in the aorta as well as foci of calcification in great vessels. Patient is status post coronary artery bypass grafting. 2. Areas of slight atelectatic change bilaterally. No edema or consolidation. There is central peribronchial thickening, felt to represent a degree of probable chronic bronchitis. 3.  No evident adenopathy by size criteria. Aortic Atherosclerosis (ICD10-I70.0). Electronically Signed   By: Bretta Bang III M.D.   On: 03/10/2018 12:27    EKG: Independently reviewed.  Assessment/Plan Principal Problem:   Asthma, intermittent with acute exacerbation Active Problems:   Hyperlipemia   Essential hypertension   Coronary atherosclerosis of native coronary artery   Asthma exacerbation   Acute respiratory distress due to Asthma exacerbation: Patient has a history of asthma, with last hospitalization 2 years ago and today, this is likely due to recent travel, and environmental exposure.  O2 sats without oxygen were 92%, normal with 2 L nasal cannula.  Chest x-ray without evidence of clear infiltrate.  CT angio was performed, in view of his lower extremity swelling, which was negative for PE, there is some possible bronchitic changes, but no clear opacity.  Afebrile.  White count 10.7.  Of note, eosinophils are 1. He received DuoNeb, along with 2 continuous nebulizers,, Solu-Medrol 125 mg x 1, magnesium 2 g, with improvement of his symptoms, although he continues to have some degree of wheezing. Med surg, will observe overnight Duonebs Q 6 prn / Albuterol Q 4PRN Solumedrol 60 mg bid x24 hrs , may need to consider prednisone taper upon follow up if wheezing continues Pulmonology consult may be needed if symptoms persist or worsen, otherwise patient can benefit from the patient pulmonary evaluation Pepcid 20 mg bid  IV while on steroids Mucinex. Continue oxygen nasal cannula  Hypertension BP 128/60   Pulse 89   Temp 97.8 F (36.6 C) (Oral)   Resp 18  SpO2 94%    Continue home anti-hypertensive medications    Hyperlipidemia Continue home statins  Lower extremity edema.  Acute on Chronic, history of vein harveststatus post CABG.  No indication for CHF at this time.  The patient has no visible cords on exam, or hyperemia, or erythema.  No calf tenderness. Keep legs elevated above heart. Continue diuresis  Patient on  prohylactic anticoagulation with Lovenox   CAD, status post CABG, EKG sinus rhythm without any abnormalities.   Continue ASA, high dose statin, beta blockers    DVT prophylaxis:  Lovenox Code Status:    Full  Family Communication:  Discussed with patient Disposition Plan: Expect patient to be discharged to home after condition improves Consults called:    None  Admission status: Medsurg Obs   Marlowe Kays, PA-C [facility]   Amion text  [phone]   03/10/2018, 2:14 PM

## 2018-03-10 NOTE — ED Notes (Signed)
Paged Dr for lopressor parameters

## 2018-03-10 NOTE — ED Notes (Signed)
Patient transported to CT 

## 2018-03-11 ENCOUNTER — Other Ambulatory Visit: Payer: Self-pay

## 2018-03-11 ENCOUNTER — Observation Stay (HOSPITAL_BASED_OUTPATIENT_CLINIC_OR_DEPARTMENT_OTHER): Payer: Medicare Other

## 2018-03-11 DIAGNOSIS — J208 Acute bronchitis due to other specified organisms: Secondary | ICD-10-CM | POA: Diagnosis present

## 2018-03-11 DIAGNOSIS — I10 Essential (primary) hypertension: Secondary | ICD-10-CM

## 2018-03-11 DIAGNOSIS — I700 Atherosclerosis of aorta: Secondary | ICD-10-CM | POA: Diagnosis present

## 2018-03-11 DIAGNOSIS — J42 Unspecified chronic bronchitis: Secondary | ICD-10-CM | POA: Diagnosis present

## 2018-03-11 DIAGNOSIS — J9601 Acute respiratory failure with hypoxia: Secondary | ICD-10-CM | POA: Diagnosis present

## 2018-03-11 DIAGNOSIS — Z951 Presence of aortocoronary bypass graft: Secondary | ICD-10-CM | POA: Diagnosis not present

## 2018-03-11 DIAGNOSIS — J4521 Mild intermittent asthma with (acute) exacerbation: Secondary | ICD-10-CM | POA: Diagnosis not present

## 2018-03-11 DIAGNOSIS — E785 Hyperlipidemia, unspecified: Secondary | ICD-10-CM | POA: Diagnosis present

## 2018-03-11 DIAGNOSIS — M7989 Other specified soft tissue disorders: Secondary | ICD-10-CM | POA: Diagnosis not present

## 2018-03-11 DIAGNOSIS — I252 Old myocardial infarction: Secondary | ICD-10-CM | POA: Diagnosis not present

## 2018-03-11 DIAGNOSIS — I2510 Atherosclerotic heart disease of native coronary artery without angina pectoris: Secondary | ICD-10-CM | POA: Diagnosis present

## 2018-03-11 DIAGNOSIS — J4541 Moderate persistent asthma with (acute) exacerbation: Secondary | ICD-10-CM | POA: Diagnosis present

## 2018-03-11 DIAGNOSIS — Z87891 Personal history of nicotine dependence: Secondary | ICD-10-CM | POA: Diagnosis not present

## 2018-03-11 DIAGNOSIS — Z7982 Long term (current) use of aspirin: Secondary | ICD-10-CM | POA: Diagnosis not present

## 2018-03-11 LAB — BASIC METABOLIC PANEL
Anion gap: 13 (ref 5–15)
BUN: 12 mg/dL (ref 6–20)
CO2: 26 mmol/L (ref 22–32)
Calcium: 8.7 mg/dL — ABNORMAL LOW (ref 8.9–10.3)
Chloride: 101 mmol/L (ref 101–111)
Creatinine, Ser: 0.98 mg/dL (ref 0.61–1.24)
GFR calc Af Amer: >60 mL/min (ref >60)
GFR calc non Af Amer: >60 mL/min (ref >60)
Glucose, Bld: 141 mg/dL — ABNORMAL HIGH (ref 65–99)
Potassium: 3.7 mmol/L (ref 3.5–5.1)
Sodium: 140 mmol/L (ref 135–145)

## 2018-03-11 LAB — CBC
HCT: 38.9 % — ABNORMAL LOW (ref 39.0–52.0)
Hemoglobin: 12.8 g/dL — ABNORMAL LOW (ref 13.0–17.0)
MCH: 29 pg (ref 26.0–34.0)
MCHC: 32.9 g/dL (ref 30.0–36.0)
MCV: 88 fL (ref 78.0–100.0)
Platelets: 274 10*3/uL (ref 150–400)
RBC: 4.42 MIL/uL (ref 4.22–5.81)
RDW: 13.6 % (ref 11.5–15.5)
WBC: 11 10*3/uL — ABNORMAL HIGH (ref 4.0–10.5)

## 2018-03-11 MED ORDER — IPRATROPIUM-ALBUTEROL 0.5-2.5 (3) MG/3ML IN SOLN
3.0000 mL | Freq: Two times a day (BID) | RESPIRATORY_TRACT | Status: DC
  Administered 2018-03-12: 3 mL via RESPIRATORY_TRACT
  Filled 2018-03-11: qty 3

## 2018-03-11 MED ORDER — ALBUTEROL SULFATE (2.5 MG/3ML) 0.083% IN NEBU
2.5000 mg | INHALATION_SOLUTION | RESPIRATORY_TRACT | Status: DC | PRN
Start: 2018-03-11 — End: 2018-03-12

## 2018-03-11 NOTE — Progress Notes (Addendum)
PROGRESS NOTE   Kevin Guzman  ZOX:[id number]    DOB: 11-Jul-1951    DOA: 03/10/2018  PCP: Verlon Au, MD   I have briefly reviewed patients previous medical records in [facility].  Brief Narrative:  67 year old male with PMH of asthma, CAD, CABG, HLD, HTN, presented with 1 week history of dyspnea, failed home nebulizer treatment, seen at PCPs office 03/09/2018 and treated with a dose of IM Solu-Medrol 80 mg and 2 nebulizing treatments, continued severe dyspnea and advised by PCP to come to the ED.  CTA negative for PE.  Admitted for asthma exacerbation.  Slowly improving.   Assessment & Plan:   Principal Problem:   Asthma, intermittent with acute exacerbation Active Problems:   Hyperlipemia   Essential hypertension   Coronary atherosclerosis of native coronary artery   Asthma exacerbation   Acute asthma exacerbation: Possibly precipitated by acute viral bronchitis or seasonal allergies.  Mildly hypoxic in ED.  CTA chest: No PE, aortic aneurysm or dissection.  Chronic bronchitis.  Treated with bronchodilator nebulizations, IV Solu-Medrol 60 mg every 12 hours, as needed oxygen.  Slowly improving.  Given tenuous course over the last 1 week, failed home nebulizations, outpatient IM steroids and nebulizations, continue additional 24 hours of inpatient treatment prior to discharging home on oral prednisone taper.  Patient agreeable.  Acute respiratory failure with hypoxia: Secondary to asthma exacerbation.  Added incentive spirometry for atelectasis noted on CTA chest.  Resolved.  Essential hypertension: Controlled.  Continue HCTZ and metoprolol.  Hyperlipidemia: Continue statins.  CAD status post CABG: Asymptomatic of chest pain.  Continue statins, aspirin and metoprolol.  Chronic Right lower extremity edema: Continue HCTZ.  Reports recent car trip to [place] and back.  Check lower extremity venous Doppler.   DVT prophylaxis: Lovenox Code Status: Full Family  Communication: None at bedside Disposition: DC home pending further clinical improvement, possibly 5/31.   Consultants:  None  Procedures:  None  Antimicrobials:  None   Subjective: Feels much better, dyspnea improved but not yet at baseline.  Dyspnea on exertion.  Not much wheezing.  No chest pain or cough reported.  Not on home oxygen.  Former smoker.  Reports car trip to [place] and back recently.  Chronic right greater than left lower extremity swelling without pain.  ROS: As above.  Objective:  Vitals:   03/11/18 0535 03/11/18 0836 03/11/18 0913 03/11/18 1123  BP: 118/61  136/68   Pulse: 75  79   Resp: 17  18   Temp: 97.7 F (36.5 C)  98.5 F (36.9 C)   TempSrc: Oral  Oral   SpO2: 91% 93% 94% 93%  Weight:      Height:        Examination:  General exam: Does not middle-aged male, moderately built and overweight, sitting up comfortably in bed. Respiratory system: Slightly diminished breath sounds bilaterally with scattered few expiratory rhonchi but no crackles. Respiratory effort normal. Cardiovascular system: S1 & S2 heard, RRR. No JVD, murmurs, rubs, gallops or clicks.  Trace ankle edema.  Telemetry personally reviewed: Sinus rhythm. Gastrointestinal system: Abdomen is nondistended, soft and nontender. No organomegaly or masses felt. Normal bowel sounds heard. Central nervous system: Alert and oriented. No focal neurological deficits. Extremities: Symmetric 5 x 5 power.  Right leg may be minimally asymmetrically swollen compared to left.  No acute findings otherwise. Skin: No rashes, lesions or ulcers Psychiatry: Judgement and insight appear normal. Mood & affect appropriate.     Data Reviewed:  I have personally reviewed following labs and imaging studies  CBC: Recent Labs  Lab 03/10/18 0355 03/11/18 0421  WBC 10.7* 11.0*  NEUTROABS 7.6  --   HGB 14.3 12.8*  HCT 42.8 38.9*  MCV 87.0 88.0  PLT 281 274   Basic Metabolic Panel: Recent Labs    Lab 03/10/18 0355 03/11/18 0421  NA 142 140  K 3.7 3.7  CL 104 101  CO2 27 26  GLUCOSE 118* 141*  BUN 17 12  CREATININE 1.05 0.98  CALCIUM 8.9 8.7*   Liver Function Tests: Recent Labs  Lab 03/10/18 0355  AST 21  ALT 24  ALKPHOS 92  BILITOT 0.7  PROT 7.0  ALBUMIN 3.9   Cardiac Enzymes: Recent Labs  Lab 03/10/18 1042  TROPONINI <0.03        Radiology Studies: Dg Chest 2 View  Result Date: 03/10/2018 CLINICAL DATA:  Shortness of breath and cough EXAM: CHEST - 2 VIEW COMPARISON:  January 13, 2016 FINDINGS: There is no appreciable edema or consolidation. There is mild perihilar interstitial thickening. Heart size and pulmonary vascularity are normal. No adenopathy. Patient is status post coronary artery bypass grafting. No bone lesions. There is aortic atherosclerosis. IMPRESSION: Chronic central bronchitis. No edema or consolidation. Heart size normal. There is aortic atherosclerosis. Aortic Atherosclerosis (ICD10-I70.0). Electronically Signed   By: Bretta Bang III M.D.   On: 03/10/2018 10:18   Ct Angio Chest Pe W/cm &/or Wo Cm  Result Date: 03/10/2018 CLINICAL DATA:  Shortness of breath EXAM: CT ANGIOGRAPHY CHEST WITH CONTRAST TECHNIQUE: Multidetector CT imaging of the chest was performed using the standard protocol during bolus administration of intravenous contrast. Multiplanar CT image reconstructions and MIPs were obtained to evaluate the vascular anatomy. CONTRAST:  60 mL ISOVUE-370 IOPAMIDOL (ISOVUE-370) INJECTION 76% COMPARISON:  Chest radiograph Mar 10, 2018 FINDINGS: Cardiovascular: A slight degree of motion artifact is evident. No pulmonary emboli or appreciable. No thoracic aortic aneurysm or dissection. There is mild atherosclerotic calcification in the proximal visualized great vessels. There are foci of aortic atherosclerosis. Patient is status post coronary artery bypass grafting. No pericardial effusion or pericardial thickening is evident.  Mediastinum/Nodes: No thyroid lesions are evident. There are scattered subcentimeter mediastinal lymph nodes. No adenopathy by size criteria is evident. No esophageal lesions are appreciable. Lungs/Pleura: There is central peribronchial thickening. There is no lung edema or consolidation. There are scattered foci of mild atelectatic change. No evident pleural effusion or pleural thickening. Upper Abdomen: Visualized upper abdominal structures are unremarkable except for atherosclerotic calcification in the aorta. Musculoskeletal: Patient is status post median sternotomy. There are no blastic or lytic bone lesions. Review of the MIP images confirms the above findings. IMPRESSION: 1. No demonstrable pulmonary embolus. No thoracic aortic aneurysm or dissection. There is atherosclerotic calcification in the aorta as well as foci of calcification in great vessels. Patient is status post coronary artery bypass grafting. 2. Areas of slight atelectatic change bilaterally. No edema or consolidation. There is central peribronchial thickening, felt to represent a degree of probable chronic bronchitis. 3.  No evident adenopathy by size criteria. Aortic Atherosclerosis (ICD10-I70.0). Electronically Signed   By: Bretta Bang III M.D.   On: 03/10/2018 12:27        Scheduled Meds: . aspirin EC  81 mg Oral Daily  . atorvastatin  80 mg Oral q1800  . enoxaparin (LOVENOX) injection  40 mg Subcutaneous Q24H  . hydrochlorothiazide  12.5 mg Oral Daily  . ipratropium-albuterol  3 mL Nebulization QID  .  methylPREDNISolone sodium succinate  60 mg Intravenous Q12H  . metoprolol tartrate  25 mg Oral Daily   Continuous Infusions: . famotidine (PEPCID) IV 20 mg (03/11/18 1306)     LOS: 0 days     Marcellus Scott, MD, FACP, [facility]. [facility] Pager [phone]  If 7PM-7AM, please contact night-coverage [email or web address] Password [email or web address] 03/11/2018, 1:23 PM

## 2018-03-11 NOTE — Progress Notes (Addendum)
New Admission Note:  Arrival Method: In bed from [facility]. Mental Orientation: Alert & Oriented x4 Telemetry: CCMD verified.  Assessment: Completed Skin: Refer to flowsheet IV: Left Upper Arm Pain: 0/10 Safety Measures: Safety Fall Prevention Plan discussed with patient. Admission: Completed [facility] [place] Orientation: Patient has been orientated to the room, unit and the staff.  Orders have been reviewed and are being implemented. Will continue to monitor the patient. Call light has been placed within reach.   Aram Candela, RN  Phone Number: [phone]

## 2018-03-11 NOTE — Progress Notes (Signed)
*  Preliminary Results* Right lower extremity venous duplex completed. Right lower extremity is negative for deep vein thrombosis. There is no evidence of right Baker's cyst.  03/11/2018 3:36 PM  Gertie Fey, BS, RVT, RDCS, RDMS

## 2018-03-12 DIAGNOSIS — I700 Atherosclerosis of aorta: Secondary | ICD-10-CM

## 2018-03-12 DIAGNOSIS — J4541 Moderate persistent asthma with (acute) exacerbation: Principal | ICD-10-CM

## 2018-03-12 MED ORDER — METOPROLOL TARTRATE 25 MG PO TABS: 25.0000 mg | ORAL_TABLET | Freq: Every day | ORAL | Status: DC

## 2018-03-12 MED ORDER — FAMOTIDINE 20 MG PO TABS: 20.0000 mg | ORAL_TABLET | Freq: Two times a day (BID) | ORAL | Status: DC

## 2018-03-12 MED ORDER — PREDNISONE 10 MG PO TABS: ORAL_TABLET | ORAL | 0 refills | Status: DC

## 2018-03-12 NOTE — Discharge Summary (Signed)
Physician Discharge Summary  Kevin Guzman GNF:[id number] DOB: 09/27/1951  PCP: Verlon Au, MD  Admit date: 03/10/2018 Discharge date: 03/12/2018  Recommendations for Outpatient Follow-up:  1. Dr. Virl Son, PCP in 1 week with repeat labs (CBC & BMP). 2. Consider outpatient Pulmonology consultation  Home Health: None Equipment/Devices: None  Discharge Condition: Improved and stable CODE STATUS: Full Diet recommendation: Heart healthy diet.  Discharge Diagnoses:  Principal Problem:   Asthma, intermittent with acute exacerbation Active Problems:   Hyperlipemia   Essential hypertension   Coronary atherosclerosis of native coronary artery   Asthma exacerbation   Brief Summary: 67 year old male with PMH of asthma, CAD, CABG, HLD, HTN, presented with 1 week history of dyspnea, failed home nebulizer treatment, seen at PCPs office 03/09/2018 and treated with a dose of IM Solu-Medrol 80 mg and 2 nebulizing treatments, continued severe dyspnea and advised by PCP to come to the ED.  CTA negative for PE.  Admitted for asthma exacerbation.    Assessment & Plan:   Acute asthma exacerbation: Possibly precipitated by acute viral bronchitis or seasonal allergies.  Mildly hypoxic in ED.  CTA chest: No PE, aortic aneurysm or dissection.  Chronic bronchitis.  Treated with bronchodilator nebulizations, IV Solu-Medrol 60 mg every 12 hours, as needed oxygen.  Clinically improved.  Breathing back to baseline.  Hypoxia resolved.  Patient reports that his PCP has called in a prescription for Advair that he is supposed to pick up, continue.  Discharged on oral prednisone taper, continue home albuterol nebulizations/inhaler as needed and Singulair.  May consider outpatient Pulmonology consultation for further evaluation and management of asthma versus could he have chronic bronchitis/COPD.  Acute respiratory failure with hypoxia: Secondary to asthma exacerbation.  Added incentive spirometry  for atelectasis noted on CTA chest.  Resolved.  Essential hypertension: Controlled.  Continue HCTZ and metoprolol at prior dose that he was taking.  Hyperlipidemia: Continue statins.  CAD status post CABG: Asymptomatic of chest pain.  Continue statins, aspirin and metoprolol.  Chronic Right lower extremity edema: Continue HCTZ.  Reported recent car trip to [place] and back.    Right lower extremity venous Doppler negative for DVT.  Aortic atherosclerosis: Noted on CT.  Continue aspirin, statins and metoprolol.  Consultants:  None  Procedures:  None    Discharge Instructions  Discharge Instructions    Call MD for:  difficulty breathing, headache or visual disturbances   Complete by:  As directed    Call MD for:  extreme fatigue   Complete by:  As directed    Call MD for:  persistant dizziness or light-headedness   Complete by:  As directed    Diet - low sodium heart healthy   Complete by:  As directed    Increase activity slowly   Complete by:  As directed        Medication List    TAKE these medications   albuterol (2.5 MG/3ML) 0.083% nebulizer solution Commonly known as:  PROVENTIL Take 2.5 mg by nebulization every 6 (six) hours as needed for wheezing or shortness of breath.   albuterol 108 (90 Base) MCG/ACT inhaler Commonly known as:  PROVENTIL HFA;VENTOLIN HFA Inhale 1 puff into the lungs every 4 (four) hours as needed for wheezing or shortness of breath.   allopurinol 100 MG tablet Commonly known as:  ZYLOPRIM Take 1 tablet (100 mg total) by mouth daily. What changed:    when to take this  reasons to take this   aspirin 81 MG EC  tablet Take 81 mg by mouth daily.   atorvastatin 80 MG tablet Commonly known as:  LIPITOR TAKE 1 TABLET (80 MG TOTAL) BY MOUTH DAILY AT 6 PM. What changed:  See the new instructions.   hydrochlorothiazide 12.5 MG capsule Commonly known as:  MICROZIDE Take 1 capsule (12.5 mg total) by mouth daily. *Please keep  07/15/17 appt for further refills* What changed:  additional instructions   Melatonin 5 MG Caps Take 5 mg by mouth at bedtime as needed (sleep).   metoprolol tartrate 25 MG tablet Commonly known as:  LOPRESSOR Take 1 tablet (25 mg total) by mouth daily.   montelukast 10 MG tablet Commonly known as:  SINGULAIR Take 10 mg by mouth daily.   predniSONE 10 MG tablet Commonly known as:  DELTASONE Take 4 tabs daily for 3 days, then 3 tabs daily for 3 days, then 2 tabs daily for 3 days, then 1 tab daily for 3 days, then stop.      Follow-up Information    Verlon Au, MD. Schedule an appointment as soon as possible for a visit in 1 week(s).   Specialty:  Family Medicine Why:  To be seen with repeat labs (CBC & BMP).  Consider outpatient pulmonology consultation. Contact information: [address] [place] [address] [phone]          Allergies  Allergen Reactions  . Almond Meal Anaphylaxis  . Almond Oil Swelling  . Ace Inhibitors Other (See Comments)    coughing      Procedures/Studies: Dg Chest 2 View  Result Date: 03/10/2018 CLINICAL DATA:  Shortness of breath and cough EXAM: CHEST - 2 VIEW COMPARISON:  January 13, 2016 FINDINGS: There is no appreciable edema or consolidation. There is mild perihilar interstitial thickening. Heart size and pulmonary vascularity are normal. No adenopathy. Patient is status post coronary artery bypass grafting. No bone lesions. There is aortic atherosclerosis. IMPRESSION: Chronic central bronchitis. No edema or consolidation. Heart size normal. There is aortic atherosclerosis. Aortic Atherosclerosis (ICD10-I70.0). Electronically Signed   By: Bretta Bang III M.D.   On: 03/10/2018 10:18   Ct Angio Chest Pe W/cm &/or Wo Cm  Result Date: 03/10/2018 CLINICAL DATA:  Shortness of breath EXAM: CT ANGIOGRAPHY CHEST WITH CONTRAST TECHNIQUE: Multidetector CT imaging of the chest was performed using the standard protocol  during bolus administration of intravenous contrast. Multiplanar CT image reconstructions and MIPs were obtained to evaluate the vascular anatomy. CONTRAST:  60 mL ISOVUE-370 IOPAMIDOL (ISOVUE-370) INJECTION 76% COMPARISON:  Chest radiograph Mar 10, 2018 FINDINGS: Cardiovascular: A slight degree of motion artifact is evident. No pulmonary emboli or appreciable. No thoracic aortic aneurysm or dissection. There is mild atherosclerotic calcification in the proximal visualized great vessels. There are foci of aortic atherosclerosis. Patient is status post coronary artery bypass grafting. No pericardial effusion or pericardial thickening is evident. Mediastinum/Nodes: No thyroid lesions are evident. There are scattered subcentimeter mediastinal lymph nodes. No adenopathy by size criteria is evident. No esophageal lesions are appreciable. Lungs/Pleura: There is central peribronchial thickening. There is no lung edema or consolidation. There are scattered foci of mild atelectatic change. No evident pleural effusion or pleural thickening. Upper Abdomen: Visualized upper abdominal structures are unremarkable except for atherosclerotic calcification in the aorta. Musculoskeletal: Patient is status post median sternotomy. There are no blastic or lytic bone lesions. Review of the MIP images confirms the above findings. IMPRESSION: 1. No demonstrable pulmonary embolus. No thoracic aortic aneurysm or dissection. There is atherosclerotic calcification  in the aorta as well as foci of calcification in great vessels. Patient is status post coronary artery bypass grafting. 2. Areas of slight atelectatic change bilaterally. No edema or consolidation. There is central peribronchial thickening, felt to represent a degree of probable chronic bronchitis. 3.  No evident adenopathy by size criteria. Aortic Atherosclerosis (ICD10-I70.0). Electronically Signed   By: Bretta BangWilliam  Woodruff III M.D.   On: 03/10/2018 12:27      Subjective: Feels  much better.  Denies complaints.  Wants to know if he can shower.  Dyspnea resolved.  Indicates that breathing is back to baseline.  No cough, chest pain reported.  No leg pain reported.  Has ambulated without difficulty.  As per RN, no acute issues noted.  Discharge Exam:  Vitals:   03/12/18 0426 03/12/18 0500 03/12/18 0742 03/12/18 0923  BP: 139/78   (!) 116/54  Pulse: 71   66  Resp: 20   18  Temp: 98.4 F (36.9 C)   97.8 F (36.6 C)  TempSrc: Oral   Oral  SpO2: 92%  92% 94%  Weight:  83.9 kg (184 lb 15.5 oz)    Height:        General exam: Does not middle-aged male, moderately built and overweight, sitting up comfortably in bed. Respiratory system:  Clear to auscultation without wheezing, rhonchi or crackles.  No increased work of breathing. Cardiovascular system: S1 & S2 heard, RRR. No JVD, murmurs, rubs, gallops or clicks.  Trace right ankle edema.  Telemetry personally reviewed: Sinus rhythm. Gastrointestinal system: Abdomen is nondistended, soft and nontender. No organomegaly or masses felt. Normal bowel sounds heard. Central nervous system: Alert and oriented. No focal neurological deficits. Extremities: Symmetric 5 x 5 power.  Right leg may be minimally asymmetrically swollen compared to left.  No acute findings otherwise. Skin: No rashes, lesions or ulcers Psychiatry: Judgement and insight appear normal. Mood & affect appropriate.       The results of significant diagnostics from this hospitalization (including imaging, microbiology, ancillary and laboratory) are listed below for reference.     Microbiology: No results found for this or any previous visit (from the past 240 hour(s)).   Labs: CBC: Recent Labs  Lab 03/10/18 0355 03/11/18 0421  WBC 10.7* 11.0*  NEUTROABS 7.6  --   HGB 14.3 12.8*  HCT 42.8 38.9*  MCV 87.0 88.0  PLT 281 274   Basic Metabolic Panel: Recent Labs  Lab 03/10/18 0355 03/11/18 0421  NA 142 140  K 3.7 3.7  CL 104 101  CO2 27 26   GLUCOSE 118* 141*  BUN 17 12  CREATININE 1.05 0.98  CALCIUM 8.9 8.7*   Liver Function Tests: Recent Labs  Lab 03/10/18 0355  AST 21  ALT 24  ALKPHOS 92  BILITOT 0.7  PROT 7.0  ALBUMIN 3.9   Cardiac Enzymes: Recent Labs  Lab 03/10/18 1042  TROPONINI <0.03     Time coordinating discharge: 25 minutes  SIGNED:  Marcellus ScottAnand Yuvaan Olander, MD, FACP, [facility]. [facility] Pager [phone]  If 7PM-7AM, please contact night-coverage [email or web address] Password [email or web address] 03/12/2018, 1:13 PM

## 2018-03-12 NOTE — Progress Notes (Signed)
PHARMACIST - PHYSICIAN COMMUNICATION DR:   Waymon Amato CONCERNING: Famotidine IV to Oral Route Change Policy  RECOMMENDATION: This patient is receiving Protonix by the intravenous route.  Based on criteria approved by the Pharmacy and Therapeutics Committee, this drug is being converted to the equivalent oral dose form(s).  DESCRIPTION: These criteria include:  The patient is eating (either orally or via tube) and/or has been taking other orally administered medications for a least 24 hours  There is no active GI bleed or impaired GI absorption noted.   If you have questions about this conversion, please contact the Pharmacy Department    [phone] )  [facility]   [phone] )  [facility]    [phone] )  [facility]   [phone] )  [facility]

## 2018-03-12 NOTE — Discharge Instructions (Signed)
Please get your medications reviewed and adjusted by your Primary MD. ° °Please request your Primary MD to go over all Hospital Tests and Procedure/Radiological results at the follow up, please get all Hospital records sent to your Prim MD by signing hospital release before you go home. ° °If you had Pneumonia of Lung problems at the Hospital: °Please get a 2 view Chest X ray done in 6-8 weeks after hospital discharge or sooner if instructed by your Primary MD. ° °If you have Congestive Heart Failure: °Please call your Cardiologist or Primary MD anytime you have any of the following symptoms:  °1) 3 pound weight gain in 24 hours or 5 pounds in 1 week  °2) shortness of breath, with or without a dry hacking cough  °3) swelling in the hands, feet or stomach  °4) if you have to sleep on extra pillows at night in order to breathe ° °Follow cardiac low salt diet and 1.5 lit/day fluid restriction. ° °If you have diabetes °Accuchecks 4 times/day, Once in AM empty stomach and then before each meal. °Log in all results and show them to your primary doctor at your next visit. °If any glucose reading is under 80 or above 300 call your primary MD immediately. ° °If you have Seizure/Convulsions/Epilepsy: °Please do not drive, operate heavy machinery, participate in activities at heights or participate in high speed sports until you have seen by Primary MD or a Neurologist and advised to do so again. ° °If you had Gastrointestinal Bleeding: °Please ask your Primary MD to check a complete blood count within one week of discharge or at your next visit. Your endoscopic/colonoscopic biopsies that are pending at the time of discharge, will also need to followed by your Primary MD. ° °Get Medicines reviewed and adjusted. °Please take all your medications with you for your next visit with your Primary MD ° °Please request your Primary MD to go over all hospital tests and procedure/radiological results at the follow up, please ask your  Primary MD to get all Hospital records sent to his/her office. ° °If you experience worsening of your admission symptoms, develop shortness of breath, life threatening emergency, suicidal or homicidal thoughts you must seek medical attention immediately by calling 911 or calling your MD immediately  if symptoms less severe. ° °You must read complete instructions/literature along with all the possible adverse reactions/side effects for all the Medicines you take and that have been prescribed to you. Take any new Medicines after you have completely understood and accpet all the possible adverse reactions/side effects.  ° °Do not drive or operate heavy machinery when taking Pain medications.  ° °Do not take more than prescribed Pain, Sleep and Anxiety Medications ° °Special Instructions: If you have smoked or chewed Tobacco  in the last 2 yrs please stop smoking, stop any regular Alcohol  and or any Recreational drug use. ° °Wear Seat belts while driving. ° °Please note °You were cared for by a hospitalist during your hospital stay. If you have any questions about your discharge medications or the care you received while you were in the hospital after you are discharged, you can call the unit and asked to speak with the hospitalist on call if the hospitalist that took care of you is not available. Once you are discharged, your primary care physician will handle any further medical issues. Please note that NO REFILLS for any discharge medications will be authorized once you are discharged, as it is imperative that you   return to your primary care physician (or establish a relationship with a primary care physician if you do not have one) for your aftercare needs so that they can reassess your need for medications and monitor your lab values.  You can reach the hospitalist office at phone [phone] or fax [phone]   If you do not have a primary care physician, you can call [phone] for a physician  referral.   Asthma, Acute Bronchospasm Acute bronchospasm caused by asthma is also referred to as an asthma attack. Bronchospasm means your air passages become narrowed. The narrowing is caused by inflammation and tightening of the muscles in the air tubes (bronchi) in your lungs. This can make it hard to breathe or cause you to wheeze and cough. What are the causes? Possible triggers are:  Animal dander from the skin, hair, or feathers of animals.  Dust mites contained in house dust.  Cockroaches.  Pollen from trees or grass.  Mold.  Cigarette or tobacco smoke.  Air pollutants such as dust, household cleaners, hair sprays, aerosol sprays, paint fumes, strong chemicals, or strong odors.  Cold air or weather changes. Cold air may trigger inflammation. Winds increase molds and pollens in the air.  Strong emotions such as crying or laughing hard.  Stress.  Certain medicines such as aspirin or beta-blockers.  Sulfites in foods and drinks, such as dried fruits and wine.  Infections or inflammatory conditions, such as a flu, cold, or inflammation of the nasal membranes (rhinitis).  Gastroesophageal reflux disease (GERD). GERD is a condition where stomach acid backs up into your esophagus.  Exercise or strenuous activity.  What are the signs or symptoms?  Wheezing.  Excessive coughing, particularly at night.  Chest tightness.  Shortness of breath. How is this diagnosed? Your health care provider will ask you about your medical history and perform a physical exam. A chest X-ray or blood testing may be performed to look for other causes of your symptoms or other conditions that may have triggered your asthma attack. How is this treated? Treatment is aimed at reducing inflammation and opening up the airways in your lungs. Most asthma attacks are treated with inhaled medicines. These include quick relief or rescue medicines (such as bronchodilators) and controller medicines  (such as inhaled corticosteroids). These medicines are sometimes given through an inhaler or a nebulizer. Systemic steroid medicine taken by mouth or given through an IV tube also can be used to reduce the inflammation when an attack is moderate or severe. Antibiotic medicines are only used if a bacterial infection is present. Follow these instructions at home:  Rest.  Drink plenty of liquids. This helps the mucus to remain thin and be easily coughed up. Only use caffeine in moderation and do not use alcohol until you have recovered from your illness.  Do not smoke. Avoid being exposed to secondhand smoke.  You play a critical role in keeping yourself in good health. Avoid exposure to things that cause you to wheeze or to have breathing problems.  Keep your medicines up-to-date and available. Carefully follow your health care providers treatment plan.  Take your medicine exactly as prescribed.  When pollen or pollution is bad, keep windows closed and use an air conditioner or go to places with air conditioning.  Asthma requires careful medical care. See your health care provider for a follow-up as advised. If you are more than [redacted] weeks pregnant and you were prescribed any new medicines, let your obstetrician know about the visit and  how you are doing. Follow up with your health care provider as directed.  After you have recovered from your asthma attack, make an appointment with your outpatient doctor to talk about ways to reduce the likelihood of future attacks. If you do not have a doctor who manages your asthma, make an appointment with a primary care doctor to discuss your asthma. Get help right away if:  You are getting worse.  You have trouble breathing. If severe, call your local emergency services (911 in the [place]  You develop chest pain or discomfort.  You are vomiting.  You are not able to keep fluids down.  You are coughing up yellow, green, brown, or bloody  sputum.  You have a fever and your symptoms suddenly get worse.  You have trouble swallowing. This information is not intended to replace advice given to you by your health care provider. Make sure you discuss any questions you have with your health care provider. Document Released: 01/14/2007 Document Revised: 03/12/2016 Document Reviewed: 04/06/2013 Elsevier Interactive Patient Education  2017 Elsevier Inc.  Dolor abdominal en los adultos Abdominal Pain, Adult El dolor de [place] (abdominal) puede tener muchas causas. La mayora de las veces, el dolor de [place] no es peligroso. Muchos de [employer] de dolor de estmago pueden controlarse y tratarse en casa. Sin embargo, a Occupational psychologist, Chief Technology Officer de [employer] puede ser grave. El mdico intentar descubrir la causa del dolor de [place]. Siga estas indicaciones en su casa:  Tome los medicamentos de venta libre y los recetados solamente como se lo haya indicado el mdico. No tome medicamentos que lo ayuden a Advertising copywriter (laxantes), salvo que el mdico se lo indique.  Beba suficiente lquido para mantener el pis (orina) claro o de color amarillo plido.  Est atento al dolor de estmago para Insurance risk surveyor cambio.  Concurra a todas las visitas de control como se lo haya indicado el mdico. Esto es importante. Comunquese con un mdico si:  El dolor de [address] cambia o [place].  No tiene apetito o baja de peso sin proponrselo.  Tiene dificultades para defecar (est estreido) o heces lquidas (diarrea) durante ms de 2 o 3das.  Siente dolor al orinar o defecar.  El dolor de estmago lo despierta de noche.  El dolor empeora con las comidas, despus de comer o con determinados alimentos.  Vomita y no puede retener nada de lo que ingiere.  Tiene fiebre. Solicite ayuda de inmediato si:  El dolor no desaparece en el tiempo indicado por el mdico.  No puede detener los vmitos.  Siente dolor solamente en zonas especficas del  abdomen, como el lado derecho o la parte inferior izquierda.  Tiene heces con sangre, de color negro o con aspecto alquitranado.  Tiene dolor muy intenso en el vientre, clicos o meteorismo.  Presenta signos de no tener suficientes lquidos o agua en el cuerpo (deshidratacin), por ejemplo: ? [place] es [place], es muy escasa o no orina. ? Labios agrietados. ? [employer]. ? Ojos hundidos. ? Somnolencia. ? Debilidad. Esta informacin no tiene Theme park manager el consejo del mdico. Asegrese de hacerle al mdico cualquier pregunta que tenga. Document Released: 12/26/2008 Document Revised: 12/24/2016 Document Reviewed: 03/12/2016 Elsevier Interactive Patient Education  [employer].

## 2018-03-12 NOTE — Progress Notes (Signed)
Patient discharged to Home with wife . After visit Summary reviewed. Patient capable of reverbalizing medications and follow up visits. No signs and symptoms of distress noted. Patient educated to return to the ED in the case of an emergency. Jacquetta Polhamus RN 

## 2018-03-12 NOTE — Care Management Note (Addendum)
Case Management Note  Patient Details  Name: Kevin Guzman MRN: [id number] Date of Birth: [birth year]  Subjective/Objective:   History of asthma, CAD, CABG, HLD, HTN.  Admitted for Asthma, intermittent with acute exacerbation.  PCP noted.       Action/Plan: Prior to admission patient resided home with spouse.  At discharge patient plans to discharge home to same living situation. Home DME: nebulizer machine.  At discharge patient has transportation home.  Patient able to afford food/medications.  NCM will continue to follow for discharge transition needs.  Expected Discharge Date:   03/12/2018               Expected Discharge Plan:  Home/Self Care  In-House Referral:   N/A  Discharge planning Services  CM Consult  Status of Service:  Completed, signed off  Yancey Flemings, RN 03/12/2018, 12:42 PM

## 2018-12-14 ENCOUNTER — Encounter: Payer: Self-pay | Admitting: Cardiovascular Disease

## 2018-12-15 ENCOUNTER — Ambulatory Visit (INDEPENDENT_AMBULATORY_CARE_PROVIDER_SITE_OTHER): Payer: Medicare Other | Admitting: Cardiovascular Disease

## 2018-12-15 ENCOUNTER — Encounter: Payer: Self-pay | Admitting: Cardiovascular Disease

## 2018-12-15 VITALS — BP 128/86 | HR 70 | Ht 64.0 in | Wt 196.2 lb

## 2018-12-15 DIAGNOSIS — I10 Essential (primary) hypertension: Secondary | ICD-10-CM | POA: Diagnosis not present

## 2018-12-15 DIAGNOSIS — I25118 Atherosclerotic heart disease of native coronary artery with other forms of angina pectoris: Secondary | ICD-10-CM

## 2018-12-15 DIAGNOSIS — E782 Mixed hyperlipidemia: Secondary | ICD-10-CM

## 2018-12-15 NOTE — Patient Instructions (Addendum)
Medication Instructions:  Your provider recommends that you continue on your current medications as directed. Please refer to the Current Medication list given to you today.   If you need a refill on your cardiac medications before your next appointment, please call your pharmacy.   Lab work: Please come FASTING to your stress test. We will check your cholesterol that day. If you have labs (blood work) drawn today and your tests are completely normal, you will receive your results only by: Marland Kitchen MyChart Message (if you have MyChart) OR . A paper copy in the mail If you have any lab test that is abnormal or we need to change your treatment, we will call you to review the results.  Testing/Procedures: Your physician has requested that you have en exercise stress myoview. For further information please visit [email or web address] Please follow instruction sheet, as given.  Follow-Up: At [facility], you and your health needs are our priority.  As part of our continuing mission to provide you with exceptional heart care, we have created designated Provider Care Teams.  These Care Teams include your primary Cardiologist (physician) and Advanced Practice Providers (APPs -  Physician Assistants and Nurse Practitioners) who all work together to provide you with the care you need, when you need it. You will need a follow up appointment in:  12 months.  Please call our office 2 months in advance to schedule this appointment.  You may see Dr. Excell Seltzer or one of the following Advanced Practice Providers on your designated Care Team: Tereso Newcomer, PA-C [place], [place] . Berton Bon, NP

## 2018-12-15 NOTE — Progress Notes (Signed)
Cardiology Office Note:    Date:  12/15/2018   ID:  Kevin Guzman, DOB 1950/10/28, MRN [id number]  PCP:  Verlon Au, MD  Cardiologist:  Tonny Bollman, MD  Electrophysiologist:  None   Referring MD: Verlon Au, MD   Chief Complaint  Patient presents with  . Coronary Artery Disease    History of Present Illness:    Kevin Guzman is a 68 y.o. male with a hx of coronary artery disease, hypertension, and hyperlipidemia. He initially presented in 2008 with a non-STEMI. He underwent multivessel CABG because of severe multivessel coronary disease. Bypass anatomy is LIMA-LAD, SVG-diagonal, SVG-OM, SVG-ramus. He's had no recurrent ischemic events.  The patient is now retired. He's here alone today. 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 He had an episode of chest discomfort while he was singing recently.  States that he slowed down and rested and his symptoms.  This was a pressure-like sensation in the center of his chest.  He has had no other issues.  He has not been engaged in any exercise.  Shortness of breath lightheadedness, heart palpitations, orthopnea, or PND.  He does admit to some leg swelling.  His beta-blocker was discontinued because of concern it was exacerbating his asthma.  He is now taking  Past Medical History:  Diagnosis Date  . Asthma   . CAD (coronary artery disease)    a. 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 EF 65% at cath 8/08  . HLD (hyperlipidemia)   . HTN (hypertension)   . Testosterone deficiency     Past Surgical History:  Procedure Laterality Date  . CARDIAC SURGERY    . HERNIA REPAIR      Current Medications: Current Meds  Medication Sig  . albuterol (PROVENTIL HFA;VENTOLIN HFA) 108 (90 Base) MCG/ACT inhaler Inhale 1 puff into the lungs every 4 (four) hours as needed for wheezing or shortness of breath.  Marland Kitchen albuterol (PROVENTIL) (2.5 MG/3ML) 0.083% nebulizer  solution Take 2.5 mg by nebulization every 6 (six) hours as needed for wheezing or shortness of breath.  . allopurinol (ZYLOPRIM) 100 MG tablet Take 1 tablet (100 mg total) by mouth daily.  Marland Kitchen amLODipine (NORVASC) 5 MG tablet Take 1 tablet by mouth daily.  Marland Kitchen aspirin 81 MG EC tablet Take 81 mg by mouth daily.    Marland Kitchen atorvastatin (LIPITOR) 80 MG tablet TAKE 1 TABLET (80 MG TOTAL) BY MOUTH DAILY AT 6 PM.  . hydrochlorothiazide (MICROZIDE) 12.5 MG capsule Take 1 capsule (12.5 mg total) by mouth daily. *Please keep 07/15/17 appt for further refills*  . Melatonin 5 MG CAPS Take 5 mg by mouth at bedtime as needed (sleep).      Allergies:   Almond meal; Almond oil; and Ace inhibitors   Social History   Socioeconomic History  . Marital status: Married    Spouse name: Not on file  . Number of children: Not on file  . Years of education: Not on file  . Highest education level: Not on file  Occupational History  . Not on file  Social Needs  . Financial resource strain: Not on file  . Food insecurity:    Worry: Not on file    Inability: Not on file  . Transportation needs:    Medical: Not on file    Non-medical: Not on file  Tobacco Use  . Smoking status: Former Games developer  . Smokeless  tobacco: Former Neurosurgeon    Quit date: 10/13/1998  Substance and Sexual Activity  . Alcohol use: No  . Drug use: No  . Sexual activity: Not on file  Lifestyle  . Physical activity:    Days per week: Not on file    Minutes per session: Not on file  . Stress: Not on file  Relationships  . Social connections:    Talks on phone: Not on file    Gets together: Not on file    Attends religious service: Not on file    Active member of club or organization: Not on file    Attends meetings of clubs or organizations: Not on file    Relationship status: Not on file  Other Topics Concern  . Not on file  Social History Narrative  . Not on file     Family History: The patient's family history includes Cancer in his  mother. There is no history of Heart attack or Stroke.  ROS:   Please see the history of present illness.    Positive for rash, chills, chest pressure.  All other systems reviewed and are negative.  EKGs/Labs/Other Studies Reviewed:    The following studies were reviewed today: CTA chest 03/10/2018: IMPRESSION: 1. No demonstrable pulmonary embolus. No thoracic aortic aneurysm or dissection. There is atherosclerotic calcification in the aorta as well as foci of calcification in great vessels. Patient is status post coronary artery bypass grafting.  2. Areas of slight atelectatic change bilaterally. No edema or consolidation. There is central peribronchial thickening, felt to represent a degree of probable chronic bronchitis.  3.  No evident adenopathy by size criteria.  EKG:  EKG is not ordered today.   Recent Labs: 03/10/2018: ALT 24 03/11/2018: BUN 12; Creatinine, Ser 0.98; Hemoglobin 12.8; Platelets 274; Potassium 3.7; Sodium 140  Recent Lipid Panel    Component Value Date/Time   CHOL 210 (H) 06/08/2015 1732   TRIG 220.0 (H) 06/08/2015 1732   TRIG 199 (H) 08/18/2006 1507   HDL 46.80 06/08/2015 1732   CHOLHDL 4 06/08/2015 1732   VLDL 44.0 (H) 06/08/2015 1732   LDLCALC 222 (H) 01/15/2015 1322   LDLDIRECT 132.0 06/08/2015 1732    Physical Exam:    VS:  BP 128/86   Pulse 70   Ht 5\' 4"  (1.626 m)   Wt 196 lb 3.2 oz (89 kg)   SpO2 94%   BMI 33.68 kg/m     Wt Readings from Last 3 Encounters:  12/15/18 196 lb 3.2 oz (89 kg)  03/12/18 184 lb 15.5 oz (83.9 kg)  07/15/17 186 lb 12.8 oz (84.7 kg)     GEN:  Well nourished, well developed in no acute distress HEENT: Normal NECK: No JVD; No carotid bruits LYMPHATICS: No lymphadenopathy CARDIAC: RRR, no murmurs, rubs, gallops RESPIRATORY:  Clear to auscultation without rales, wheezing or rhonchi  ABDOMEN: Soft, non-tender, non-distended MUSCULOSKELETAL: Trace bilateral pretibial edema; No deformity  SKIN: Warm and dry,  eczematous rash both lower extremities NEUROLOGIC:  Alert and oriented x 3 PSYCHIATRIC:  Normal affect   ASSESSMENT:    1. Mixed hyperlipidemia   2. Essential hypertension   3. Coronary artery disease involving native coronary artery of native heart with other form of angina pectoris (HCC)    PLAN:    In order of problems listed above:  1. Last lipids reviewed.  The patient is overdue for lipid panel.  His last LDL cholesterol was above daily.  We discussed lifestyle modification.  Will  update his lab work. 2. Pressure is controlled occasion.  No changes he takes amlodipine and hydrochlorothiazide.  Beta-blocker has been discontinued. 3. The patient is 12 years out from CABG.  He had some chest discomfort associated with singing.  He has had no other clear signs of angina.  I have recommended an exercise stress Myoview scan to evaluate for graft patency and possible ischemic changes.  His medical program is reviewed and will be continued without changes.   Medication Adjustments/Labs and Tests Ordered: Current medicines are reviewed at length with the patient today.  Concerns regarding medicines are outlined above.  Orders Placed This Encounter  Procedures  . Lipid panel  . Hepatic function panel  . MYOCARDIAL PERFUSION IMAGING   No orders of the defined types were placed in this encounter.   Patient Instructions  Medication Instructions:  Your provider recommends that you continue on your current medications as directed. Please refer to the Current Medication list given to you today.   If you need a refill on your cardiac medications before your next appointment, please call your pharmacy.   Lab work: Please come FASTING to your stress test. We will check your cholesterol that day. If you have labs (blood work) drawn today and your tests are completely normal, you will receive your results only by: Marland Kitchen MyChart Message (if you have MyChart) OR . A paper copy in the mail If you  have any lab test that is abnormal or we need to change your treatment, we will call you to review the results.  Testing/Procedures: Your physician has requested that you have en exercise stress myoview. For further information please visit [email or web address] Please follow instruction sheet, as given.  Follow-Up: At [facility], you and your health needs are our priority.  As part of our continuing mission to provide you with exceptional heart care, we have created designated Provider Care Teams.  These Care Teams include your primary Cardiologist (physician) and Advanced Practice Providers (APPs -  Physician Assistants and Nurse Practitioners) who all work together to provide you with the care you need, when you need it. You will need a follow up appointment in:  12 months.  Please call our office 2 months in advance to schedule this appointment.  You may see Dr. Excell Seltzer or one of the following Advanced Practice Providers on your designated Care Team: Tereso Newcomer, PA-C [place], [place] . Berton Bon, NP      Signed, Tonny Bollman, MD  12/15/2018 3:07 PM    [facility]

## 2019-01-06 ENCOUNTER — Other Ambulatory Visit: Payer: Self-pay | Admitting: Cardiovascular Disease

## 2019-01-17 ENCOUNTER — Telehealth: Payer: Self-pay | Admitting: Cardiology

## 2019-01-17 NOTE — Telephone Encounter (Signed)
Dr. Excell Seltzer   Due to mandatory government regulations rearding COVID-19, we are postponing all elective and non-urgent cardiac imaging procedures.   Kevin Guzman is scheduled for a nuclear stress test on 4/16.  As per my discussion with you we will be postpoining his nuclear stress test for 4 weeks.   Robin Moffit and Pearline Cables please cancel study and be placed on call back to reschedule study in 4 weeks.

## 2019-01-27 ENCOUNTER — Other Ambulatory Visit: Payer: Self-pay

## 2019-01-27 ENCOUNTER — Encounter (HOSPITAL_COMMUNITY): Payer: Medicare Other

## 2019-01-27 ENCOUNTER — Other Ambulatory Visit: Payer: Medicare Other | Admitting: *Deleted

## 2019-01-27 DIAGNOSIS — E782 Mixed hyperlipidemia: Secondary | ICD-10-CM

## 2019-01-27 DIAGNOSIS — I25118 Atherosclerotic heart disease of native coronary artery with other forms of angina pectoris: Secondary | ICD-10-CM

## 2019-01-27 LAB — LIPID PANEL
Chol/HDL Ratio: 3.5 ratio (ref 0.0–5.0)
Cholesterol, Total: 191 mg/dL (ref 100–199)
HDL: 55 mg/dL (ref >39)
LDL Calculated: 109 mg/dL — ABNORMAL HIGH (ref 0–99)
Triglycerides: 135 mg/dL (ref 0–149)
VLDL Cholesterol Cal: 27 mg/dL (ref 5–40)

## 2019-01-27 LAB — HEPATIC FUNCTION PANEL
ALT: 25 IU/L (ref 0–44)
AST: 18 IU/L (ref 0–40)
Albumin: 4.5 g/dL (ref 3.8–4.8)
Alkaline Phosphatase: 120 IU/L — ABNORMAL HIGH (ref 39–117)
Bilirubin Total: 0.5 mg/dL (ref 0.0–1.2)
Bilirubin, Direct: 0.14 mg/dL (ref 0.00–0.40)
Total Protein: 6.7 g/dL (ref 6.0–8.5)

## 2019-02-04 ENCOUNTER — Telehealth: Payer: Self-pay

## 2019-02-04 DIAGNOSIS — E782 Mixed hyperlipidemia: Secondary | ICD-10-CM

## 2019-02-04 MED ORDER — EZETIMIBE 10 MG PO TABS: 10.0000 mg | ORAL_TABLET | Freq: Every day | ORAL | 3 refills | Status: DC

## 2019-02-04 NOTE — Telephone Encounter (Signed)
-----   Message from Tonny Bollman, MD sent at 01/30/2019  6:54 PM EDT ----- LDL cholesterol above goal on atorvastatin 80 mg. Please add Zetia 10 mg daily and repeat labs in 3 months. Continue to work on lifestyle modification.

## 2019-02-04 NOTE — Telephone Encounter (Signed)
Reviewed results with patient who verbalized understanding.   Instructed patient to START ZETIA 10 mg daily. His myoview got cancelled due to COVID-10. Will reschedule myoview and fasting labs to August. He was grateful for call and agrees with treatment plan.

## 2019-02-22 ENCOUNTER — Telehealth (HOSPITAL_COMMUNITY): Payer: Self-pay

## 2019-02-22 NOTE — Telephone Encounter (Signed)
Instructions left on the answering machine. Pt requested a text message, but that isn't possible from our phones. Pt asked to call us back with any questions. S.Santino Kinsella EMTP

## 2019-02-24 ENCOUNTER — Ambulatory Visit (HOSPITAL_COMMUNITY): Payer: Medicare Other | Attending: Cardiovascular Disease

## 2019-02-24 ENCOUNTER — Other Ambulatory Visit: Payer: Self-pay

## 2019-02-24 DIAGNOSIS — E782 Mixed hyperlipidemia: Secondary | ICD-10-CM | POA: Diagnosis not present

## 2019-02-24 DIAGNOSIS — I25118 Atherosclerotic heart disease of native coronary artery with other forms of angina pectoris: Secondary | ICD-10-CM | POA: Insufficient documentation

## 2019-02-24 LAB — MYOCARDIAL PERFUSION IMAGING
LV dias vol: 56 mL (ref 62–150)
LV sys vol: 21 mL
Peak HR: 100 {beats}/min
Rest HR: 74 {beats}/min
SRS: 0
SSS: 0
TID: 0.95

## 2019-02-24 MED ORDER — REGADENOSON 0.4 MG/5ML IV SOLN
0.4000 mg | Freq: Once | INTRAVENOUS | Status: AC
  Administered 2019-02-24: 0.4 mg via INTRAVENOUS

## 2019-02-24 MED ORDER — TECHNETIUM TC 99M TETROFOSMIN IV KIT
9.3000 | PACK | Freq: Once | INTRAVENOUS | Status: AC | PRN
  Administered 2019-02-24: 9.3 via INTRAVENOUS
  Filled 2019-02-24: qty 10

## 2019-02-24 MED ORDER — TECHNETIUM TC 99M TETROFOSMIN IV KIT
27.5000 | PACK | Freq: Once | INTRAVENOUS | Status: AC | PRN
  Administered 2019-02-24: 27.5 via INTRAVENOUS
  Filled 2019-02-24: qty 28

## 2019-05-23 ENCOUNTER — Other Ambulatory Visit: Payer: Medicare Other | Admitting: *Deleted

## 2019-05-23 ENCOUNTER — Other Ambulatory Visit: Payer: Self-pay

## 2019-05-23 ENCOUNTER — Encounter (INDEPENDENT_AMBULATORY_CARE_PROVIDER_SITE_OTHER): Payer: Self-pay

## 2019-05-23 DIAGNOSIS — E782 Mixed hyperlipidemia: Secondary | ICD-10-CM

## 2019-05-24 LAB — LIPID PANEL
Chol/HDL Ratio: 3.7 ratio (ref 0.0–5.0)
Cholesterol, Total: 179 mg/dL (ref 100–199)
HDL: 48 mg/dL (ref >39)
LDL Calculated: 99 mg/dL (ref 0–99)
Triglycerides: 159 mg/dL — ABNORMAL HIGH (ref 0–149)
VLDL Cholesterol Cal: 32 mg/dL (ref 5–40)

## 2019-05-24 LAB — HEPATIC FUNCTION PANEL
ALT: 26 IU/L (ref 0–44)
AST: 21 IU/L (ref 0–40)
Albumin: 4.2 g/dL (ref 3.8–4.8)
Alkaline Phosphatase: 124 IU/L — ABNORMAL HIGH (ref 39–117)
Bilirubin Total: 0.4 mg/dL (ref 0.0–1.2)
Bilirubin, Direct: 0.09 mg/dL (ref 0.00–0.40)
Total Protein: 6.5 g/dL (ref 6.0–8.5)

## 2019-06-02 ENCOUNTER — Telehealth: Payer: Self-pay | Admitting: Cardiovascular Disease

## 2019-06-02 DIAGNOSIS — E782 Mixed hyperlipidemia: Secondary | ICD-10-CM

## 2019-06-02 NOTE — Telephone Encounter (Signed)
-----   Message from Sherren Mocha, MD sent at 05/24/2019  4:09 PM EDT ----- He remains above LDL goal on atorvastatin 80 mg and zetia 10 mg daily. Would refer to [facility] for consideration of PCSK9 Rx if he is willing. thanks

## 2019-06-02 NOTE — Telephone Encounter (Signed)
Confirmed with the patient he is taking meds as directed.  Kevin Guzman states he has been very lazy during the pandemic and would like to try being more active prior to starting an injectable drug. He agrees to work on diet and exercise and will recheck fasting labs in December. He requests healthy diet information mailed to his home. He understands information will be mailed tomorrow. He was grateful for assistance.

## 2019-06-02 NOTE — Telephone Encounter (Signed)
Patient is returning call regarding lab results

## 2019-06-03 NOTE — Telephone Encounter (Signed)
Information mailed to patient. Available for viewing in Letters tab.

## 2019-09-13 ENCOUNTER — Other Ambulatory Visit: Payer: Medicare Other

## 2019-09-13 ENCOUNTER — Other Ambulatory Visit: Payer: Self-pay

## 2019-09-13 DIAGNOSIS — E782 Mixed hyperlipidemia: Secondary | ICD-10-CM

## 2019-09-14 LAB — LIPID PANEL
Chol/HDL Ratio: 2.5 ratio (ref 0.0–5.0)
Cholesterol, Total: 159 mg/dL (ref 100–199)
HDL: 63 mg/dL (ref >39)
LDL Chol Calc (NIH): 71 mg/dL (ref 0–99)
Triglycerides: 145 mg/dL (ref 0–149)
VLDL Cholesterol Cal: 25 mg/dL (ref 5–40)

## 2019-12-06 ENCOUNTER — Inpatient Hospital Stay (HOSPITAL_COMMUNITY): Payer: Medicare Other

## 2019-12-06 ENCOUNTER — Other Ambulatory Visit: Payer: Self-pay

## 2019-12-06 ENCOUNTER — Inpatient Hospital Stay (HOSPITAL_COMMUNITY)
Admission: EM | Disposition: A | Discharge status: Home / Self Care | Payer: Self-pay | Source: Home / Self Care | Attending: Cardiovascular Disease

## 2019-12-06 ENCOUNTER — Emergency Department (HOSPITAL_COMMUNITY): Payer: Medicare Other

## 2019-12-06 ENCOUNTER — Inpatient Hospital Stay (HOSPITAL_COMMUNITY)
Admission: EM | Admit: 2019-12-06 | Discharge: 2019-12-08 | DRG: 246 | Disposition: A | Discharge status: Home / Self Care | Payer: Medicare Other | Attending: Cardiovascular Disease | Admitting: Cardiovascular Disease

## 2019-12-06 ENCOUNTER — Encounter (HOSPITAL_COMMUNITY): Payer: Self-pay | Admitting: Emergency Medicine

## 2019-12-06 DIAGNOSIS — I213 ST elevation (STEMI) myocardial infarction of unspecified site: Secondary | ICD-10-CM

## 2019-12-06 DIAGNOSIS — I2510 Atherosclerotic heart disease of native coronary artery without angina pectoris: Secondary | ICD-10-CM | POA: Diagnosis present

## 2019-12-06 DIAGNOSIS — I25710 Atherosclerosis of autologous vein coronary artery bypass graft(s) with unstable angina pectoris: Secondary | ICD-10-CM

## 2019-12-06 DIAGNOSIS — Z951 Presence of aortocoronary bypass graft: Secondary | ICD-10-CM | POA: Diagnosis not present

## 2019-12-06 DIAGNOSIS — I252 Old myocardial infarction: Secondary | ICD-10-CM | POA: Diagnosis not present

## 2019-12-06 DIAGNOSIS — I110 Hypertensive heart disease with heart failure: Secondary | ICD-10-CM | POA: Diagnosis not present

## 2019-12-06 DIAGNOSIS — Z91018 Allergy to other foods: Secondary | ICD-10-CM

## 2019-12-06 DIAGNOSIS — J309 Allergic rhinitis, unspecified: Secondary | ICD-10-CM | POA: Diagnosis present

## 2019-12-06 DIAGNOSIS — Z79899 Other long term (current) drug therapy: Secondary | ICD-10-CM

## 2019-12-06 DIAGNOSIS — Z7982 Long term (current) use of aspirin: Secondary | ICD-10-CM

## 2019-12-06 DIAGNOSIS — I25110 Atherosclerotic heart disease of native coronary artery with unstable angina pectoris: Secondary | ICD-10-CM | POA: Diagnosis present

## 2019-12-06 DIAGNOSIS — I25119 Atherosclerotic heart disease of native coronary artery with unspecified angina pectoris: Secondary | ICD-10-CM | POA: Diagnosis not present

## 2019-12-06 DIAGNOSIS — Z20822 Contact with and (suspected) exposure to covid-19: Secondary | ICD-10-CM | POA: Diagnosis present

## 2019-12-06 DIAGNOSIS — I2119 ST elevation (STEMI) myocardial infarction involving other coronary artery of inferior wall: Principal | ICD-10-CM | POA: Diagnosis present

## 2019-12-06 DIAGNOSIS — I5031 Acute diastolic (congestive) heart failure: Secondary | ICD-10-CM | POA: Diagnosis present

## 2019-12-06 DIAGNOSIS — Z87891 Personal history of nicotine dependence: Secondary | ICD-10-CM

## 2019-12-06 DIAGNOSIS — E782 Mixed hyperlipidemia: Secondary | ICD-10-CM

## 2019-12-06 DIAGNOSIS — I25709 Atherosclerosis of coronary artery bypass graft(s), unspecified, with unspecified angina pectoris: Secondary | ICD-10-CM | POA: Diagnosis not present

## 2019-12-06 DIAGNOSIS — I10 Essential (primary) hypertension: Secondary | ICD-10-CM | POA: Diagnosis not present

## 2019-12-06 DIAGNOSIS — Z955 Presence of coronary angioplasty implant and graft: Secondary | ICD-10-CM

## 2019-12-06 DIAGNOSIS — I2121 ST elevation (STEMI) myocardial infarction involving left circumflex coronary artery: Secondary | ICD-10-CM | POA: Diagnosis not present

## 2019-12-06 DIAGNOSIS — J4521 Mild intermittent asthma with (acute) exacerbation: Secondary | ICD-10-CM | POA: Diagnosis not present

## 2019-12-06 DIAGNOSIS — I950 Idiopathic hypotension: Secondary | ICD-10-CM | POA: Diagnosis not present

## 2019-12-06 DIAGNOSIS — I472 Ventricular tachycardia: Secondary | ICD-10-CM | POA: Diagnosis not present

## 2019-12-06 DIAGNOSIS — Z888 Allergy status to other drugs, medicaments and biological substances status: Secondary | ICD-10-CM

## 2019-12-06 DIAGNOSIS — E291 Testicular hypofunction: Secondary | ICD-10-CM | POA: Diagnosis not present

## 2019-12-06 DIAGNOSIS — I959 Hypotension, unspecified: Secondary | ICD-10-CM | POA: Diagnosis not present

## 2019-12-06 DIAGNOSIS — E785 Hyperlipidemia, unspecified: Secondary | ICD-10-CM | POA: Diagnosis present

## 2019-12-06 DIAGNOSIS — R0602 Shortness of breath: Secondary | ICD-10-CM

## 2019-12-06 HISTORY — PX: LEFT HEART CATH AND CORONARY ANGIOGRAPHY: CATH118249

## 2019-12-06 HISTORY — PX: CORONARY/GRAFT ACUTE MI REVASCULARIZATION: CATH118305

## 2019-12-06 HISTORY — PX: TEMPORARY PACEMAKER: CATH118268

## 2019-12-06 HISTORY — DX: ST elevation (STEMI) myocardial infarction involving other coronary artery of inferior wall: I21.19

## 2019-12-06 HISTORY — DX: ST elevation (STEMI) myocardial infarction of unspecified site: I21.3

## 2019-12-06 LAB — CBC WITH DIFFERENTIAL/PLATELET
Abs Immature Granulocytes: 0.02 10*3/uL (ref 0.00–0.07)
Basophils Absolute: 0.1 10*3/uL (ref 0.0–0.1)
Basophils Relative: 1 %
Eosinophils Absolute: 0.5 10*3/uL (ref 0.0–0.5)
Eosinophils Relative: 5 %
HCT: 40.2 % (ref 39.0–52.0)
Hemoglobin: 13.3 g/dL (ref 13.0–17.0)
Immature Granulocytes: 0 %
Lymphocytes Relative: 16 %
Lymphs Abs: 1.5 10*3/uL (ref 0.7–4.0)
MCH: 30.2 pg (ref 26.0–34.0)
MCHC: 33.1 g/dL (ref 30.0–36.0)
MCV: 91.2 fL (ref 80.0–100.0)
Monocytes Absolute: 0.5 10*3/uL (ref 0.1–1.0)
Monocytes Relative: 5 %
Neutro Abs: 6.4 10*3/uL (ref 1.7–7.7)
Neutrophils Relative %: 73 %
Platelets: 255 10*3/uL (ref 150–400)
RBC: 4.41 MIL/uL (ref 4.22–5.81)
RDW: 13.5 % (ref 11.5–15.5)
WBC: 8.9 10*3/uL (ref 4.0–10.5)
nRBC: 0 % (ref 0.0–0.2)

## 2019-12-06 LAB — HEMOGLOBIN A1C
Hgb A1c MFr Bld: 5.8 % — ABNORMAL HIGH (ref 4.8–5.6)
Mean Plasma Glucose: 119.76 mg/dL

## 2019-12-06 LAB — COMPREHENSIVE METABOLIC PANEL
ALT: 24 U/L (ref 0–44)
AST: 23 U/L (ref 15–41)
Albumin: 3.9 g/dL (ref 3.5–5.0)
Alkaline Phosphatase: 98 U/L (ref 38–126)
Anion gap: 13 (ref 5–15)
BUN: 32 mg/dL — ABNORMAL HIGH (ref 8–23)
CO2: 22 mmol/L (ref 22–32)
Calcium: 8.8 mg/dL — ABNORMAL LOW (ref 8.9–10.3)
Chloride: 104 mmol/L (ref 98–111)
Creatinine, Ser: 1.2 mg/dL (ref 0.61–1.24)
GFR calc Af Amer: >60 mL/min (ref >60)
GFR calc non Af Amer: >60 mL/min (ref >60)
Glucose, Bld: 131 mg/dL — ABNORMAL HIGH (ref 70–99)
Potassium: 4.1 mmol/L (ref 3.5–5.1)
Sodium: 139 mmol/L (ref 135–145)
Total Bilirubin: 0.7 mg/dL (ref 0.3–1.2)
Total Protein: 6.5 g/dL (ref 6.5–8.1)

## 2019-12-06 LAB — TROPONIN I (HIGH SENSITIVITY)
Troponin I (High Sensitivity): 7 ng/L (ref <18)
Troponin I (High Sensitivity): 16435 ng/L (ref <18)
Troponin I (High Sensitivity): 19283 ng/L (ref <18)

## 2019-12-06 LAB — HIV ANTIBODY (ROUTINE TESTING W REFLEX): HIV Screen 4th Generation wRfx: NONREACTIVE

## 2019-12-06 LAB — LIPID PANEL
Cholesterol: 163 mg/dL (ref 0–200)
HDL: 51 mg/dL (ref >40)
LDL Cholesterol: 86 mg/dL (ref 0–99)
Total CHOL/HDL Ratio: 3.2 RATIO
Triglycerides: 132 mg/dL (ref <150)
VLDL: 26 mg/dL (ref 0–40)

## 2019-12-06 LAB — TSH: TSH: 2.756 u[IU]/mL (ref 0.350–4.500)

## 2019-12-06 LAB — POCT ACTIVATED CLOTTING TIME
Activated Clotting Time: 208 seconds
Activated Clotting Time: 252 seconds
Activated Clotting Time: 296 seconds

## 2019-12-06 LAB — PROTIME-INR
INR: 1 (ref 0.8–1.2)
Prothrombin Time: 13.2 seconds (ref 11.4–15.2)

## 2019-12-06 LAB — APTT: aPTT: 28 seconds (ref 24–36)

## 2019-12-06 LAB — MRSA PCR SCREENING: MRSA by PCR: NEGATIVE

## 2019-12-06 SURGERY — LEFT HEART CATH AND CORONARY ANGIOGRAPHY
Anesthesia: LOCAL

## 2019-12-06 MED ORDER — CHLORHEXIDINE GLUCONATE CLOTH 2 % EX PADS
6.0000 | MEDICATED_PAD | Freq: Every day | CUTANEOUS | Status: DC
  Administered 2019-12-06: 6 via TOPICAL

## 2019-12-06 MED ORDER — FUROSEMIDE 10 MG/ML IJ SOLN
INTRAMUSCULAR | Status: AC
  Filled 2019-12-06: qty 4

## 2019-12-06 MED ORDER — HEPARIN SODIUM (PORCINE) 1000 UNIT/ML IJ SOLN
INTRAMUSCULAR | Status: AC
  Filled 2019-12-06: qty 1

## 2019-12-06 MED ORDER — PRASUGREL HCL 10 MG PO TABS
10.0000 mg | ORAL_TABLET | Freq: Every day | ORAL | Status: DC
  Administered 2019-12-07 – 2019-12-08 (×2): 10 mg via ORAL
  Filled 2019-12-06 (×2): qty 1

## 2019-12-06 MED ORDER — ASPIRIN 81 MG PO CHEW
CHEWABLE_TABLET | ORAL | Status: AC
  Filled 2019-12-06: qty 4

## 2019-12-06 MED ORDER — SODIUM CHLORIDE 0.9% FLUSH
3.0000 mL | Freq: Two times a day (BID) | INTRAVENOUS | Status: DC
  Administered 2019-12-06 – 2019-12-07 (×2): 3 mL via INTRAVENOUS

## 2019-12-06 MED ORDER — FENTANYL CITRATE (PF) 100 MCG/2ML IJ SOLN
100.0000 ug | Freq: Once | INTRAMUSCULAR | Status: DC
  Filled 2019-12-06: qty 2

## 2019-12-06 MED ORDER — HEPARIN SODIUM (PORCINE) 1000 UNIT/ML IJ SOLN
INTRAMUSCULAR | Status: DC | PRN
  Administered 2019-12-06: 2000 [IU] via INTRAVENOUS
  Administered 2019-12-06 (×2): 4000 [IU] via INTRAVENOUS

## 2019-12-06 MED ORDER — SODIUM CHLORIDE 0.9 % IV SOLN: 250.0000 mL | INTRAVENOUS | Status: DC | PRN

## 2019-12-06 MED ORDER — ZOLPIDEM TARTRATE 5 MG PO TABS
5.0000 mg | ORAL_TABLET | Freq: Every evening | ORAL | Status: DC | PRN
  Administered 2019-12-06 – 2019-12-08 (×2): 5 mg via ORAL
  Filled 2019-12-06 (×2): qty 1

## 2019-12-06 MED ORDER — HYDRALAZINE HCL 20 MG/ML IJ SOLN: 10.0000 mg | INTRAMUSCULAR | Status: AC | PRN

## 2019-12-06 MED ORDER — ONDANSETRON HCL 4 MG/2ML IJ SOLN: 4.0000 mg | Freq: Four times a day (QID) | INTRAMUSCULAR | Status: DC | PRN

## 2019-12-06 MED ORDER — PRASUGREL HCL 10 MG PO TABS
60.0000 mg | ORAL_TABLET | Freq: Once | ORAL | Status: AC
  Administered 2019-12-06: 22:00:00 60 mg via ORAL
  Filled 2019-12-06: qty 6

## 2019-12-06 MED ORDER — ALBUTEROL SULFATE (2.5 MG/3ML) 0.083% IN NEBU: 2.5000 mg | INHALATION_SOLUTION | Freq: Four times a day (QID) | RESPIRATORY_TRACT | Status: DC | PRN

## 2019-12-06 MED ORDER — SODIUM CHLORIDE 0.9 % IV SOLN: INTRAVENOUS | Status: DC | PRN

## 2019-12-06 MED ORDER — TICAGRELOR 90 MG PO TABS
ORAL_TABLET | ORAL | Status: AC
  Filled 2019-12-06: qty 2

## 2019-12-06 MED ORDER — NITROGLYCERIN 0.4 MG SL SUBL: 0.4000 mg | SUBLINGUAL_TABLET | SUBLINGUAL | Status: DC | PRN

## 2019-12-06 MED ORDER — LIDOCAINE-EPINEPHRINE 1 %-1:100000 IJ SOLN
INTRAMUSCULAR | Status: DC | PRN
  Administered 2019-12-06: 5 mL

## 2019-12-06 MED ORDER — LIDOCAINE-EPINEPHRINE 1 %-1:100000 IJ SOLN
INTRAMUSCULAR | Status: AC
  Filled 2019-12-06: qty 1

## 2019-12-06 MED ORDER — ALBUTEROL SULFATE HFA 108 (90 BASE) MCG/ACT IN AERS: 1.0000 | INHALATION_SPRAY | RESPIRATORY_TRACT | Status: DC | PRN

## 2019-12-06 MED ORDER — LIDOCAINE HCL (PF) 1 % IJ SOLN
INTRAMUSCULAR | Status: AC
  Filled 2019-12-06: qty 30

## 2019-12-06 MED ORDER — FUROSEMIDE 10 MG/ML IJ SOLN
INTRAMUSCULAR | Status: DC | PRN
  Administered 2019-12-06: 40 mg via INTRAVENOUS

## 2019-12-06 MED ORDER — MIDAZOLAM HCL 2 MG/2ML IJ SOLN
INTRAMUSCULAR | Status: AC
  Filled 2019-12-06: qty 2

## 2019-12-06 MED ORDER — SODIUM CHLORIDE 0.9 % IV SOLN: INTRAVENOUS | Status: AC

## 2019-12-06 MED ORDER — SODIUM CHLORIDE 0.9% FLUSH
3.0000 mL | Freq: Two times a day (BID) | INTRAVENOUS | Status: DC
  Administered 2019-12-06 – 2019-12-07 (×3): 3 mL via INTRAVENOUS

## 2019-12-06 MED ORDER — HEPARIN SODIUM (PORCINE) 5000 UNIT/ML IJ SOLN
4000.0000 [IU] | Freq: Once | INTRAMUSCULAR | Status: AC
  Administered 2019-12-06: 4000 [IU] via INTRAVENOUS

## 2019-12-06 MED ORDER — LIDOCAINE HCL (PF) 1 % IJ SOLN
INTRAMUSCULAR | Status: DC | PRN
  Administered 2019-12-06: 10 mg via INTRADERMAL
  Administered 2019-12-06: 5 mL via INTRADERMAL

## 2019-12-06 MED ORDER — ACETAMINOPHEN 325 MG PO TABS: 650.0000 mg | ORAL_TABLET | ORAL | Status: DC | PRN

## 2019-12-06 MED ORDER — IOHEXOL 350 MG/ML SOLN
INTRAVENOUS | Status: AC
  Filled 2019-12-06: qty 1

## 2019-12-06 MED ORDER — EZETIMIBE 10 MG PO TABS
10.0000 mg | ORAL_TABLET | Freq: Every day | ORAL | Status: DC
  Administered 2019-12-07 – 2019-12-08 (×2): 10 mg via ORAL
  Filled 2019-12-06 (×2): qty 1

## 2019-12-06 MED ORDER — IOHEXOL 350 MG/ML SOLN
INTRAVENOUS | Status: DC | PRN
  Administered 2019-12-06: 160 mL via INTRA_ARTERIAL

## 2019-12-06 MED ORDER — ALLOPURINOL 300 MG PO TABS
300.0000 mg | ORAL_TABLET | Freq: Every day | ORAL | Status: DC
  Administered 2019-12-07 – 2019-12-08 (×2): 300 mg via ORAL
  Filled 2019-12-06 (×2): qty 1

## 2019-12-06 MED ORDER — ALPRAZOLAM 0.25 MG PO TABS: 0.2500 mg | ORAL_TABLET | Freq: Two times a day (BID) | ORAL | Status: DC | PRN

## 2019-12-06 MED ORDER — VERAPAMIL HCL 2.5 MG/ML IV SOLN
INTRAVENOUS | Status: AC
  Filled 2019-12-06: qty 2

## 2019-12-06 MED ORDER — HEPARIN (PORCINE) IN NACL 1000-0.9 UT/500ML-% IV SOLN
INTRAVENOUS | Status: DC | PRN
  Administered 2019-12-06 (×3): 500 mL

## 2019-12-06 MED ORDER — SODIUM CHLORIDE 0.9% FLUSH: 3.0000 mL | INTRAVENOUS | Status: DC | PRN

## 2019-12-06 MED ORDER — MELATONIN 3 MG PO TABS
3.0000 mg | ORAL_TABLET | Freq: Every evening | ORAL | Status: DC | PRN
  Filled 2019-12-06: qty 1

## 2019-12-06 MED ORDER — ATORVASTATIN CALCIUM 80 MG PO TABS
80.0000 mg | ORAL_TABLET | Freq: Every day | ORAL | Status: DC
  Administered 2019-12-06 – 2019-12-07 (×2): 80 mg via ORAL
  Filled 2019-12-06 (×2): qty 1

## 2019-12-06 MED ORDER — LABETALOL HCL 5 MG/ML IV SOLN: 10.0000 mg | INTRAVENOUS | Status: AC | PRN

## 2019-12-06 MED ORDER — SODIUM CHLORIDE 0.9 % IV SOLN: INTRAVENOUS | Status: DC

## 2019-12-06 MED ORDER — VERAPAMIL HCL 2.5 MG/ML IV SOLN
INTRAVENOUS | Status: DC | PRN
  Administered 2019-12-06 (×5): 200 ug via INTRACORONARY

## 2019-12-06 MED ORDER — HEPARIN (PORCINE) IN NACL 1000-0.9 UT/500ML-% IV SOLN
INTRAVENOUS | Status: AC
  Filled 2019-12-06: qty 500

## 2019-12-06 MED ORDER — FENTANYL CITRATE (PF) 100 MCG/2ML IJ SOLN
INTRAMUSCULAR | Status: DC | PRN
  Administered 2019-12-06 (×2): 25 ug via INTRAVENOUS

## 2019-12-06 MED ORDER — ASPIRIN 81 MG PO CHEW
324.0000 mg | CHEWABLE_TABLET | Freq: Once | ORAL | Status: AC
  Administered 2019-12-06: 14:00:00 324 mg via ORAL

## 2019-12-06 MED ORDER — ASPIRIN EC 81 MG PO TBEC
81.0000 mg | DELAYED_RELEASE_TABLET | Freq: Every day | ORAL | Status: DC
  Administered 2019-12-07 – 2019-12-08 (×2): 81 mg via ORAL
  Filled 2019-12-06 (×3): qty 1

## 2019-12-06 MED ORDER — MIDAZOLAM HCL 2 MG/2ML IJ SOLN
INTRAMUSCULAR | Status: DC | PRN
  Administered 2019-12-06: 1 mg via INTRAVENOUS
  Administered 2019-12-06: 2 mg via INTRAVENOUS

## 2019-12-06 MED ORDER — ENOXAPARIN SODIUM 40 MG/0.4ML ~~LOC~~ SOLN
40.0000 mg | SUBCUTANEOUS | Status: DC
  Administered 2019-12-07: 16:00:00 40 mg via SUBCUTANEOUS
  Filled 2019-12-06: qty 0.4

## 2019-12-06 MED ORDER — ALLOPURINOL 100 MG PO TABS: 100.0000 mg | ORAL_TABLET | Freq: Every day | ORAL | Status: DC

## 2019-12-06 MED ORDER — FENTANYL CITRATE (PF) 100 MCG/2ML IJ SOLN
INTRAMUSCULAR | Status: AC
  Filled 2019-12-06: qty 2

## 2019-12-06 MED ORDER — TICAGRELOR 90 MG PO TABS
ORAL_TABLET | ORAL | Status: DC | PRN
  Administered 2019-12-06: 180 mg via ORAL

## 2019-12-06 SURGICAL SUPPLY — 22 items
CABLE ADAPT PACING TEMP 12FT (ADAPTER) ×1 IMPLANT
CATH ANGIOJET SPIRO ULTR 135CM (CATHETERS) ×1 IMPLANT
CATH INFINITI 5FR JL4 (CATHETERS) ×1 IMPLANT
CATH INFINITI JR4 5F (CATHETERS) ×1 IMPLANT
CATH LAUNCHER 6FR AL1 (CATHETERS) IMPLANT
CATH S G BIP PACING (CATHETERS) ×1 IMPLANT
CATHETER LAUNCHER 6FR AL1 (CATHETERS) ×2
CLOSURE MYNX CONTROL 6F/7F (Vascular Products) ×1 IMPLANT
DEVICE CLOSURE PERCLS PRGLD 6F (VASCULAR PRODUCTS) IMPLANT
KIT ENCORE 26 ADVANTAGE (KITS) ×1 IMPLANT
KIT HEART LEFT (KITS) ×2 IMPLANT
PACK CARDIAC CATHETERIZATION (CUSTOM PROCEDURE TRAY) ×2 IMPLANT
PERCLOSE PROGLIDE 6F (VASCULAR PRODUCTS) ×2
SHEATH PINNACLE 6F 10CM (SHEATH) ×2 IMPLANT
SHEATH PROBE COVER 6X72 (BAG) ×2 IMPLANT
SLEEVE REPOSITIONING LENGTH 30 (MISCELLANEOUS) ×1 IMPLANT
STENT RESOLUTE ONYX 3.5X26 (Permanent Stent) ×1 IMPLANT
STENT RESOLUTE ONYX 4.0X26 (Permanent Stent) ×1 IMPLANT
TRANSDUCER W/STOPCOCK (MISCELLANEOUS) ×2 IMPLANT
TUBING CIL FLEX 10 FLL-RA (TUBING) ×2 IMPLANT
WIRE COUGAR XT STRL 190CM (WIRE) ×1 IMPLANT
WIRE EMERALD 3MM-J .035X150CM (WIRE) ×1 IMPLANT

## 2019-12-06 NOTE — H&P (Signed)
Cardiology Admission History and Physical:   Patient ID: Kevin Guzman; MRN: [id number]; DOB: [birth year]   Admission date: 12/06/2019  Primary Care Provider: Bartholome Bill, MD Primary Cardiologist: Sherren Mocha, MD 12/15/2018 Primary Electrophysiologist: None    Chief Complaint:  STEMI  Patient Profile:   Kevin Guzman is a 69 y.o. male with a history of NSTEMI 2008 s/p CABG w/ LIMA-LAD, SVG-Diag, SVG-OM, SVG-RI. HTN, HLD, low testosterone, asthma.   History of Present Illness:   Kevin Guzman was in his usual state of health today he had sudden onset of substernal chest pain.  It was associated with some shortness of breath and diaphoresis.  He denies nausea or vomiting.  It reached a 6/10.  It concerned him, so he got his wife to drive him to the emergency room.  In the emergency room, his ECG was consistent with an inferior STEMI.  He is currently having pain at a 4/10.  He is being taken emergently to the Cath Lab.  This is the first time that he has had this pain in a long time.  He is not very active, but does not routinely get this chest pain with exertion.  He has not had palpitations or presyncope.  He has not had his morning medications.  In the ER, he got aspirin 324 mg and 4000 units of heparin.   Past Medical History:  Diagnosis Date  . Asthma   . CAD (coronary artery disease)    a. 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 EF 65% at cath 8/08  . HLD (hyperlipidemia)   . HTN (hypertension)   . STEMI (ST elevation myocardial infarction) ([place]) 12/06/2019  . Testosterone deficiency     Past Surgical History:  Procedure Laterality Date  . CORONARY ARTERY BYPASS GRAFT  2008   LIMA-LAD, SVG-diagonal, SVG-OM, SVG-ramus  . HERNIA REPAIR       Medications Prior to Admission: Prior to Admission medications   Medication Sig Start Date End Date Taking? Authorizing Provider  albuterol (PROVENTIL HFA;VENTOLIN HFA) 108 (90 Base)  MCG/ACT inhaler Inhale 1 puff into the lungs every 4 (four) hours as needed for wheezing or shortness of breath.    [provider]  albuterol (PROVENTIL) (2.5 MG/3ML) 0.083% nebulizer solution Take 2.5 mg by nebulization every 6 (six) hours as needed for wheezing or shortness of breath.    [provider]  allopurinol (ZYLOPRIM) 100 MG tablet Take 1 tablet (100 mg total) by mouth daily. 07/09/16   Reyne Dumas, MD  amLODipine (NORVASC) 5 MG tablet Take 1 tablet by mouth daily. 12/14/18   [provider]  aspirin 81 MG EC tablet Take 81 mg by mouth daily.      [provider]  atorvastatin (LIPITOR) 80 MG tablet TAKE 1 TABLET (80 MG TOTAL) BY MOUTH DAILY AT 6 PM. 10/28/16   Sherren Mocha, MD  ezetimibe (ZETIA) 10 MG tablet Take 1 tablet (10 mg total) by mouth daily. 02/04/19 01/30/20  Sherren Mocha, MD  hydrochlorothiazide (MICROZIDE) 12.5 MG capsule Take 1 capsule (12.5 mg total) by mouth daily. *Please keep 07/15/17 appt for further refills* 04/30/17   Sherren Mocha, MD  Melatonin 5 MG CAPS Take 5 mg by mouth at bedtime as needed (sleep).     [provider]     Allergies:    Allergies  Allergen Reactions  . Almond Meal Anaphylaxis  . Almond Oil Swelling  . Ace Inhibitors Other (See Comments)  coughing    Social History:   Social History   Socioeconomic History  . Marital status: Married    Spouse name: Not on file  . Number of children: Not on file  . Years of education: Not on file  . Highest education level: Not on file  Occupational History  . Not on file  Tobacco Use  . Smoking status: Former Games developer  . Smokeless tobacco: Former Neurosurgeon    Quit date: 10/13/1998  Substance and Sexual Activity  . Alcohol use: No  . Drug use: No  . Sexual activity: Not on file  Other Topics Concern  . Not on file  Social History Narrative  . Not on file   Social Determinants of Health   Financial Resource Strain:   . Difficulty of Paying  Living Expenses: Not on file  Food Insecurity:   . Worried About Programme researcher, broadcasting/film/video in the Last Year: Not on file  . Ran Out of Food in the Last Year: Not on file  Transportation Needs:   . Lack of Transportation (Medical): Not on file  . Lack of Transportation (Non-Medical): Not on file  Physical Activity:   . Days of Exercise per Week: Not on file  . Minutes of Exercise per Session: Not on file  Stress:   . Feeling of Stress : Not on file  Social Connections:   . Frequency of Communication with Friends and Family: Not on file  . Frequency of Social Gatherings with Friends and Family: Not on file  . Attends Religious Services: Not on file  . Active Member of Clubs or Organizations: Not on file  . Attends Banker Meetings: Not on file  . Marital Status: Not on file  Intimate Partner Violence:   . Fear of Current or Ex-Partner: Not on file  . Emotionally Abused: Not on file  . Physically Abused: Not on file  . Sexually Abused: Not on file    Family History:  The patient's family history includes Cancer in his mother. There is no history of Heart attack or Stroke.   The patient He indicated that his mother is deceased. He indicated that his father is deceased. He indicated that his maternal grandmother is deceased. He indicated that his maternal grandfather is deceased. He indicated that his paternal grandmother is deceased. He indicated that his paternal grandfather is deceased. He indicated that the status of his neg hx is unknown.  ROS:  Please see the history of present illness.  All other ROS reviewed and negative.     Physical Exam/Data:   Vitals:   12/06/19 1352 12/06/19 1400 12/06/19 1441  BP: (!) 89/45    Pulse: (!) 58    Resp: 17    Temp: 97.6 F (36.4 C)    TempSrc: Oral    SpO2: 94%  100%  Weight:  89.8 kg   Height:  5\' 4"  (1.626 m)    No intake or output data in the 24 hours ending 12/06/19 1449 Filed Weights   12/06/19 1400  Weight: 89.8 kg     Body mass index is 33.99 kg/m.  General:  Well nourished, well developed, male in no acute distress HEENT: normal Lymph: no adenopathy Neck:  JVD not elevated Endocrine:  No thryomegaly Vascular: No carotid bruits; 4/4 extremity pulses 2+ bilaterally  Cardiac:  normal S1, S2; RRR; no murmur, no rub or gallop  Lungs: Slight wheeze and few Rales auscultation bilaterally, no rhonchi Abd: soft, nontender, no hepatomegaly  Ext: Trace edema Musculoskeletal:  No deformities, BUE and BLE strength normal and equal Skin: warm and dry  Neuro:  CNs 2-12 intact, no focal abnormalities noted Psych:  Normal affect    EKG:  The ECG was personally reviewed: 2/23 ECG is sinus rhythm, heart rate 73, ST elevation in leads III, aVF, V5, V6.  Reciprocal T wave inversions in I, aVL, V1, V2 Telemetry: Sinus rhythm  Relevant CV Studies:  02/24/2019: MYOVIEW  Nuclear stress EF: 63%.  There was no ST segment deviation noted during stress.  No T wave inversion was noted during stress.  The study is normal.  This is a low risk study.  The left ventricular ejection fraction is normal (55-65%).   CATH: 05/24/2007 The left mainstem tapers distally with an approximate 50% stenosis.  The  left mainstem trifurcates into the LAD, left circumflex and intermediate  branch.  The LAD is a large-caliber vessel that courses down and wraps  around the LV apex.  There is a 95% ostial stenosis of the LAD.  There  is a medium-sized intermediate branch that arises just from the area of  severe stenosis.  Just beyond the area of severe stenosis, there is a  fairly large first diagonal branch that has no significant angiographic  disease.  The remaining portions of the mid distal LAD have no  significant angiographic stenosis.   Left circumflex is large caliber, it courses down and provides a left  PDA branch of as well as left posterolateral branch.  The left  circumflex is dominant.  There is a first OM branch  that has a long  irregular 90% stenosis with poststenotic dilatation.   The right coronary artery is nondominant.  There is no significant  angiographic disease.   The left subclavian artery is widely patent.  The LIMA  is suitable for  grafting.   Left ventricular function is normal.  The LVEF is estimated at 60%.  There is no mitral regurgitation.   ASSESSMENT:  1. Severe two-vessel coronary artery disease with severe ostial LAD      stenosis and severe obtuse marginal stenosis of the left      circumflex.  Also there is mild to moderate distal left mainstem      disease.  2. Left dominant coronary circulation.  3. Normal left ventricular function.   PLAN:  I will resume heparin 4 hours after the sheath is out.  Will hold  clopidogrel.  I reviewed the case with Dr. Donata Clay and think that Mr.  Coker will be best suited with surgical revascularization.  I  discussed this at length with both the patient and his wife.  Laboratory Data:  ChemistryNo results for input(s): NA, K, CL, CO2, GLUCOSE, BUN, CREATININE, CALCIUM, GFRNONAA, GFRAA, ANIONGAP in the last 168 hours.  No results for input(s): PROT, ALBUMIN, AST, ALT, ALKPHOS, BILITOT in the last 168 hours. Hematology Recent Labs  Lab 12/06/19 1410  WBC 8.9  RBC 4.41  HGB 13.3  HCT 40.2  MCV 91.2  MCH 30.2  MCHC 33.1  RDW 13.5  PLT 255   Cardiac Enzymes  High Sensitivity Troponin:  No results for input(s): TROPONINIHS in the last 720 hours.   BNPNo results for input(s): BNP, PROBNP in the last 168 hours.  DDimer No results for input(s): DDIMER in the last 168 hours. Lipids:  Lab Results  Component Value Date   CHOL 159 09/13/2019   HDL 63 09/13/2019   LDLCALC 71 09/13/2019  LDLDIRECT 132.0 06/08/2015   TRIG 145 09/13/2019   CHOLHDL 2.5 09/13/2019   INR:  Lab Results  Component Value Date   INR 1.4 05/27/2007   INR 1.4 05/24/2007   A1c:  Lab Results  Component Value Date   HGBA1C  05/25/2007      5.7 (NOTE)   The ADA recommends the following therapeutic goals for glycemic   control related to Hgb A1C measurement:   Goal of Therapy:   < 7.0% Hgb A1C   Action Suggested:  > 8.0% Hgb A1C   Ref:  Diabetes Care, 22, Suppl. 1, 1999   Thyroid:  Lab Results  Component Value Date   TSH 0.950 **Test methodology is 3rd generation TSH** 05/23/2007    Radiology/Studies:  No results found.  Assessment and Plan:   1.  STEMI: -He is having ongoing chest pain, and his ECG is consistent with an inferior STEMI -He was evaluated by Dr. Herbie Baltimore and by Dr. Excell Seltzer and is being taken emergently to the Cath Lab. -Further evaluation and treatment will depend on the results -Check a TSH, lipid profile, hemoglobin A1c -Continue home medications, but make sure he is on high-dose statin -He has not been on a beta-blocker because of his asthma and has a slight wheeze, will hold off on beta-blocker for now  2.  Hypertension: - he was hypotensive on admission with a systolic blood pressure of 89 -Hypotension improved with IV fluids -Prior to admission he was on amlodipine 5 mg and HCTZ 12.5 mg daily. -Hold both of these for now  3.  Hyperlipidemia, goal LDL less than 70 -Continue high-dose statin and Zetia as at home -Check profile and LFTs  Principal Problem:   STEMI (ST elevation myocardial infarction) [facility]) Active Problems:   Hyperlipemia   Essential hypertension     For questions or updates, please contact [facility] Please consult [email or web address] for contact info under Cardiology/STEMI.    Melida Quitter, PA-C  12/06/2019 2:49 PM

## 2019-12-06 NOTE — ED Provider Notes (Addendum)
Kevin Guzman CATH LAB Provider Note   CSN: [id number] Arrival date & time: 12/06/19  1346     History Chief Complaint  Patient presents with  . Code STEMI    Kevin Guzman is a 69 y.o. male.  69yo M w/ PMH including CAD s/p CABG, HTN, HLD, asthma who p/w chest pain. Around noon today, he had a gradual onset of central, non-radiating chest pain that has eventually become severe, similar to previous MI. Pain associated w/ mild nausea, profuse diaphoresis, no SOB or vomiting. He was in his usual state of health yesterday w/ no recent illness.  He states he did not take his medications this morning and did not have nitroglycerin prior to arrival.  He denies any bleeding problems.  He follows with Dr. Burt Knack for cardiology care.  Of note, pt reports he has been wheezing more recently and has had to use albuterol more frequently than normal.  The history is provided by the patient.       Past Medical History:  Diagnosis Date  . Asthma   . CAD (coronary artery disease)    a. 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 EF 65% at cath 8/08  . HLD (hyperlipidemia)   . HTN (hypertension)   . STEMI (ST elevation myocardial infarction) ([place]) 12/06/2019  . Testosterone deficiency     Patient Active Problem List   Diagnosis Date Noted  . STEMI (ST elevation myocardial infarction) ([place]) 12/06/2019  . Cellulitis of right lower extremity   . Acute gout   . Asthma 07/06/2016  . Cellulitis 07/05/2016  . Asthma exacerbation 01/13/2016  . Acute respiratory failure with hypoxia ([place]) 01/13/2016  . Hypokalemia 01/13/2016  . Viral URI 01/13/2016  . Allergic rhinitis 01/14/2013  . Rash and nonspecific skin eruption 03/01/2012  . Scabies 01/20/2012  . Eczema 01/20/2012  . Coronary atherosclerosis of native coronary artery 08/03/2009  . MASS, CHEST WALL 07/27/2009  . Hyperlipemia 07/24/2008  . Essential hypertension 07/24/2008  . URI  07/24/2008  . Asthma, intermittent with acute exacerbation 07/24/2008    Past Surgical History:  Procedure Laterality Date  . CORONARY ARTERY BYPASS GRAFT  2008   LIMA-LAD, SVG-diagonal, SVG-OM, SVG-ramus  . HERNIA REPAIR         Family History  Problem Relation Age of Onset  . Cancer Mother   . Heart attack Neg Hx   . Stroke Neg Hx     Social History   Tobacco Use  . Smoking status: Former Research scientist (life sciences)  . Smokeless tobacco: Former Systems developer    Quit date: 10/13/1998  Substance Use Topics  . Alcohol use: No  . Drug use: No    Home Medications Prior to Admission medications   Medication Sig Start Date End Date Taking? Authorizing Provider  albuterol (PROVENTIL HFA;VENTOLIN HFA) 108 (90 Base) MCG/ACT inhaler Inhale 1 puff into the lungs every 4 (four) hours as needed for wheezing or shortness of breath.    [provider]  albuterol (PROVENTIL) (2.5 MG/3ML) 0.083% nebulizer solution Take 2.5 mg by nebulization every 6 (six) hours as needed for wheezing or shortness of breath.    [provider]  allopurinol (ZYLOPRIM) 100 MG tablet Take 1 tablet (100 mg total) by mouth daily. 07/09/16   Reyne Dumas, MD  amLODipine (NORVASC) 5 MG tablet Take 1 tablet by mouth daily. 12/14/18   [provider]  aspirin 81 MG EC tablet Take 81 mg by  mouth daily.      [provider]  atorvastatin (LIPITOR) 80 MG tablet TAKE 1 TABLET (80 MG TOTAL) BY MOUTH DAILY AT 6 PM. 10/28/16   Tonny Bollman, MD  ezetimibe (ZETIA) 10 MG tablet Take 1 tablet (10 mg total) by mouth daily. 02/04/19 01/30/20  Tonny Bollman, MD  hydrochlorothiazide (MICROZIDE) 12.5 MG capsule Take 1 capsule (12.5 mg total) by mouth daily. *Please keep 07/15/17 appt for further refills* 04/30/17   Tonny Bollman, MD  Melatonin 5 MG CAPS Take 5 mg by mouth at bedtime as needed (sleep).     [provider]    Allergies    Almond meal, Almond oil, and Ace inhibitors  Review of Systems   Review of  Systems  Unable to perform ROS: Acuity of condition    Physical Exam Updated Vital Signs BP (!) 89/45 (BP Location: Left Arm)   Pulse (!) 58   Temp 97.6 F (36.4 C) (Oral)   Resp 17   Ht 5\' 4"  (1.626 m)   Wt 89.8 kg   SpO2 100%   BMI 33.99 kg/m   Physical Exam Vitals and nursing note reviewed.  Constitutional:      General: He is not in acute distress.    Appearance: He is well-developed.     Comments: Uncomfortable  HENT:     Head: Normocephalic and atraumatic.  Eyes:     Conjunctiva/sclera: Conjunctivae normal.  Cardiovascular:     Rate and Rhythm: Normal rate and regular rhythm.     Heart sounds: Normal heart sounds. No murmur.  Pulmonary:     Effort: Pulmonary effort is normal.     Comments: Occasional wheezes b/l with diminished breath sounds Abdominal:     General: Bowel sounds are normal. There is no distension.     Palpations: Abdomen is soft.     Tenderness: There is no abdominal tenderness.  Musculoskeletal:     Cervical back: Neck supple.  Skin:    General: Skin is warm and dry.     Coloration: Skin is pale.  Neurological:     Mental Status: He is alert and oriented to person, place, and time.     Comments: Fluent speech  Psychiatric:        Judgment: Judgment normal.     ED Results / Procedures / Treatments   Labs (all labs ordered are listed, but only abnormal results are displayed) Labs Reviewed  HEMOGLOBIN A1C - Abnormal; Notable for the following components:      Result Value   Hgb A1c MFr Bld 5.8 (*)    All other components within normal limits  COMPREHENSIVE METABOLIC PANEL - Abnormal; Notable for the following components:   Glucose, Bld 131 (*)    BUN 32 (*)    Calcium 8.8 (*)    All other components within normal limits  CBC WITH DIFFERENTIAL/PLATELET  PROTIME-INR  APTT  LIPID PANEL  TROPONIN I (HIGH SENSITIVITY)    EKG None  Radiology No results found.  Procedures .Critical Care Performed by: ,  MD Authorized by: Laurence Spates, MD   Critical care provider statement:    Critical care time (minutes):  30   Critical care time was exclusive of:  Separately billable procedures and treating other patients   Critical care was necessary to treat or prevent imminent or life-threatening deterioration of the following conditions:  Cardiac failure   Critical care was time spent personally by me on the following activities:  Development  of treatment plan with patient or surrogate, discussions with consultants, examination of patient, obtaining history from patient or surrogate, ordering and performing treatments and interventions, ordering and review of laboratory studies, ordering and review of radiographic studies and re-evaluation of patient's condition   (including critical care time)  Medications Ordered in ED Medications  0.9 %  sodium chloride infusion (has no administration in time range)  aspirin 81 MG chewable tablet (has no administration in time range)  fentaNYL (SUBLIMAZE) injection 100 mcg ( Intravenous MAR Hold 12/06/19 1438)  lidocaine (PF) (XYLOCAINE) 1 % injection (10 mg Intradermal Given 12/06/19 1444)  fentaNYL (SUBLIMAZE) injection (25 mcg Intravenous Given 12/06/19 1444)  midazolam (VERSED) injection (2 mg Intravenous Given 12/06/19 1444)  heparin injection (4,000 Units Intravenous Given 12/06/19 1501)  albuterol (VENTOLIN HFA) 108 (90 Base) MCG/ACT inhaler 1 puff (has no administration in time range)  albuterol (PROVENTIL) (2.5 MG/3ML) 0.083% nebulizer solution 2.5 mg (has no administration in time range)  allopurinol (ZYLOPRIM) tablet 100 mg (has no administration in time range)  aspirin EC tablet 81 mg (has no administration in time range)  atorvastatin (LIPITOR) tablet 80 mg (has no administration in time range)  ezetimibe (ZETIA) tablet 10 mg (has no administration in time range)  Melatonin CAPS 5 mg (has no administration in time range)  verapamil (ISOPTIN)  injection (200 mcg Intracoronary Given 12/06/19 1501)  ticagrelor (BRILINTA) tablet (180 mg Oral Given 12/06/19 1502)  aspirin chewable tablet 324 mg (324 mg Oral Given 12/06/19 1412)  heparin injection 4,000 Units (4,000 Units Intravenous Given 12/06/19 1415)    ED Course  I have reviewed the triage vital signs and the nursing notes.  Pertinent labs & imaging results that were available during my care of the patient were reviewed by me and considered in my medical decision making (see chart for details).    MDM Rules/Calculators/A&P                     Patient was pale and uncomfortable on exam, initial BP low at 89/45. EKG shows inferior STEMI, called code STEMI and spoke w/ Dr. Eldridge Dace. Gave heparin bolus, ASA, fluid bolus which improved pressure. Pt taken emergently to cath lab. Deferred asthma treatment for now in setting of STEMI but I suspect he is having concurrent mild asthma exacerbation.  Final Clinical Impression(s) / ED Diagnoses Final diagnoses:  ST elevation myocardial infarction (STEMI), unspecified artery [facility])    Rx / DC Orders ED Discharge Orders    None       Lian Pounds, Ambrose Finland, MD 12/06/19 1513    Yuliza Cara, Ambrose Finland, MD 12/07/19 [phone]

## 2019-12-06 NOTE — Progress Notes (Signed)
Called to help with right groin hematoma. Large softball size hematoma present at right groin, extending almost to his bladder. Manual pressure applied for 15 minutes, then femostop applied per Dr. Excell Seltzer.   Femostop applied at . bp 105/66. Groin unchanged since femostop applied. Right dp present with doppler.  Femostop applied at 17:25:00

## 2019-12-06 NOTE — Progress Notes (Signed)
CRITICAL VALUE ALERT  Critical Value:  Trop 16, 425  Date & Time Notied:  12/06/19   Provider Notified: expected value

## 2019-12-06 NOTE — Progress Notes (Signed)
Responded to Ed Page to support patient in room 22, code stemi.  Patient is going to cath lab.  Chaplain will follow as needed. Provided support to staff and  Patient.  Venida Jarvis, [place], [facility], Pager [phone]

## 2019-12-06 NOTE — ED Triage Notes (Signed)
Patient c/o central chest pain onset of this morning. States when laying in bed this morning had a severe pain in his right knee and wife witnessed patients head hit the ground and was extremely diaphoretic. Pain in back of neck earlier but pain has subsided. Patient appears pale.

## 2019-12-07 ENCOUNTER — Inpatient Hospital Stay (HOSPITAL_COMMUNITY): Payer: Medicare Other

## 2019-12-07 DIAGNOSIS — I2510 Atherosclerotic heart disease of native coronary artery without angina pectoris: Secondary | ICD-10-CM | POA: Diagnosis present

## 2019-12-07 DIAGNOSIS — I25709 Atherosclerosis of coronary artery bypass graft(s), unspecified, with unspecified angina pectoris: Secondary | ICD-10-CM

## 2019-12-07 DIAGNOSIS — J4521 Mild intermittent asthma with (acute) exacerbation: Secondary | ICD-10-CM

## 2019-12-07 DIAGNOSIS — I2119 ST elevation (STEMI) myocardial infarction involving other coronary artery of inferior wall: Secondary | ICD-10-CM

## 2019-12-07 DIAGNOSIS — I25110 Atherosclerotic heart disease of native coronary artery with unstable angina pectoris: Secondary | ICD-10-CM

## 2019-12-07 HISTORY — DX: Atherosclerosis of coronary artery bypass graft(s), unspecified, with unspecified angina pectoris: I25.709

## 2019-12-07 LAB — CBC
HCT: 40.5 % (ref 39.0–52.0)
Hemoglobin: 13.3 g/dL (ref 13.0–17.0)
MCH: 29.8 pg (ref 26.0–34.0)
MCHC: 32.8 g/dL (ref 30.0–36.0)
MCV: 90.8 fL (ref 80.0–100.0)
Platelets: 289 10*3/uL (ref 150–400)
RBC: 4.46 MIL/uL (ref 4.22–5.81)
RDW: 13.8 % (ref 11.5–15.5)
WBC: 9.5 10*3/uL (ref 4.0–10.5)
nRBC: 0 % (ref 0.0–0.2)

## 2019-12-07 LAB — BASIC METABOLIC PANEL
Anion gap: 11 (ref 5–15)
Anion gap: 13 (ref 5–15)
BUN: 29 mg/dL — ABNORMAL HIGH (ref 8–23)
BUN: 28 mg/dL — ABNORMAL HIGH (ref 8–23)
CO2: 25 mmol/L (ref 22–32)
CO2: 26 mmol/L (ref 22–32)
Calcium: 8.6 mg/dL — ABNORMAL LOW (ref 8.9–10.3)
Calcium: 8.6 mg/dL — ABNORMAL LOW (ref 8.9–10.3)
Chloride: 102 mmol/L (ref 98–111)
Chloride: 102 mmol/L (ref 98–111)
Creatinine, Ser: 1.17 mg/dL (ref 0.61–1.24)
Creatinine, Ser: 1.32 mg/dL — ABNORMAL HIGH (ref 0.61–1.24)
GFR calc Af Amer: >60 mL/min (ref >60)
GFR calc Af Amer: >60 mL/min (ref >60)
GFR calc non Af Amer: >60 mL/min (ref >60)
GFR calc non Af Amer: 55 mL/min — ABNORMAL LOW (ref >60)
Glucose, Bld: 109 mg/dL — ABNORMAL HIGH (ref 70–99)
Glucose, Bld: 108 mg/dL — ABNORMAL HIGH (ref 70–99)
Potassium: 6.1 mmol/L — ABNORMAL HIGH (ref 3.5–5.1)
Potassium: 3.4 mmol/L — ABNORMAL LOW (ref 3.5–5.1)
Sodium: 138 mmol/L (ref 135–145)
Sodium: 141 mmol/L (ref 135–145)

## 2019-12-07 LAB — ECHOCARDIOGRAM COMPLETE
Height: 64 in
Weight: 3168 oz

## 2019-12-07 LAB — LIPID PANEL
Cholesterol: 161 mg/dL (ref 0–200)
HDL: 51 mg/dL (ref >40)
LDL Cholesterol: 87 mg/dL (ref 0–99)
Total CHOL/HDL Ratio: 3.2 RATIO
Triglycerides: 113 mg/dL (ref <150)
VLDL: 23 mg/dL (ref 0–40)

## 2019-12-07 MED ORDER — FUROSEMIDE 20 MG PO TABS
20.0000 mg | ORAL_TABLET | Freq: Once | ORAL | Status: AC
  Administered 2019-12-07: 10:00:00 20 mg via ORAL
  Filled 2019-12-07: qty 1

## 2019-12-07 MED ORDER — ORAL CARE MOUTH RINSE
15.0000 mL | Freq: Two times a day (BID) | OROMUCOSAL | Status: DC
  Administered 2019-12-07: 15 mL via OROMUCOSAL

## 2019-12-07 MED ORDER — FLUTICASONE FUROATE-VILANTEROL 100-25 MCG/INH IN AEPB
1.0000 | INHALATION_SPRAY | Freq: Every day | RESPIRATORY_TRACT | Status: DC
  Administered 2019-12-07 – 2019-12-08 (×2): 1 via RESPIRATORY_TRACT
  Filled 2019-12-07: qty 28

## 2019-12-07 MED ORDER — POTASSIUM CHLORIDE CRYS ER 20 MEQ PO TBCR
40.0000 meq | EXTENDED_RELEASE_TABLET | Freq: Once | ORAL | Status: AC
  Administered 2019-12-07: 40 meq via ORAL
  Filled 2019-12-07: qty 2

## 2019-12-07 MED ORDER — BISOPROLOL FUMARATE 5 MG PO TABS
2.5000 mg | ORAL_TABLET | Freq: Every day | ORAL | Status: DC
  Administered 2019-12-07 – 2019-12-08 (×2): 2.5 mg via ORAL
  Filled 2019-12-07 (×2): qty 1

## 2019-12-07 MED FILL — Heparin Sodium (Porcine) Inj 1000 Unit/ML: INTRAMUSCULAR | Qty: 10 | Status: AC

## 2019-12-07 NOTE — Care Management (Signed)
CM submitted benefit check for prasugrel.  Results will be documented in Epic once completed and filed in notes  under TOC service.

## 2019-12-07 NOTE — Progress Notes (Addendum)
Progress Note  Patient Name: Kevin Guzman Date of Encounter: 12/07/2019  Primary Cardiologist: Tonny Bollman, MD   Patient profile   69 year old male with past medical history of CAD and CABG 2008, HTN, HLD, asthma who presented to emergency room with sudden onset substernal chest pain, dyspnea and diaphoresis.  Initial EKG showed ST elevations in leads II, III and aVF.  He was started on IV heparin, received aspirin and emergently sent to Cath Lab and underwent PCI and stent placement in SVG-OM. Of note, he had high burden thrombosis that intervened with AngioJet during heart cath.  Subjective   Patient was seen and evaluated at bedside this morning.  Is feeling well, denies any chest pain.  He had some shortness of breath post cath that now improved.  He denies any dizziness, palpitation and is willing to try to sit on the chair and ambulate.  He is aware that he had a hematoma formation at his right groin where the heart cath performed.  He denies any pain or bleeding at the area. Overnight telemetry review showed sinus rhythm with NSVT. No acute event reported overnight.  Inpatient Medications    Scheduled Meds: . allopurinol  300 mg Oral Daily  . aspirin EC  81 mg Oral Daily  . atorvastatin  80 mg Oral q1800  . Chlorhexidine Gluconate Cloth  6 each Topical Daily  . enoxaparin (LOVENOX) injection  40 mg Subcutaneous Q24H  . ezetimibe  10 mg Oral Daily  . fentaNYL (SUBLIMAZE) injection  100 mcg Intravenous Once  . mouth rinse  15 mL Mouth Rinse BID  . prasugrel  10 mg Oral Daily  . sodium chloride flush  3 mL Intravenous Q12H  . sodium chloride flush  3 mL Intravenous Q12H   Continuous Infusions: . sodium chloride Stopped (12/06/19 1738)  . sodium chloride    . sodium chloride     PRN Meds: sodium chloride, sodium chloride, acetaminophen, albuterol, ALPRAZolam, Melatonin, nitroGLYCERIN, ondansetron (ZOFRAN) IV, sodium chloride flush, sodium chloride flush, zolpidem   Vital Signs    Vitals:   12/07/19 0000 12/07/19 0100 12/07/19 0200 12/07/19 0400  BP: 103/62 (!) 107/55 110/89   Pulse: 77 75 74   Resp: 17 17 17    Temp: 98.2 F (36.8 C)   98.3 F (36.8 C)  TempSrc: Oral   Oral  SpO2: 100% 100% 100%   Weight:      Height:        Intake/Output Summary (Last 24 hours) at 12/07/2019 0716 Last data filed at 12/07/2019 0000 Gross per 24 hour  Intake 440.49 ml  Output 1750 ml  Net -1309.51 ml   Last 3 Weights 12/06/2019 02/24/2019 12/15/2018  Weight (lbs) 198 lb 196 lb 196 lb 3.2 oz  Weight (kg) 89.812 kg 88.905 kg 88.996 kg      Telemetry    Sinus with NSVT - Personally Reviewed  ECG    Sinus, ST depression in V2-V4 and improved ST elevation in inferior leads- Personally Reviewed  Physical Exam   GEN: No acute distress.   Neck: No JVD Cardiac: RRR, no murmurs, rubs, or gallops.  Respiratory: mild to moderate diffuse expiratory wheezing, no respiratory distress GI: Soft, nontender, non-distended  MS: Bruise at right inguinal area, no acute hamatoma, no bleeding, no tenderness, Trace LE pitting edema; No deformity. Neuro:  Nonfocal  Psych: Normal affect   Labs    High Sensitivity Troponin:   Recent Labs  Lab 12/06/19 1410 12/06/19 1834 12/06/19 2143  TROPONINIHS 7 16,435* 19,283*      Chemistry Recent Labs  Lab 12/06/19 1410 12/07/19 0242  NA 139 138  K 4.1 6.1*  CL 104 102  CO2 22 25  GLUCOSE 131* 109*  BUN 32* 29*  CREATININE 1.20 1.17  CALCIUM 8.8* 8.6*  PROT 6.5  --   ALBUMIN 3.9  --   AST 23  --   ALT 24  --   ALKPHOS 98  --   BILITOT 0.7  --   GFRNONAA >60 >60  GFRAA >60 >60  ANIONGAP 13 11     Hematology Recent Labs  Lab 12/06/19 1410 12/07/19 0242  WBC 8.9 9.5  RBC 4.41 4.46  HGB 13.3 13.3  HCT 40.2 40.5  MCV 91.2 90.8  MCH 30.2 29.8  MCHC 33.1 32.8  RDW 13.5 13.8  PLT 255 289    BNPNo results for input(s): BNP, PROBNP in the last 168 hours.   DDimer No results for input(s): DDIMER in  the last 168 hours.   Radiology    CARDIAC CATHETERIZATION  Result Date: 12/06/2019 1. Severe native vessel CAD involving a left dominant coronary system with:  Total occlusion of the LAD at the ostium  Moderate distal left main/ostial LCx stenosis  Total occlusion of the first OM branch of the circumflex  Moderate disease of a small nondominant RCA 2. Normal LV systolic function with LVEF estimated at 65% 3. Severely elevated LVEDP, treated with IV lasix 40 mg in the cath lab, indicative of acute diastolic heart failure in the setting of AMI 4. Successful SVG-OM PCI using angiojet thrombectomy and stenting Plan: Aggressive medical therapy, post-MI care. Stop ticagrelor as patient markedly short of breath after receiving loading dose, suggestive of ticagrelor induced shortness of breath. Load with effient tonight and start 10 mg daily tomorrow morning. No beta-blocker in setting of asthma.   DG Chest Port 1 View  Result Date: 12/06/2019 CLINICAL DATA:  69 year old male with STEMI. EXAM: PORTABLE CHEST 1 VIEW COMPARISON:  Chest radiograph dated 09/13/2018. FINDINGS: No focal consolidation, pleural effusion, pneumothorax. The cardiac silhouette is within normal limits. Median sternotomy wires and CABG vascular clips. No acute osseous pathology. IMPRESSION: 1. No acute cardiopulmonary process. 2. Postsurgical changes of prior CABG. Electronically Signed   By: Elgie Collard M.D.   On: 12/06/2019 17:25    Cardiac Studies   Left heart cath: 12/06/2019  1. Severe native vessel CAD involving a left dominant coronary system with:  Total occlusion of the LAD at the ostium  Moderate distal left main/ostial LCx stenosis  Total occlusion of the first OM branch of the circumflex  Moderate disease of a small nondominant RCA 2. Normal LV systolic function with LVEF estimated at 65% 3. Severely elevated LVEDP, treated with IV lasix 40 mg in the cath lab, indicative of acute diastolic heart failure  in the setting of AMI 4. Successful SVG-OM PCI using angiojet thrombectomy and stenting  Plan: Aggressive medical therapy, post-MI care. Stop ticagrelor as patient markedly short of breath after receiving loading dose, suggestive of ticagrelor induced shortness of breath. Load with effient tonight and start 10 mg daily tomorrow morning. No beta-blocker in setting of asthma.   Diagnostic Dominance: Left  Intervention    2D echo: EF 60 to 65%.  Normal function with normal wall motion.  GR 1 DD.  Normal R AN LA.  No significant valvular disease.   Patient Profile     69 y.o. male with past medical history of  CAD and CABG 2008, HTN, HLD, asthma who presented to emergency room with sudden onset substernal chest pain, dyspnea and diaphoresis.  Initial EKG showed ST elevations in leads II, III and aVF.  He was started on IV heparin, received aspirin and emergently sent to Cath Lab  and underwent PCI and stent placement in SVG-OM. Of note, he had high burden thrombosis that intervened with AngioJet during heart cath.  Assessment & Plan   Principal Problem:   Acute ST elevation myocardial infarction (STEMI) of inferior wall (HCC) Active Problems:   Coronary artery disease involving native coronary artery of native heart with unstable angina pectoris (HCC)   Coronary artery disease involving coronary bypass graft of native heart with angina pectoris (HCC)   Hyperlipemia   Essential hypertension   Asthma, intermittent with acute exacerbation  Acute inferior STEMI: S/p heart cath 12/06/2019 and successful PCI and 2 stents placement in SVG OM CAD and s/p CABG 2008 HLD HTN   Successful SVG-OM PCI and 2 stents placement and angiojet thrombectomy  No chest pain and no further shortness of breath. He had some hematoma formation at right inguinal area (rt femoral artery catheter insertion site).  Stable.  No bleeding.  No acute hematoma present currently.  Distal pulses are normal.  Vital sign  and hemoglobin stable.  Telemetry shows sinus rhythm with NSVT: We will start low-dose beta-blocker  LDL: 87, HDL 51.  Hemoglobin A1c: 5.8  -Continue aspirin and Effient (patient had some shortness of breath post cath that could be due to Brilinta so it switched to Effient) -Continue Lipitor 80 mg daily and home Zetia 10 mg daily -Starting bisoprolol 2.5 mg daily (given history of asthma) -No ACE or ARB due to history of allergy soft blood pressure and -Follow-up echo results -Up to chair and ambulate patient. If tolerates, transfer to cardiac telemetry floor  Acute diastolic HF in setting of acute MI: Severely elevated LVEDP during the cath EF estimated 65% during cath   Treated with 40 mg IV Lasix in cath lab. SOB improved. (could also be due to Brilinta vs asthma)  Has some trace bilateral LEE. Otherwise nl volume status.  -Will give IV lasix 20 mg once today and will follow echo result  -Hold off of home HCTZ for today -Echo result is pending -Strict I/Os -Weight daily -Potassium today 6.1 (yesterday potassium was 4.5, and we are not certain if today's value is correct or hemolyzed, patient received IV Lasix yesterday and we expect drop in potassium instead of hyperkalemia).  Recheck potassium stat   HLD:  -Continue home Lipitor and Zetia   HTN:  On amlodipine and HCTZ at home BP stable at 100s-120s/60s.  -Holding home meds and starting him on low-dose bisoprolol post STEMI  Asthma: No shortness of breath currently but patient has some wheezing on exam. -Albuterol inhaler as needed -Continue home Breo Ellipta inhaler -Avoiding non selevtive beta blocker  For questions or updates, please contact [facility] Please consult [email or web address] for contact info under        Signed, Chevis Pretty, MD  12/07/2019, 7:16 AM     ATTENDING ATTESTATION  I have seen, examined and evaluated the patient this AM along with Dr. Maryla Morrow, (Resident).  After reviewing all  the available data and chart, we discussed the patients laboratory, study & physical findings as well as symptoms in detail. I agree with her findings, examination as well as impression recommendations as per our discussion.    Attending adjustments noted in  italics.  Holger looks and feels much better than he did yesterday.  No further chest pain.  The only thing he notes is little bit of wheezing.  This is per his report basely his baseline.  Continue Breo along with albuterol inhaler. For his mild crackles/rales on exam, we will give 1 dose of Lasix today for mild potential rales -however his echo results are normal, as I do not anticipate him needing long time diuretic.  Start low-dose bisoprolol along with other medications continued.    Glenetta Hew, M.D., M.S. Interventional Cardiologist   Pager # [phone] Phone # [phone] [address] [place], [place] [address]

## 2019-12-07 NOTE — Progress Notes (Signed)
CARDIAC REHAB PHASE I   PRE:  Rate/Rhythm: 73 SR  BP:  Sitting: 99/53      SaO2: 100 RA  MODE:  Ambulation: 370 ft   POST:  Rate/Rhythm: 89 SR  BP:  Sitting: 88/75    SaO2: 98 RA   Pt ambulated 324ft in hallway standby assist with steady gait. Pt denies dizziness, pain or SOB. Pt and wife educated on importance of ASA, and Effient. Pt given MI book, and heart healthy diet. Will refer to CRP II [place]. Will f/u tomorrow and complete education with pt as he is moving rooms shortly.  [id number] Reynold Bowen, RN BSN 12/07/2019 2:16 PM

## 2019-12-07 NOTE — TOC Benefit Eligibility Note (Signed)
Transition of Care [facility]) Benefit Eligibility Note    Patient Details  Name: Kevin Guzman MRN: [id number] Date of Birth: December 26, 1950   Medication/Dose: PRASUGREL   10 MG DAILY  Covered?: Yes  Tier: (TIER- 4 DRUG)  Prescription Coverage Preferred Pharmacy: [facility]  Spoke with Person/Company/Phone Number:: ALISIA  @  [employer] # [phone]  Co-Pay: $ 46.52  Prior Approval: No  Deductible: [place] Phone Number: 12/07/2019, 11:14 AM

## 2019-12-07 NOTE — TOC Benefit Eligibility Note (Signed)
Transition of Care [facility]) Benefit Eligibility Note    Patient Details  Name: Kevin Guzman MRN: [id number] Date of Birth: [birth year]   Medication/Dose: PRASUGREL   10 MG DAILY  Covered?: Yes  Tier: (TIER- 4 DRUG)  Prescription Coverage Preferred Pharmacy: [facility]  Spoke with Person/Company/Phone Number:: ALISIA  @  [employer] # [phone]  Co-Pay: $ 46.52     Deductible: Danley Danker Phone Number: 12/07/2019, 11:13 AM

## 2019-12-07 NOTE — Progress Notes (Signed)
  Echocardiogram 2D Echocardiogram has been performed.  Dorena Dew Dowell Hoon 12/07/2019, 9:48 AM

## 2019-12-08 DIAGNOSIS — Z955 Presence of coronary angioplasty implant and graft: Secondary | ICD-10-CM

## 2019-12-08 LAB — BASIC METABOLIC PANEL WITH GFR
Anion gap: 10 (ref 5–15)
BUN: 30 mg/dL — ABNORMAL HIGH (ref 8–23)
CO2: 26 mmol/L (ref 22–32)
Calcium: 8.2 mg/dL — ABNORMAL LOW (ref 8.9–10.3)
Chloride: 104 mmol/L (ref 98–111)
Creatinine, Ser: 1.37 mg/dL — ABNORMAL HIGH (ref 0.61–1.24)
GFR calc Af Amer: >60 mL/min
GFR calc non Af Amer: 53 mL/min — ABNORMAL LOW
Glucose, Bld: 97 mg/dL (ref 70–99)
Potassium: 3.8 mmol/L (ref 3.5–5.1)
Sodium: 140 mmol/L (ref 135–145)

## 2019-12-08 MED ORDER — NITROGLYCERIN 0.4 MG SL SUBL: 0.4000 mg | SUBLINGUAL_TABLET | SUBLINGUAL | 2 refills | Status: AC | PRN

## 2019-12-08 MED ORDER — PRASUGREL HCL 10 MG PO TABS: 10.0000 mg | ORAL_TABLET | Freq: Every day | ORAL | 2 refills | Status: DC

## 2019-12-08 MED ORDER — BISOPROLOL FUMARATE 5 MG PO TABS: 2.5000 mg | ORAL_TABLET | Freq: Every day | ORAL | 1 refills | Status: DC

## 2019-12-08 MED FILL — NITROGLYCERIN 0.4 MG TAB SL: 0.4 | 8 days supply | Qty: 25 | Fill #0

## 2019-12-08 MED FILL — BISOPROLOL FUMARATE 5 MG TA: 5 | 90 days supply | Qty: 45 | Fill #0

## 2019-12-08 MED FILL — PRASUGREL HCL 10 MG TABS: 10 | 30 days supply | Qty: 30 | Fill #0

## 2019-12-08 NOTE — Progress Notes (Signed)
CARDIAC REHAB PHASE I   PRE:  Rate/Rhythm: 73 SR  BP:  Sitting: 94/57      SaO2: 97 RA  MODE:  Ambulation: 400 ft   POST:  Rate/Rhythm: 87 SR  BP:  Sitting: 119/63    SaO2: 95 RA  Pt ambulated 410ft independently without difficulty. Pt denies CP, SOB, or dizziness. Pt educated on importance of ASA, Effient, NTG, and statin. Pt given MI book, along with heart healthy diet. Reviewed restrictions, site care, and exercise guidelines. Will refer to CRP II [place]. Pt wanting to participate in person.  [id number] Reynold Bowen, RN BSN 12/08/2019 8:49 AM

## 2019-12-08 NOTE — Discharge Summary (Signed)
Discharge Summary    Patient ID: Kevin Guzman,  MRN: [id number], DOB/AGE: 12-29-1950 69 y.o.  Admit date: 12/06/2019 Discharge date: 12/08/2019  Primary Care Provider: Verlon Guzman, Kevin Guzman Primary Cardiologist: Kevin BollmanMichael Cooper, MD  Discharge Diagnoses    Principal Problem:   Acute ST elevation myocardial infarction (STEMI) of inferior wall [facility](HCC) Active Problems:   Hyperlipemia   Essential hypertension   Coronary artery disease involving native coronary artery of native heart with unstable angina pectoris (HCC)   Asthma, intermittent with acute exacerbation   Coronary artery disease involving coronary bypass graft of native heart with angina pectoris (HCC)   Allergies Allergies  Allergen Reactions  . Almond Meal Anaphylaxis and Swelling  . Almond Oil Anaphylaxis and Swelling  . Ace Inhibitors Cough    Diagnostic Studies/Procedures    Cath: 12/06/19  1. Severe native vessel CAD involving a left dominant coronary system with:  Total occlusion of the LAD at the ostium  Moderate distal left main/ostial LCx stenosis  Total occlusion of the first OM branch of the circumflex  Moderate disease of a small nondominant RCA 2. Normal LV systolic function with LVEF estimated at 65% 3. Severely elevated LVEDP, treated with IV lasix 40 mg in the cath lab, indicative of acute diastolic heart failure in the setting of AMI 4. Successful SVG-OM PCI using angiojet thrombectomy and stenting  Plan: Aggressive medical therapy, post-MI care. Stop ticagrelor as patient markedly short of breath after receiving loading dose, suggestive of ticagrelor induced shortness of breath. Load with effient tonight and start 10 mg daily tomorrow morning. No beta-blocker in setting of asthma.   Diagnostic Dominance: Left  Intervention    TTE: 12/07/19  IMPRESSIONS    1. Left ventricular ejection fraction, by estimation, is 60 to 65%. The  left ventricle has normal function. The left  ventricle has no regional  wall motion abnormalities. Left ventricular diastolic parameters are  consistent with Grade I diastolic  dysfunction (impaired relaxation).  2. Right ventricular systolic function is normal. The right ventricular  size is normal. Tricuspid regurgitation signal is inadequate for assessing  PA pressure.  3. The mitral valve is grossly normal. Trivial mitral valve  regurgitation. No evidence of mitral stenosis.  4. The aortic valve is tricuspid. Aortic valve regurgitation is not  visualized. Mild aortic valve sclerosis is present, with no evidence of  aortic valve stenosis.  5. The inferior vena cava is normal in size with greater than 50%  respiratory variability, suggesting right atrial pressure of 3 mmHg.  _____________   History of Present Illness     Mr. 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 HTN, HLD, low testosterone, asthma. He was in his usual state of health until the day of admission when he developed a sudden onset of substernal chest pain.  It was associated with some shortness of breath and diaphoresis.  He denied nausea or vomiting.  It reached a 6/10.  It concerned him, so he got his wife to drive him to the emergency room.  In the emergency room, his ECG was consistent with an inferior STEMI.  He was having pain at a 4/10.  He was taken emergently to the Cath Lab. He got aspirin 324 mg and 4000 units of heparin.  Hospital Course     Consultants: None   Underwent cardiac cath noted above with severe native vessel CAD with patent LIMA-LAD, and SVG-Diag. Occluded SVG-OM occluded  with successful PCI/DES x2. He was placed on DAPT with ASA/Effient post cath as he c/o shortness of breath with Brilinta. hsTn peaked at 19[address]. LDL noted at 87, continued on high dose statin, and Zetia 10mg  daily. Also started on low dose Bisoprolol 2.5mg  daily. Was on ARB prior to admission but blood pressures  remained soft. Will plan to continue to hold ARB and HCTZ at the time of discharge. Consider restarting at outpatient appt. EF noted at 60-65% with G1DD, but no rWMA. Did have mild crackles post cath which improved with a dose of IV lasix. Worked well with cardiac rehab without recurrent chest pain.   General: Well developed, well nourished, male appearing in no acute distress. Head: Normocephalic, atraumatic.  Neck: Supple without bruits, JVD. Lungs:  Resp regular and unlabored, CTA. Heart: RRR, S1, S2, no S3, S4, or murmur; no rub. Abdomen: Soft, non-tender, non-distended with normoactive bowel sounds. No hepatomegaly. No rebound/guarding. No obvious abdominal masses. Extremities: No clubbing, cyanosis, edema. Distal pedal pulses are 2+ bilaterally. Right femoral cath site stable without bruising or hematoma Neuro: Alert and oriented X 3. Moves all extremities spontaneously. Psych: Normal affect.  Kevin Guzman was seen by Dr. Kyla Guzman and determined stable for discharge home. Follow up in the office has been arranged. Medications are listed below.   _____________  Discharge Vitals Blood pressure (!) 114/53, pulse 70, temperature 98.5 F (36.9 C), temperature source Oral, resp. rate 18, height 5\' 4"  (1.626 m), weight 89.4 kg, SpO2 94 %.  Filed Weights   12/06/19 1400 12/08/19 0604  Weight: 89.8 kg 89.4 kg    Labs & Radiologic Studies    CBC Recent Labs    12/06/19 1410 12/07/19 0242  WBC 8.9 9.5  NEUTROABS 6.4  --   HGB 13.3 13.3  HCT 40.2 40.5  MCV 91.2 90.8  PLT 255 289   Basic Metabolic Panel Recent Labs    12/08/19 1035 12/08/19 0422  NA 141 140  K 3.4* 3.8  CL 102 104  CO2 26 26  GLUCOSE 108* 97  BUN 28* 30*  CREATININE 1.32* 1.37*  CALCIUM 8.6* 8.2*   Liver Function Tests Recent Labs    12/06/19 1410  AST 23  ALT 24  ALKPHOS 98  BILITOT 0.7  PROT 6.5  ALBUMIN 3.9   No results for input(s): LIPASE, AMYLASE in the last 72 hours. Cardiac  Enzymes No results for input(s): CKTOTAL, CKMB, CKMBINDEX, TROPONINI in the last 72 hours. BNP Invalid input(s): POCBNP D-Dimer No results for input(s): DDIMER in the last 72 hours. Hemoglobin A1C Recent Labs    12/06/19 1410  HGBA1C 5.8*   Fasting Lipid Panel Recent Labs    12/07/19 0242  CHOL 161  HDL 51  LDLCALC 87  TRIG 113  CHOLHDL 3.2   Thyroid Function Tests Recent Labs    12/06/19 1834  TSH 2.756   _____________  CARDIAC CATHETERIZATION  Result Date: 12/06/2019 1. Severe native vessel CAD involving a left dominant coronary system with:  Total occlusion of the LAD at the ostium  Moderate distal left main/ostial LCx stenosis  Total occlusion of the first OM branch of the circumflex  Moderate disease of a small nondominant RCA 2. Normal LV systolic function with LVEF estimated at 65% 3. Severely elevated LVEDP, treated with IV lasix 40 mg in the cath lab, indicative of acute diastolic heart failure in the setting of AMI 4. Successful SVG-OM PCI using angiojet thrombectomy and stenting Plan: Aggressive medical therapy, post-MI care.  Stop ticagrelor as patient markedly short of breath after receiving loading dose, suggestive of ticagrelor induced shortness of breath. Load with effient tonight and start 10 mg daily tomorrow morning. No beta-blocker in setting of asthma.   DG Chest Port 1 View  Result Date: 12/06/2019 CLINICAL DATA:  69 year old male with STEMI. EXAM: PORTABLE CHEST 1 VIEW COMPARISON:  Chest radiograph dated 09/13/2018. FINDINGS: No focal consolidation, pleural effusion, pneumothorax. The cardiac silhouette is within normal limits. Median sternotomy wires and CABG vascular clips. No acute osseous pathology. IMPRESSION: 1. No acute cardiopulmonary process. 2. Postsurgical changes of prior CABG. Electronically Signed   By: Elgie Collard M.D.   On: 12/06/2019 17:25   ECHOCARDIOGRAM COMPLETE  Result Date: 12/07/2019    ECHOCARDIOGRAM REPORT   Patient Name:    Kevin Guzman Date of Exam: 12/07/2019 Medical Rec #:  [id number]         Height:       64.0 in Accession #:    [id number]        Weight:       198.0 lb Date of Birth:  23-Nov-1950        BSA:          1.948 m Patient Age:    68 years          BP:           97/84 mmHg Patient Gender: M                 HR:           78 bpm. Exam Location:  Inpatient Procedure: 2D Echo, Cardiac Doppler and Color Doppler Indications:    R07.9* Chest pain, unspecified  History:        Patient has no prior history of Echocardiogram examinations. CAD                 and Acute MI; Risk Factors:Hypertension and Dyslipidemia.  Sonographer:    Elmarie Shiley Dance Referring Phys: [id number] MICHAEL Guzman IMPRESSIONS  1. Left ventricular ejection fraction, by estimation, is 60 to 65%. The left ventricle has normal function. The left ventricle has no regional wall motion abnormalities. Left ventricular diastolic parameters are consistent with Grade I diastolic dysfunction (impaired relaxation).  2. Right ventricular systolic function is normal. The right ventricular size is normal. Tricuspid regurgitation signal is inadequate for assessing PA pressure.  3. The mitral valve is grossly normal. Trivial mitral valve regurgitation. No evidence of mitral stenosis.  4. The aortic valve is tricuspid. Aortic valve regurgitation is not visualized. Mild aortic valve sclerosis is present, with no evidence of aortic valve stenosis.  5. The inferior vena cava is normal in size with greater than 50% respiratory variability, suggesting right atrial pressure of 3 mmHg. Comparison(s): No prior Echocardiogram. FINDINGS  Left Ventricle: Left ventricular ejection fraction, by estimation, is 60 to 65%. The left ventricle has normal function. The left ventricle has no regional wall motion abnormalities. The left ventricular internal cavity size was normal in size. There is  no left ventricular hypertrophy. Left ventricular diastolic parameters are consistent with Grade I  diastolic dysfunction (impaired relaxation). Normal left ventricular filling pressure. Right Ventricle: The right ventricular size is normal. No increase in right ventricular wall thickness. Right ventricular systolic function is normal. Tricuspid regurgitation signal is inadequate for assessing PA pressure. Left Atrium: Left atrial size was normal in size. Right Atrium: Right atrial size was normal in size. Pericardium: There is no evidence of pericardial effusion. Presence  of pericardial fat pad. Mitral Valve: The mitral valve is grossly normal. Trivial mitral valve regurgitation. No evidence of mitral valve stenosis. Tricuspid Valve: The tricuspid valve is grossly normal. Tricuspid valve regurgitation is trivial. Aortic Valve: The aortic valve is tricuspid. . There is moderate thickening and moderate calcification of the aortic valve. Aortic valve regurgitation is not visualized. Mild aortic valve sclerosis is present, with no evidence of aortic valve stenosis. There is moderate thickening of the aortic valve. There is moderate calcification of the aortic valve. Pulmonic Valve: The pulmonic valve was grossly normal. Pulmonic valve regurgitation is not visualized. No evidence of pulmonic stenosis. Aorta: The aortic root is normal in size and structure. Venous: The inferior vena cava is normal in size with greater than 50% respiratory variability, suggesting right atrial pressure of 3 mmHg. IAS/Shunts: The interatrial septum appears to be lipomatous. No atrial level shunt detected by color flow Doppler.  LEFT VENTRICLE PLAX 2D LVIDd:         4.02 cm  Diastology LVIDs:         2.92 cm  LV e' lateral:   9.14 cm/s LV PW:         1.13 cm  LV E/e' lateral: 6.6 LV IVS:        1.09 cm  LV e' medial:    8.27 cm/s LVOT diam:     1.90 cm  LV E/e' medial:  7.3 LV SV:         54 LV SV Index:   28 LVOT Area:     2.84 cm  RIGHT VENTRICLE            IVC RV Basal diam:  2.60 cm    IVC diam: 1.41 cm RV S prime:     8.38 cm/s TAPSE  (M-mode): 1.5 cm LEFT ATRIUM             Index       RIGHT ATRIUM           Index LA diam:        3.40 cm 1.75 cm/m  RA Area:     11.50 cm LA Vol (A2C):   28.8 ml 14.78 ml/m RA Volume:   24.90 ml  12.78 ml/m LA Vol (A4C):   33.7 ml 17.30 ml/m LA Biplane Vol: 31.5 ml 16.17 ml/m  AORTIC VALVE LVOT Vmax:   75.40 cm/s LVOT Vmean:  54.400 cm/s LVOT VTI:    0.189 m  AORTA Ao Root diam: 3.30 cm Ao Asc diam:  3.20 cm MITRAL VALVE MV Area (PHT): 2.80 cm    SHUNTS MV Decel Time: 271 msec    Systemic VTI:  0.19 m MV E velocity: 60.60 cm/s  Systemic Diam: 1.90 cm MV A velocity: 84.40 cm/s MV E/A ratio:  0.72 Eleonore Chiquito MD Electronically signed by Eleonore Chiquito MD Signature Date/Time: 12/07/2019/12:01:29 PM    Final    Disposition   Pt is being discharged home today in good condition.  Follow-up Plans & Appointments    Follow-up Information    Liliane Shi, PA-C Follow up on 12/21/2019.   Specialties: Cardiology, Physician Assistant Why: at 2:15pm for your follow up appt.  Contact information: [address] [address] [phone]          Discharge Instructions    Amb Referral to Cardiac Rehabilitation   Complete by: As directed    Diagnosis:  Coronary Stents STEMI     After initial evaluation  and assessments completed: Virtual Based Care may be provided alone or in conjunction with Phase 2 Cardiac Rehab based on patient barriers.: Yes   Call MD for:  redness, tenderness, or signs of infection (pain, swelling, redness, odor or green/yellow discharge around incision site)   Complete by: As directed    Diet - low sodium heart healthy   Complete by: As directed    Discharge instructions   Complete by: As directed    Groin Site Care Refer to this sheet in the next few weeks. These instructions provide you with information on caring for yourself after your procedure. Your caregiver may also give you more specific instructions. Your treatment has been planned  according to current medical practices, but problems sometimes occur. Call your caregiver if you have any problems or questions after your procedure. HOME CARE INSTRUCTIONS You may shower 24 hours after the procedure. Remove the bandage (dressing) and gently wash the site with plain soap and water. Gently pat the site dry.  Do not apply powder or lotion to the site.  Do not sit in a bathtub, swimming pool, or whirlpool for 5 to 7 days.  No bending, squatting, or lifting anything over 10 pounds (4.5 kg) as directed by your caregiver.  Inspect the site at least twice daily.  Do not drive home if you are discharged the same day of the procedure. Have someone else drive you.  You may drive 24 hours after the procedure unless otherwise instructed by your caregiver.  What to expect: Any bruising will usually fade within 1 to 2 weeks.  Blood that collects in the tissue (hematoma) may be painful to the touch. It should usually decrease in size and tenderness within 1 to 2 weeks.  SEEK IMMEDIATE MEDICAL CARE IF: You have unusual pain at the groin site or down the affected leg.  You have redness, warmth, swelling, or pain at the groin site.  You have drainage (other than a small amount of blood on the dressing).  You have chills.  You have a fever or persistent symptoms for more than 72 hours.  You have a fever and your symptoms suddenly get worse.  Your leg becomes pale, cool, tingly, or numb.  You have heavy bleeding from the site. Hold pressure on the site. Marland Kitchen  PLEASE DO NOT MISS ANY DOSES OF YOUR EFFIENT!!!!! Also keep a log of you blood pressures and bring back to your follow up appt. Please call the office with any questions.   Patients taking blood thinners should generally stay away from medicines like ibuprofen, Advil, Motrin, naproxen, and Aleve due to risk of stomach bleeding. You may take Tylenol as directed or talk to your primary doctor about alternatives.   Increase activity slowly    Complete by: As directed        Discharge Medications     Medication List    STOP taking these medications   candesartan 16 MG tablet Commonly known as: ATACAND   hydrochlorothiazide 12.5 MG capsule Commonly known as: MICROZIDE     TAKE these medications   albuterol (2.5 MG/3ML) 0.083% nebulizer solution Commonly known as: PROVENTIL Take 2.5 mg by nebulization every 6 (six) hours as needed for wheezing or shortness of breath.   albuterol 108 (90 Base) MCG/ACT inhaler Commonly known as: VENTOLIN HFA Inhale 2 puffs into the lungs every 4 (four) hours as needed for wheezing or shortness of breath.   allopurinol 300 MG tablet Commonly known as: ZYLOPRIM Take 300  mg by mouth daily.   aspirin 81 MG EC tablet Take 81 mg by mouth daily.   atorvastatin 80 MG tablet Commonly known as: LIPITOR TAKE 1 TABLET (80 MG TOTAL) BY MOUTH DAILY AT 6 PM. What changed: See the new instructions.   bisoprolol 5 MG tablet Commonly known as: ZEBETA Take 0.5 tablets (2.5 mg total) by mouth daily.   Breo Ellipta 100-25 MCG/INH Aepb Generic drug: fluticasone furoate-vilanterol Inhale 1 puff into the lungs daily.   ezetimibe 10 MG tablet Commonly known as: ZETIA Take 1 tablet (10 mg total) by mouth daily.   fexofenadine 180 MG tablet Commonly known as: ALLEGRA Take 180 mg by mouth daily as needed for allergies or rhinitis.   Melatonin 10 MG Caps Take 10 mg by mouth at bedtime as needed (for sleep).   nitroGLYCERIN 0.4 MG SL tablet Commonly known as: NITROSTAT Place 1 tablet (0.4 mg total) under the tongue every 5 (five) minutes x 3 doses as needed for chest pain.   prasugrel 10 MG Tabs tablet Commonly known as: EFFIENT Take 1 tablet (10 mg total) by mouth daily.       Yes                               AHA/ACC Clinical Performance & Quality Measures: 1. Aspirin prescribed? - Yes 2. ADP Receptor Inhibitor (Plavix/Clopidogrel, Brilinta/Ticagrelor or Effient/Prasugrel)  prescribed (includes medically managed patients)? - Yes 3. Beta Blocker prescribed? - Yes 4. High Intensity Statin (Lipitor 40-80mg  or Crestor 20-40mg ) prescribed? - Yes 5. EF assessed during THIS hospitalization? - Yes 6. For EF <40%, was ACEI/ARB prescribed? - Not Applicable (EF >/= 40%) 7. For EF <40%, Aldosterone Antagonist (Spironolactone or Eplerenone) prescribed? - Not Applicable (EF >/= 40%) 8. Cardiac Rehab Phase II ordered (Included Medically managed Patients)? - Yes     Outstanding Labs/Studies   Consider restarting home ARB at outpatient follow up if blood pressures tolerate.   Duration of Discharge Encounter   Greater than 30 minutes including physician time.  Signed, Laverda Page NP-C 12/08/2019, 9:55 AM

## 2019-12-13 ENCOUNTER — Encounter (HOSPITAL_COMMUNITY): Payer: Self-pay | Admitting: *Deleted

## 2019-12-13 NOTE — Progress Notes (Signed)
Referral for Cardiac rehab received and verified for MD signature.  Follow up appt is 12/21/19. Insurance benefits and eligibility to be determined. Pt will be contacted and scheduled after the satisfactory completion of this pt follow up appt. Alanson Aly, BSN Cardiac and Emergency planning/management officer

## 2019-12-15 ENCOUNTER — Telehealth (HOSPITAL_COMMUNITY): Payer: Self-pay

## 2019-12-15 NOTE — Telephone Encounter (Signed)
Pt insurance is active and benefits verified through Medicare a/b Co-pay 0, DED $203/$203 met, out of pocket 0/0 met, co-insurance 20%. no pre-authorization required. Passport, 12/15/2019_0 :40pm, REF# [id number]  Will contact patient to see if he is interested in the Cardiac Rehab Program. If interested, patient will need to complete follow up appt. Once completed, patient will be contacted for scheduling upon review by the RN Navigator.  2ndary insurance is active and benefits verified through [employer]. Co-pay 0, DED 0/0 met, out of pocket 0/0 met, co-insurance 0. No pre-authorization required.

## 2019-12-20 NOTE — Progress Notes (Signed)
Cardiology Office Note:    Date:  12/21/2019   ID:  Kevin Guzman, DOB 04-Nov-1950, MRN [id number]  PCP:  Verlon Au, MD  Cardiologist:  Tonny Bollman, MD   Electrophysiologist:  None   Referring MD: Verlon Au, MD   Chief Complaint:  Hospitalization Follow-up (s/p MI >> PCI to S-OM)    Patient Profile:    Kevin Guzman is a 69 y.o. male with:   Coronary artery disease   S/p NSTEMI in 2008 >> CABG  S/p inf STEMI 11/2019>>PCI:  DES x 2 to S-OM  Hypertension   Hyperlipidemia    Prior CV studies: Echocardiogram 12/07/2019 EF 60-65, Gr 1 DD, normal RVSF, trivial MR  Cardiac catheterization 12/06/2019 LM mid 50 LAD ost 100 OM1 100 RCA prox 50 L-LAD ok S-D1 ok S-OM 80, mid 100 EF 55-65 PCI: 3.5 x 26 mm Resolute Onyx DES, 4 x 26 mm Resolute Onyx DES to S-OM  Myoview 02/24/2019 EF 63, Low Risk  History of Present Illness:    Kevin Guzman was last seen in clinic by Dr. Excell Seltzer in 12/2018. He was admitted 2/23-2/25 with an inferior STEMI c/b acute diastolic CHF.  Cath showed an occluded S-OM and this was tx with DES x 2.  He was given Lasix IV in the cath lab.  His ARB was held at DC due to low BP.  He returns for follow-up.  He is here alone.  Since discharge, he has not had any chest discomfort or significant shortness of breath.  He will occasionally feel as though he needs to take a deep breath.  He has not had orthopnea or PND.  He has not had syncope.  He has felt tired.  His right groin have been swollen and painful.  This is slowly improving.  He also notes improving ecchymosis.  Past Medical History:  Diagnosis Date  . Acute ST elevation myocardial infarction (STEMI) of inferior wall (HCC) 12/06/2019  . Asthma   . CAD (coronary artery disease)    a. 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 EF 65% at cath 8/08  . Coronary artery disease involving coronary bypass graft of native heart with angina pectoris [facility])  12/07/2019   12/06/2019: Inferior STEMI-thrombotic occlusion of SVG-OM treated with rheolytic (AngioJet) thrombectomy and 2 site DES PCI.  Marland Kitchen HLD (hyperlipidemia)   . HTN (hypertension)   . STEMI (ST elevation myocardial infarction) (HCC) 12/06/2019  . Testosterone deficiency     Current Medications: Current Meds  Medication Sig  . albuterol (PROVENTIL HFA;VENTOLIN HFA) 108 (90 Base) MCG/ACT inhaler Inhale 2 puffs into the lungs every 4 (four) hours as needed for wheezing or shortness of breath.   Marland Kitchen albuterol (PROVENTIL) (2.5 MG/3ML) 0.083% nebulizer solution Take 2.5 mg by nebulization every 6 (six) hours as needed for wheezing or shortness of breath.  . allopurinol (ZYLOPRIM) 300 MG tablet Take 300 mg by mouth daily.  Marland Kitchen aspirin 81 MG EC tablet Take 81 mg by mouth daily.    Marland Kitchen atorvastatin (LIPITOR) 80 MG tablet TAKE 1 TABLET (80 MG TOTAL) BY MOUTH DAILY AT 6 PM.  . ezetimibe (ZETIA) 10 MG tablet Take 1 tablet (10 mg total) by mouth daily.  . fexofenadine (ALLEGRA) 180 MG tablet Take 180 mg by mouth daily as needed for allergies or rhinitis.  . fluticasone furoate-vilanterol (BREO ELLIPTA) 100-25 MCG/INH AEPB Inhale 1 puff into the lungs daily.  . Melatonin 10 MG CAPS Take 10  mg by mouth at bedtime as needed (for sleep).  . nitroGLYCERIN (NITROSTAT) 0.4 MG SL tablet Place 1 tablet (0.4 mg total) under the tongue every 5 (five) minutes x 3 doses as needed for chest pain.  . prasugrel (EFFIENT) 10 MG TABS tablet Take 1 tablet (10 mg total) by mouth daily.  . [DISCONTINUED] bisoprolol (ZEBETA) 5 MG tablet Take 0.5 tablets (2.5 mg total) by mouth daily.     Allergies:   Almond meal, Almond oil, and Ace inhibitors   Social History   Tobacco Use  . Smoking status: Former Research scientist (life sciences)  . Smokeless tobacco: Former Systems developer    Quit date: 10/13/1998  Substance Use Topics  . Alcohol use: No  . Drug use: No     Family Hx: The patient's family history includes Cancer in his mother. There is no history of  Heart attack or Stroke.  ROS   EKGs/Labs/Other Test Reviewed:    EKG:  EKG is   ordered today.  The ekg ordered today demonstrates normal sinus rhythm, heart rate 60, left axis deviation, incomplete right bundle branch block, QTC 438, no change since last EKG  Recent Labs: 12/06/2019: ALT 24; TSH 2.756 12/07/2019: Hemoglobin 13.3; Platelets 289 12/08/2019: BUN 30; Creatinine, Ser 1.37; Potassium 3.8; Sodium 140   Recent Lipid Panel Lab Results  Component Value Date/Time   CHOL 161 12/07/2019 02:42 AM   CHOL 159 09/13/2019 02:18 PM   TRIG 113 12/07/2019 02:42 AM   TRIG 199 (H) 08/18/2006 03:07 PM   HDL 51 12/07/2019 02:42 AM   HDL 63 09/13/2019 02:18 PM   CHOLHDL 3.2 12/07/2019 02:42 AM   LDLCALC 87 12/07/2019 02:42 AM   LDLCALC 71 09/13/2019 02:18 PM   LDLDIRECT 132.0 06/08/2015 05:32 PM    Physical Exam:    VS:  BP 90/60   Pulse 64   Ht 5\' 4"  (1.626 m)   Wt 200 lb 1.9 oz (90.8 kg)   SpO2 94%   BMI 34.35 kg/m     Wt Readings from Last 3 Encounters:  12/21/19 200 lb 1.9 oz (90.8 kg)  12/08/19 197 lb 3.2 oz (89.4 kg)  02/24/19 196 lb (88.9 kg)     Constitutional:      Appearance: Healthy appearance. Not in distress.  Neck:     Thyroid: Thyroid normal.     Vascular: JVD normal.  Pulmonary:     Breath sounds: No wheezing. No rales.  Cardiovascular:     Normal rate. Regular rhythm. Normal S1. Normal S2.     Murmurs: There is no murmur.  Pulses:    Comments: R FA site without hematoma or bruit Edema:    Peripheral edema absent.  Abdominal:     Palpations: Abdomen is soft. There is no hepatomegaly.  Skin:    General: Skin is warm and dry.     Comments: Scattered ecchymosis about right groin  Neurological:     General: No focal deficit present.     Mental Status: Alert and oriented to person, place and time.     Cranial Nerves: Cranial nerves are intact.       ASSESSMENT & PLAN:    1. Acute ST elevation myocardial infarction (STEMI) of inferior wall  (HCC) Status post inferior ST elevation myocardial infarction treated with a drug-eluting stent x2 to the vein graft to the obtuse marginal.  LIMA-LAD and vein graft to the diagonal were both patent.  EF was 60-65% with grade 1 diastolic dysfunction by echocardiogram.  He  did require IV Lasix in the catheterization lab secondary to acute CHF.  He is currently doing well with normal volume status.  His blood pressure has been running low.  He is minimally symptomatic.  I will try to adjust his bisoprolol to metoprolol succinate to see if he can tolerate this and continue on beta-blocker therapy post MI.  -Continue aspirin, Prasugrel, Atorvastatin, Ezetimibe, beta-blocker  -I have advised him to pursue cardiac rehab  -Follow-up in 6 to 8 weeks  2. Hypotension, unspecified hypotension type He is mildly symptomatic with low blood pressure.  He was previously on amlodipine and HCTZ.  I will switch his bisoprolol to metoprolol succinate as noted above.  Obtain BMP, CBC today.  I will provide him with a blood pressure cuff.  Of asked him to contact us if his blood pressure continues to run less than 100 systolic.  3. Mixed hyperlipidemia Continue high intensity statin therapy along with ezetimibe.   Dispo:  Return in about 8 weeks (around 02/15/2020) for Routine Follow Up, w/ Dr. Excell Seltzer, or Tereso Newcomer, PA-C, in person.   Medication Adjustments/Labs and Tests Ordered: Current medicines are reviewed at length with the patient today.  Concerns regarding medicines are outlined above.  Tests Ordered: Orders Placed This Encounter  Procedures  . Basic metabolic panel  . CBC  . EKG 12-Lead   Medication Changes: Meds ordered this encounter  Medications  . metoprolol succinate (TOPROL-XL) 25 MG 24 hr tablet    Sig: Take 1/2 tablet (12.5 mg total) by mouth once a day    Dispense:  90 tablet    Refill:  3    D/C bisoprolol    Signed, Tereso Newcomer, PA-C  12/21/2019 3:16 PM    [facility] [address] [place], [place], [place]  [address] Phone: [phone]; Fax: [phone]

## 2019-12-21 ENCOUNTER — Encounter: Payer: Self-pay | Admitting: Physician Assistant

## 2019-12-21 ENCOUNTER — Ambulatory Visit (INDEPENDENT_AMBULATORY_CARE_PROVIDER_SITE_OTHER): Payer: Medicare Other | Admitting: Physician Assistant

## 2019-12-21 ENCOUNTER — Other Ambulatory Visit: Payer: Self-pay

## 2019-12-21 VITALS — BP 90/60 | HR 64 | Ht 64.0 in | Wt 200.1 lb

## 2019-12-21 DIAGNOSIS — E782 Mixed hyperlipidemia: Secondary | ICD-10-CM | POA: Diagnosis not present

## 2019-12-21 DIAGNOSIS — I959 Hypotension, unspecified: Secondary | ICD-10-CM | POA: Diagnosis not present

## 2019-12-21 DIAGNOSIS — I2119 ST elevation (STEMI) myocardial infarction involving other coronary artery of inferior wall: Secondary | ICD-10-CM | POA: Diagnosis not present

## 2019-12-21 MED ORDER — METOPROLOL SUCCINATE ER 25 MG PO TB24: ORAL_TABLET | ORAL | 3 refills | Status: DC

## 2019-12-21 NOTE — Patient Instructions (Addendum)
Medication Instructions:  Your physician has recommended you make the following change in your medication:   1. STOP: bisoprolol  2. START: metoprolol succinate (Toprol-XL) 25 mg tablet: Take 1/2 tablet (12.5 mg total) by mouth once a day  *If you need a refill on your cardiac medications before your next appointment, please call your pharmacy*   Lab Work: TODAY: CBC, BMET  If you have labs (blood work) drawn today and your tests are completely normal, you will receive your results only by: Marland Kitchen MyChart Message (if you have MyChart) OR . A paper copy in the mail If you have any lab test that is abnormal or we need to change your treatment, we will call you to review the results.   Testing/Procedures: None ordered   Follow-Up: Follow up with Tereso Newcomer, PA on 4/21 at 2:45 PM  Other Instructions Call if your Systolic Blood Pressure (Top Number) is less than 100

## 2019-12-23 ENCOUNTER — Telehealth: Payer: Self-pay | Admitting: Physician Assistant

## 2019-12-23 MED ORDER — BISOPROLOL FUMARATE 5 MG PO TABS: 2.5000 mg | ORAL_TABLET | Freq: Every day | ORAL | 3 refills | Status: DC

## 2019-12-23 NOTE — Telephone Encounter (Signed)
Pt c/o medication issue:  1. Name of Medication: bisoprolol (ZEBETA) 5 MG tablet   2. How are you currently taking this medication (dosage and times per day)? Hasn't taken today   3. Are you having a reaction (difficulty breathing--STAT)? No   4. What is your medication issue? Hy is calling stating he came to the realization that when he was taking bisoprolol he was taking double the dosage he was advised to without realizing. He is calling wanting to know if he should try taking bisoprolol again with the correct dosage or continue to no longer take the medication and continue taking metoprolol succinate (TOPROL-XL) 25 MG 24 hr tablet instead. He is requesting Lorin Picket give him a callback in regards to this. Please advise.

## 2019-12-23 NOTE — Telephone Encounter (Signed)
NA- and No VM.  Will continue to attempt to contact pt.

## 2019-12-23 NOTE — Telephone Encounter (Signed)
Pt saw Kevin Guzman 3/10.  Appears he reporting taking Bisoprolol 5 mg 1/2 tablet daily but c/o low BP.  Kevin Guzman d/ced Bisoprolol and started pt on Metoprolol Succinate 25 mg 1/2 tablet daily.  No pt is calling back because he had been taking twice as much Bisoprolol as ordered without realizing it.  He would like to know if he should continue taking the Bisoprolol at the correct dose or change to Metoprolol as instructed yesterday.  Attempted to contact pt to discuss however NA and his voicemail is full.  Will continue to attempt to contact him.

## 2019-12-23 NOTE — Telephone Encounter (Signed)
He can try to decrease the Bisoprolol to 2.5 mg once daily. Keep track of BP. Call if systolic remains < 100. Tereso Newcomer, PA-C    12/23/2019 3:30 PM

## 2019-12-23 NOTE — Telephone Encounter (Signed)
Spoke with patient who is aware  To take Bisoprolol 2.5 mg daily and call if systolic BP < 100.

## 2019-12-26 LAB — CBC
Hematocrit: 40 % (ref 37.5–51.0)
Hemoglobin: 13.1 g/dL (ref 13.0–17.7)
MCH: 29.9 pg (ref 26.6–33.0)
MCHC: 32.8 g/dL (ref 31.5–35.7)
MCV: 91 fL (ref 79–97)
Platelets: 340 10*3/uL (ref 150–450)
RBC: 4.38 x10E6/uL (ref 4.14–5.80)
RDW: 13.2 % (ref 11.6–15.4)
WBC: 6.4 10*3/uL (ref 3.4–10.8)

## 2019-12-26 LAB — BASIC METABOLIC PANEL
BUN/Creatinine Ratio: 16 (ref 10–24)
BUN: 19 mg/dL (ref 8–27)
CO2: 24 mmol/L (ref 20–29)
Calcium: 8.9 mg/dL (ref 8.6–10.2)
Chloride: 105 mmol/L (ref 96–106)
Creatinine, Ser: 1.22 mg/dL (ref 0.76–1.27)
GFR calc Af Amer: 70 mL/min/{1.73_m2} (ref >59)
GFR calc non Af Amer: 61 mL/min/{1.73_m2} (ref >59)
Glucose: 89 mg/dL (ref 65–99)
Potassium: 4.8 mmol/L (ref 3.5–5.2)
Sodium: 142 mmol/L (ref 134–144)

## 2019-12-27 NOTE — Telephone Encounter (Signed)
Called patient to see if he was interested in participating in the Cardiac Rehab Program. Patient stated yes. Patient will come in for orientation on 01/05/2020@1 :30pm and will attend the 3:00pm exercise class.  Mailed homework package.

## 2020-01-02 ENCOUNTER — Telehealth (HOSPITAL_COMMUNITY): Payer: Self-pay | Admitting: Pharmacist

## 2020-01-04 ENCOUNTER — Other Ambulatory Visit: Payer: Self-pay

## 2020-01-04 ENCOUNTER — Encounter (HOSPITAL_COMMUNITY)
Admission: RE | Admit: 2020-01-04 | Discharge: 2020-01-04 | Disposition: A | Discharge status: Home / Self Care | Payer: Medicare Other | Source: Ambulatory Visit | Attending: Cardiovascular Disease | Admitting: Cardiovascular Disease

## 2020-01-04 DIAGNOSIS — Z955 Presence of coronary angioplasty implant and graft: Secondary | ICD-10-CM

## 2020-01-04 DIAGNOSIS — I2119 ST elevation (STEMI) myocardial infarction involving other coronary artery of inferior wall: Secondary | ICD-10-CM | POA: Insufficient documentation

## 2020-01-04 NOTE — Progress Notes (Signed)
PC to pt regarding cardiac rehab orientation and to perform health history.  Pt confirmed appt for 01/05/2020 at 1330 and health history completed.  Explained requirements for mask and shoes with closed toe and back.  Verbalized understanding.

## 2020-01-05 ENCOUNTER — Other Ambulatory Visit: Payer: Self-pay

## 2020-01-05 ENCOUNTER — Encounter (HOSPITAL_COMMUNITY)
Admission: RE | Admit: 2020-01-05 | Discharge: 2020-01-05 | Disposition: A | Discharge status: Home / Self Care | Payer: Medicare Other | Source: Ambulatory Visit | Attending: Cardiovascular Disease | Admitting: Cardiovascular Disease

## 2020-01-05 ENCOUNTER — Encounter (HOSPITAL_COMMUNITY): Payer: Self-pay

## 2020-01-05 VITALS — BP 104/60 | HR 82 | Temp 97.0°F | Ht 63.3 in | Wt 200.6 lb

## 2020-01-05 DIAGNOSIS — Z955 Presence of coronary angioplasty implant and graft: Secondary | ICD-10-CM

## 2020-01-05 DIAGNOSIS — I2119 ST elevation (STEMI) myocardial infarction involving other coronary artery of inferior wall: Secondary | ICD-10-CM

## 2020-01-05 NOTE — Progress Notes (Signed)
Cardiac Individual Treatment Plan  Patient Details  Name: Kevin Guzman MRN: [id number] Date of Birth: 1951-05-24 Referring Provider:     CARDIAC REHAB PHASE II ORIENTATION from 01/05/2020 in [name] CARDIAC REHAB  Referring Provider  Tonny Bollman MD      Initial Encounter Date:    CARDIAC REHAB PHASE II ORIENTATION from 01/05/2020 in [facility] CARDIAC REHAB  Date  01/05/20      Visit Diagnosis: Acute ST elevation myocardial infarction (STEMI) of inferior wall (HCC)  S/P DES GRAFT X 2 12/06/19  Patient's Home Medications on Admission:  Current Outpatient Medications:  .  albuterol (PROVENTIL HFA;VENTOLIN HFA) 108 (90 Base) MCG/ACT inhaler, Inhale 2 puffs into the lungs every 4 (four) hours as needed for wheezing or shortness of breath. , Disp: , Rfl:  .  albuterol (PROVENTIL) (2.5 MG/3ML) 0.083% nebulizer solution, Take 2.5 mg by nebulization every 6 (six) hours as needed for wheezing or shortness of breath., Disp: , Rfl:  .  allopurinol (ZYLOPRIM) 300 MG tablet, Take 300 mg by mouth daily., Disp: , Rfl:  .  aspirin 81 MG EC tablet, Take 81 mg by mouth daily.  , Disp: , Rfl:  .  atorvastatin (LIPITOR) 80 MG tablet, TAKE 1 TABLET (80 MG TOTAL) BY MOUTH DAILY AT 6 PM., Disp: 90 tablet, Rfl: 1 .  bisoprolol (ZEBETA) 5 MG tablet, Take 0.5 tablets (2.5 mg total) by mouth daily., Disp: 45 tablet, Rfl: 3 .  ezetimibe (ZETIA) 10 MG tablet, Take 1 tablet (10 mg total) by mouth daily., Disp: 90 tablet, Rfl: 3 .  fexofenadine (ALLEGRA) 180 MG tablet, Take 180 mg by mouth daily as needed for allergies or rhinitis., Disp: , Rfl:  .  fluticasone furoate-vilanterol (BREO ELLIPTA) 100-25 MCG/INH AEPB, Inhale 1 puff into the lungs daily., Disp: , Rfl:  .  Melatonin 10 MG CAPS, Take 10 mg by mouth at bedtime as needed (for sleep)., Disp: , Rfl:  .  nitroGLYCERIN (NITROSTAT) 0.4 MG SL tablet, Place 1 tablet (0.4 mg total) under the tongue every 5 (five)  minutes x 3 doses as needed for chest pain., Disp: 25 tablet, Rfl: 2 .  prasugrel (EFFIENT) 10 MG TABS tablet, Take 1 tablet (10 mg total) by mouth daily., Disp: 90 tablet, Rfl: 2  Past Medical History: Past Medical History:  Diagnosis Date  . Acute ST elevation myocardial infarction (STEMI) of inferior wall (HCC) 12/06/2019  . Asthma   . CAD (coronary artery disease)    a. 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 EF 65% at cath 8/08  . Coronary artery disease involving coronary bypass graft of native heart with angina pectoris [facility]) 12/07/2019   12/06/2019: Inferior STEMI-thrombotic occlusion of SVG-OM treated with rheolytic (AngioJet) thrombectomy and 2 site DES PCI.  Marland Kitchen HLD (hyperlipidemia)   . HTN (hypertension)   . STEMI (ST elevation myocardial infarction) (HCC) 12/06/2019  . Testosterone deficiency     Tobacco Use: Social History   Tobacco Use  Smoking Status Former Smoker  Smokeless Tobacco Former Neurosurgeon  . Quit date: 10/13/1998    Labs: Recent Review Flowsheet Data    Labs for ITP Cardiac and Pulmonary Rehab Latest Ref Rng & Units 01/27/2019 05/23/2019 09/13/2019 12/06/2019 12/07/2019   Cholestrol 0 - 200 mg/dL 646 803 212 248 250   LDLCALC 0 - 99 mg/dL 037(C) 99 71 86 87   LDLDIRECT mg/dL - - - - -  HDL >40 mg/dL 55 48 63 51 51   Trlycerides <150 mg/dL 135 159(H) 145 132 113   Hemoglobin A1c 4.8 - 5.6 % - - - 5.8(H) -      Capillary Blood Glucose: No results found for: GLUCAP   Exercise Target Goals: Exercise Program Goal: Individual exercise prescription set using results from initial 6 min walk test and THRR while considering  patient's activity barriers and safety.   Exercise Prescription Goal: Starting with aerobic activity 30 plus minutes a day, 3 days per week for initial exercise prescription. Provide home exercise prescription and guidelines that participant acknowledges understanding prior to discharge.  Activity Barriers & Risk  Stratification: Activity Barriers & Cardiac Risk Stratification - 01/05/20 1553      Activity Barriers & Cardiac Risk Stratification   Activity Barriers  Joint Problems    Cardiac Risk Stratification  High       6 Minute Walk: 6 Minute Walk    Row Name 01/05/20 1539         6 Minute Walk   Phase  Initial     Distance  1261 feet     Walk Time  6 minutes     # of Rest Breaks  0     MPH  2.4     METS  2.4     RPE  12     Perceived Dyspnea   0     VO2 Peak  8.3     Symptoms  No     Resting HR  82 bpm     Resting BP  104/60     Resting Oxygen Saturation   95 %     Exercise Oxygen Saturation  during 6 min walk  95 %     Max Ex. HR  93 bpm     Max Ex. BP  120/70     2 Minute Post BP  110/60        Oxygen Initial Assessment:   Oxygen Re-Evaluation:   Oxygen Discharge (Final Oxygen Re-Evaluation):   Initial Exercise Prescription: Initial Exercise Prescription - 01/05/20 1600      Date of Initial Exercise RX and Referring Provider   Date  01/05/20    Referring Provider  Sherren Mocha MD    Expected Discharge Date  03/16/20      Treadmill   MPH  1.6    Grade  1    Minutes  15    METs  2.4      NuStep   Level  2    SPM  85    Minutes  15    METs  2.2      Prescription Details   Frequency (times per week)  3x    Duration  Progress to 30 minutes of continuous aerobic without signs/symptoms of physical distress      Intensity   THRR 40-80% of Max Heartrate  61-122    Ratings of Perceived Exertion  11-13    Perceived Dyspnea  0-4      Progression   Progression  Continue progressive overload as per policy without signs/symptoms or physical distress.      Resistance Training   Training Prescription  Yes    Weight  3lbs    Reps  10-15       Perform Capillary Blood Glucose checks as needed.  Exercise Prescription Changes:   Exercise Comments:   Exercise Goals and Review:  Exercise Goals    Row Name 01/05/20  1550             Exercise  Goals   Increase Physical Activity  Yes       Intervention  Provide advice, education, support and counseling about physical activity/exercise needs.;Develop an individualized exercise prescription for aerobic and resistive training based on initial evaluation findings, risk stratification, comorbidities and participant's personal goals.       Expected Outcomes  Short Term: Attend rehab on a regular basis to increase amount of physical activity.;Long Term: Add in home exercise to make exercise part of routine and to increase amount of physical activity.;Long Term: Exercising regularly at least 3-5 days a week.       Increase Strength and Stamina  Yes       Intervention  Provide advice, education, support and counseling about physical activity/exercise needs.;Develop an individualized exercise prescription for aerobic and resistive training based on initial evaluation findings, risk stratification, comorbidities and participant's personal goals.       Expected Outcomes  Short Term: Increase workloads from initial exercise prescription for resistance, speed, and METs.;Short Term: Perform resistance training exercises routinely during rehab and add in resistance training at home;Long Term: Improve cardiorespiratory fitness, muscular endurance and strength as measured by increased METs and functional capacity ( )       Able to understand and use rate of perceived exertion (RPE) scale  Yes       Intervention  Provide education and explanation on how to use RPE scale       Expected Outcomes  Short Term: Able to use RPE daily in rehab to express subjective intensity level;Long Term:  Able to use RPE to guide intensity level when exercising independently       Knowledge and understanding of Target Heart Rate Range (THRR)  Yes       Intervention  Provide education and explanation of THRR including how the numbers were predicted and where they are located for reference       Expected Outcomes  Short Term: Able  to state/look up THRR;Long Term: Able to use THRR to govern intensity when exercising independently;Short Term: Able to use daily as guideline for intensity in rehab       Able to check pulse independently  Yes       Intervention  Provide education and demonstration on how to check pulse in carotid and radial arteries.;Review the importance of being able to check your own pulse for safety during independent exercise       Expected Outcomes  Short Term: Able to explain why pulse checking is important during independent exercise;Long Term: Able to check pulse independently and accurately       Understanding of Exercise Prescription  Yes       Intervention  Provide education, explanation, and written materials on patient's individual exercise prescription       Expected Outcomes  Short Term: Able to explain program exercise prescription;Long Term: Able to explain home exercise prescription to exercise independently          Exercise Goals Re-Evaluation :    Discharge Exercise Prescription (Final Exercise Prescription Changes):   Nutrition:  Target Goals: Understanding of nutrition guidelines, daily intake of sodium 1500mg , cholesterol 200mg , calories 30% from fat and 7% or less from saturated fats, daily to have 5 or more servings of fruits and vegetables.  Biometrics: Pre Biometrics - 01/05/20 1549      Pre Biometrics   Height  5' 3.3" (1.608 m)    Weight  91 kg    Waist Circumference  44 inches    Hip Circumference  42 inches    Waist to Hip Ratio  1.05 %    BMI (Calculated)  35.19    Triceps Skinfold  15 mm    % Body Fat  32.9 %    Grip Strength  40 kg    Flexibility  20 in    Single Leg Stand  30 seconds        Nutrition Therapy Plan and Nutrition Goals:   Nutrition Assessments:   Nutrition Goals Re-Evaluation:   Nutrition Goals Discharge (Final Nutrition Goals Re-Evaluation):   Psychosocial: Target Goals: Acknowledge presence or absence of significant  depression and/or stress, maximize coping skills, provide positive support system. Participant is able to verbalize types and ability to use techniques and skills needed for reducing stress and depression.  Initial Review & Psychosocial Screening: Initial Psych Review & Screening - 01/05/20 1534      Initial Review   Current issues with  None Identified      Family Dynamics   Good Support System?  Yes   [name] has his wife for support     Barriers   Psychosocial barriers to participate in program  There are no identifiable barriers or psychosocial needs.      Screening Interventions   Interventions  Encouraged to exercise       Quality of Life Scores: Quality of Life - 01/05/20 1555      Quality of Life   Select  Quality of Life      Quality of Life Scores   Health/Function Pre  20.39 %    Socioeconomic Pre  23.33 %    Psych/Spiritual Pre  23.57 %    Family Pre  25.5 %    GLOBAL Pre  22.44 %      Scores of 19 and below usually indicate a poorer quality of life in these areas.  A difference of  2-3 points is a clinically meaningful difference.  A difference of 2-3 points in the total score of the Quality of Life Index has been associated with significant improvement in overall quality of life, self-image, physical symptoms, and general health in studies assessing change in quality of life.  PHQ-9: Recent Review Flowsheet Data    Depression screen [facility] 2/9 01/05/2020   Decreased Interest 0   Down, Depressed, Hopeless 0   PHQ - 2 Score 0     Interpretation of Total Score  Total Score Depression Severity:  1-4 = Minimal depression, 5-9 = Mild depression, 10-14 = Moderate depression, 15-19 = Moderately severe depression, 20-27 = Severe depression   Psychosocial Evaluation and Intervention:   Psychosocial Re-Evaluation:   Psychosocial Discharge (Final Psychosocial Re-Evaluation):   Vocational Rehabilitation: Provide vocational rehab assistance to qualifying  candidates.   Vocational Rehab Evaluation & Intervention: Vocational Rehab - 01/05/20 1535      Initial Vocational Rehab Evaluation & Intervention   Assessment shows need for Vocational Rehabilitation  No       Education: Education Goals: Education classes will be provided on a weekly basis, covering required topics. Participant will state understanding/return demonstration of topics presented.  Learning Barriers/Preferences: Learning Barriers/Preferences - 01/05/20 1554      Learning Barriers/Preferences   Learning Barriers  Sight    Learning Preferences  Written Material;Verbal Instruction;Individual Instruction       Education Topics: Hypertension, Hypertension Reduction -Define heart disease and high blood pressure. Discus how high blood pressure affects  the body and ways to reduce high blood pressure.   Exercise and Your Heart -Discuss why it is important to exercise, the FITT principles of exercise, normal and abnormal responses to exercise, and how to exercise safely.   Angina -Discuss definition of angina, causes of angina, treatment of angina, and how to decrease risk of having angina.   Cardiac Medications -Review what the following cardiac medications are used for, how they affect the body, and side effects that may occur when taking the medications.  Medications include Aspirin, Beta blockers, calcium channel blockers, ACE Inhibitors, angiotensin receptor blockers, diuretics, digoxin, and antihyperlipidemics.   Congestive Heart Failure -Discuss the definition of CHF, how to live with CHF, the signs and symptoms of CHF, and how keep track of weight and sodium intake.   Heart Disease and Intimacy -Discus the effect sexual activity has on the heart, how changes occur during intimacy as we age, and safety during sexual activity.   Smoking Cessation / COPD -Discuss different methods to quit smoking, the health benefits of quitting smoking, and the definition of  COPD.   Nutrition I: Fats -Discuss the types of cholesterol, what cholesterol does to the heart, and how cholesterol levels can be controlled.   Nutrition II: Labels -Discuss the different components of food labels and how to read food label   Heart Parts/Heart Disease and PAD -Discuss the anatomy of the heart, the pathway of blood circulation through the heart, and these are affected by heart disease.   Stress I: Signs and Symptoms -Discuss the causes of stress, how stress may lead to anxiety and depression, and ways to limit stress.   Stress II: Relaxation -Discuss different types of relaxation techniques to limit stress.   Warning Signs of Stroke / TIA -Discuss definition of a stroke, what the signs and symptoms are of a stroke, and how to identify when someone is having stroke.   Knowledge Questionnaire Score: Knowledge Questionnaire Score - 01/05/20 1553      Knowledge Questionnaire Score   Pre Score  20/24       Core Components/Risk Factors/Patient Goals at Admission: Personal Goals and Risk Factors at Admission - 01/05/20 1628      Core Components/Risk Factors/Patient Goals on Admission    Weight Management  Yes;Weight Maintenance;Obesity;Weight Loss    Intervention  Weight Management: Develop a combined nutrition and exercise program designed to reach desired caloric intake, while maintaining appropriate intake of nutrient and fiber, sodium and fats, and appropriate energy expenditure required for the weight goal.;Weight Management: Provide education and appropriate resources to help participant work on and attain dietary goals.;Weight Management/Obesity: Establish reasonable short term and long term weight goals.    Hypertension  Yes    Intervention  Provide education on lifestyle modifcations including regular physical activity/exercise, weight management, moderate sodium restriction and increased consumption of fresh fruit, vegetables, and low fat dairy, alcohol  moderation, and smoking cessation.;Monitor prescription use compliance.    Expected Outcomes  Short Term: Continued assessment and intervention until BP is < 140/11mm HG in hypertensive participants. < 130/46mm HG in hypertensive participants with diabetes, heart failure or chronic kidney disease.;Long Term: Maintenance of blood pressure at goal levels.    Lipids  Yes    Intervention  Provide education and support for participant on nutrition & aerobic/resistive exercise along with prescribed medications to achieve LDL 70mg , HDL >40mg .    Expected Outcomes  Short Term: Participant states understanding of desired cholesterol values and is compliant with medications prescribed. Participant  is following exercise prescription and nutrition guidelines.;Long Term: Cholesterol controlled with medications as prescribed, with individualized exercise RX and with personalized nutrition plan. Value goals: LDL < 70mg , HDL > 40 mg.       Core Components/Risk Factors/Patient Goals Review:    Core Components/Risk Factors/Patient Goals at Discharge (Final Review):    ITP Comments: ITP Comments    Row Name 01/05/20 1526           ITP Comments  Dr Armanda Magicraci Turner MD, Medical Director          Comments: Patient attended orientation on 01/05/2020 to review rules and guidelines for program.  Completed 6 minute walk test, Intitial ITP, and exercise prescription.  VSS. Telemetry-Sinus Rhythm Intermittent PVC's.  Asymptomatic. Will send today's ECG tracings to Dr Earmon Phoenixooper[facility]. Safety measures and social distancing in place per CDC guidelines. Oxygen saturation 94-95%mon room air. Patient has a history of Asthma and says that [facility]. Patient denied SOB today. Lung fields clear upon ascultation.Gladstone LighterMaria Stephannie Broner, RN,BSN 01/05/2020 4:34 PM

## 2020-01-05 NOTE — Progress Notes (Signed)
Cardiac Rehab Medication Review by a Nurse  Does the patient  feel that his/her medications are working for him/her?  yes  Has the patient been experiencing any side effects to the medications prescribed?  yes  Does the patient measure his/her own blood pressure or blood glucose at home?  yes   Does the patient have any problems obtaining medications due to transportation or finances?   no  Understanding of regimen: good Understanding of indications: good Potential of compliance: good    Nurse  comments: Patient is taking his medications as prescribed as is complaint with his medications. [name] says that he is taking a 1/2 bisoprolol once a day. Patient has a blood pressure cuff to monitor his vitals at home.Gladstone Lighter, RN,BSN 01/05/2020 4:45 PM   Arta Bruce Timoteo Carreiro 01/05/2020 4:42 PM

## 2020-01-05 NOTE — Telephone Encounter (Signed)
Cardiac Rehab - Pharmacy Resident Documentation   Patient unable to be reached after two call attempts. Please complete allergy verification and medication review during patient's cardiac rehab appointment.

## 2020-01-13 ENCOUNTER — Telehealth: Payer: Self-pay | Admitting: Physician Assistant

## 2020-01-13 DIAGNOSIS — I493 Ventricular premature depolarization: Secondary | ICD-10-CM

## 2020-01-13 NOTE — Telephone Encounter (Signed)
I received his tele strips from cardiac rehab. He had frequent PVCs during his intake. Please arrange 24 hour Zio to quantify PVCs. I have placed the order already. Tereso Newcomer, PA-C    01/13/2020 2:06 PM

## 2020-01-16 ENCOUNTER — Telehealth: Payer: Self-pay | Admitting: Radiology

## 2020-01-16 NOTE — Telephone Encounter (Signed)
Attempted to reach patient to verify info and go over monitor instructions. No voicemail set up

## 2020-01-16 NOTE — Telephone Encounter (Signed)
Orpha Bur, orders are in for Zio. Do I need to do anything else?

## 2020-01-17 NOTE — Telephone Encounter (Signed)
Enrolled patient for a 3 day Zio monitor to be mailed to patients home. Brief instructions were gone over with the patient and he knows to expect the monitor to arrive in 3-4 days. Patient is aware per ordering PA he only needs to wear for 24 hours.

## 2020-01-19 ENCOUNTER — Encounter (HOSPITAL_COMMUNITY): Payer: Self-pay | Admitting: *Deleted

## 2020-01-19 DIAGNOSIS — Z955 Presence of coronary angioplasty implant and graft: Secondary | ICD-10-CM

## 2020-01-19 DIAGNOSIS — I2119 ST elevation (STEMI) myocardial infarction involving other coronary artery of inferior wall: Secondary | ICD-10-CM

## 2020-01-19 NOTE — Telephone Encounter (Signed)
Monitor was enrolled and mailed on 4/5

## 2020-01-19 NOTE — Progress Notes (Signed)
Cardiac Individual Treatment Plan  Patient Details  Name: Kevin Guzman MRN: [id number] Date of Birth: 1951-05-24 Referring Provider:     CARDIAC REHAB PHASE II ORIENTATION from 01/05/2020 in [name] CARDIAC REHAB  Referring Provider  Tonny Bollman MD      Initial Encounter Date:    CARDIAC REHAB PHASE II ORIENTATION from 01/05/2020 in [facility] CARDIAC REHAB  Date  01/05/20      Visit Diagnosis: Acute ST elevation myocardial infarction (STEMI) of inferior wall (HCC)  S/P DES GRAFT X 2 12/06/19  Patient's Home Medications on Admission:  Current Outpatient Medications:  .  albuterol (PROVENTIL HFA;VENTOLIN HFA) 108 (90 Base) MCG/ACT inhaler, Inhale 2 puffs into the lungs every 4 (four) hours as needed for wheezing or shortness of breath. , Disp: , Rfl:  .  albuterol (PROVENTIL) (2.5 MG/3ML) 0.083% nebulizer solution, Take 2.5 mg by nebulization every 6 (six) hours as needed for wheezing or shortness of breath., Disp: , Rfl:  .  allopurinol (ZYLOPRIM) 300 MG tablet, Take 300 mg by mouth daily., Disp: , Rfl:  .  aspirin 81 MG EC tablet, Take 81 mg by mouth daily.  , Disp: , Rfl:  .  atorvastatin (LIPITOR) 80 MG tablet, TAKE 1 TABLET (80 MG TOTAL) BY MOUTH DAILY AT 6 PM., Disp: 90 tablet, Rfl: 1 .  bisoprolol (ZEBETA) 5 MG tablet, Take 0.5 tablets (2.5 mg total) by mouth daily., Disp: 45 tablet, Rfl: 3 .  ezetimibe (ZETIA) 10 MG tablet, Take 1 tablet (10 mg total) by mouth daily., Disp: 90 tablet, Rfl: 3 .  fexofenadine (ALLEGRA) 180 MG tablet, Take 180 mg by mouth daily as needed for allergies or rhinitis., Disp: , Rfl:  .  fluticasone furoate-vilanterol (BREO ELLIPTA) 100-25 MCG/INH AEPB, Inhale 1 puff into the lungs daily., Disp: , Rfl:  .  Melatonin 10 MG CAPS, Take 10 mg by mouth at bedtime as needed (for sleep)., Disp: , Rfl:  .  nitroGLYCERIN (NITROSTAT) 0.4 MG SL tablet, Place 1 tablet (0.4 mg total) under the tongue every 5 (five)  minutes x 3 doses as needed for chest pain., Disp: 25 tablet, Rfl: 2 .  prasugrel (EFFIENT) 10 MG TABS tablet, Take 1 tablet (10 mg total) by mouth daily., Disp: 90 tablet, Rfl: 2  Past Medical History: Past Medical History:  Diagnosis Date  . Acute ST elevation myocardial infarction (STEMI) of inferior wall (HCC) 12/06/2019  . Asthma   . CAD (coronary artery disease)    a. 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 EF 65% at cath 8/08  . Coronary artery disease involving coronary bypass graft of native heart with angina pectoris [facility]) 12/07/2019   12/06/2019: Inferior STEMI-thrombotic occlusion of SVG-OM treated with rheolytic (AngioJet) thrombectomy and 2 site DES PCI.  Marland Kitchen HLD (hyperlipidemia)   . HTN (hypertension)   . STEMI (ST elevation myocardial infarction) (HCC) 12/06/2019  . Testosterone deficiency     Tobacco Use: Social History   Tobacco Use  Smoking Status Former Smoker  Smokeless Tobacco Former Neurosurgeon  . Quit date: 10/13/1998    Labs: Recent Review Flowsheet Data    Labs for ITP Cardiac and Pulmonary Rehab Latest Ref Rng & Units 01/27/2019 05/23/2019 09/13/2019 12/06/2019 12/07/2019   Cholestrol 0 - 200 mg/dL 646 803 212 248 250   LDLCALC 0 - 99 mg/dL 037(C) 99 71 86 87   LDLDIRECT mg/dL - - - - -  HDL >40 mg/dL 55 48 63 51 51   Trlycerides <150 mg/dL 742 595(G) 387 564 332   Hemoglobin A1c 4.8 - 5.6 % - - - 5.8(H) -      Capillary Blood Glucose: No results found for: GLUCAP   Exercise Target Goals: Exercise Program Goal: Individual exercise prescription set using results from initial 6 min walk test and THRR while considering  patient's activity barriers and safety.   Exercise Prescription Goal: Starting with aerobic activity 30 plus minutes a day, 3 days per week for initial exercise prescription. Provide home exercise prescription and guidelines that participant acknowledges understanding prior to discharge.  Activity Barriers & Risk  Stratification: Activity Barriers & Cardiac Risk Stratification - 01/05/20 1553      Activity Barriers & Cardiac Risk Stratification   Activity Barriers  Joint Problems    Cardiac Risk Stratification  High       6 Minute Walk: 6 Minute Walk    Row Name 01/05/20 1539         6 Minute Walk   Phase  Initial     Distance  1261 feet     Walk Time  6 minutes     # of Rest Breaks  0     MPH  2.4     METS  2.4     RPE  12     Perceived Dyspnea   0     VO2 Peak  8.3     Symptoms  No     Resting HR  82 bpm     Resting BP  104/60     Resting Oxygen Saturation   95 %     Exercise Oxygen Saturation  during 6 min walk  95 %     Max Ex. HR  93 bpm     Max Ex. BP  120/70     2 Minute Post BP  110/60        Oxygen Initial Assessment:   Oxygen Re-Evaluation:   Oxygen Discharge (Final Oxygen Re-Evaluation):   Initial Exercise Prescription: Initial Exercise Prescription - 01/05/20 1600      Date of Initial Exercise RX and Referring Provider   Date  01/05/20    Referring Provider  Tonny Bollman MD    Expected Discharge Date  03/16/20      Treadmill   MPH  1.6    Grade  1    Minutes  15    METs  2.4      NuStep   Level  2    SPM  85    Minutes  15    METs  2.2      Prescription Details   Frequency (times per week)  3x    Duration  Progress to 30 minutes of continuous aerobic without signs/symptoms of physical distress      Intensity   THRR 40-80% of Max Heartrate  61-122    Ratings of Perceived Exertion  11-13    Perceived Dyspnea  0-4      Progression   Progression  Continue progressive overload as per policy without signs/symptoms or physical distress.      Resistance Training   Training Prescription  Yes    Weight  3lbs    Reps  10-15       Perform Capillary Blood Glucose checks as needed.  Exercise Prescription Changes:   Exercise Comments:   Exercise Goals and Review: Exercise Goals    Row Name 01/05/20 1550  Exercise  Goals   Increase Physical Activity  Yes       Intervention  Provide advice, education, support and counseling about physical activity/exercise needs.;Develop an individualized exercise prescription for aerobic and resistive training based on initial evaluation findings, risk stratification, comorbidities and participant's personal goals.       Expected Outcomes  Short Term: Attend rehab on a regular basis to increase amount of physical activity.;Long Term: Add in home exercise to make exercise part of routine and to increase amount of physical activity.;Long Term: Exercising regularly at least 3-5 days a week.       Increase Strength and Stamina  Yes       Intervention  Provide advice, education, support and counseling about physical activity/exercise needs.;Develop an individualized exercise prescription for aerobic and resistive training based on initial evaluation findings, risk stratification, comorbidities and participant's personal goals.       Expected Outcomes  Short Term: Increase workloads from initial exercise prescription for resistance, speed, and METs.;Short Term: Perform resistance training exercises routinely during rehab and add in resistance training at home;Long Term: Improve cardiorespiratory fitness, muscular endurance and strength as measured by increased METs and functional capacity ( )       Able to understand and use rate of perceived exertion (RPE) scale  Yes       Intervention  Provide education and explanation on how to use RPE scale       Expected Outcomes  Short Term: Able to use RPE daily in rehab to express subjective intensity level;Long Term:  Able to use RPE to guide intensity level when exercising independently       Knowledge and understanding of Target Heart Rate Range (THRR)  Yes       Intervention  Provide education and explanation of THRR including how the numbers were predicted and where they are located for reference       Expected Outcomes  Short Term: Able  to state/look up THRR;Long Term: Able to use THRR to govern intensity when exercising independently;Short Term: Able to use daily as guideline for intensity in rehab       Able to check pulse independently  Yes       Intervention  Provide education and demonstration on how to check pulse in carotid and radial arteries.;Review the importance of being able to check your own pulse for safety during independent exercise       Expected Outcomes  Short Term: Able to explain why pulse checking is important during independent exercise;Long Term: Able to check pulse independently and accurately       Understanding of Exercise Prescription  Yes       Intervention  Provide education, explanation, and written materials on patient's individual exercise prescription       Expected Outcomes  Short Term: Able to explain program exercise prescription;Long Term: Able to explain home exercise prescription to exercise independently          Exercise Goals Re-Evaluation :    Discharge Exercise Prescription (Final Exercise Prescription Changes):   Nutrition:  Target Goals: Understanding of nutrition guidelines, daily intake of sodium 1500mg , cholesterol 200mg , calories 30% from fat and 7% or less from saturated fats, daily to have 5 or more servings of fruits and vegetables.  Biometrics: Pre Biometrics - 01/05/20 1549      Pre Biometrics   Height  5' 3.3" (1.608 m)    Weight  200 lb 9.9 oz (91 kg)    Waist Circumference  44  inches    Hip Circumference  42 inches    Waist to Hip Ratio  1.05 %    BMI (Calculated)  35.19    Triceps Skinfold  15 mm    % Body Fat  32.9 %    Grip Strength  40 kg    Flexibility  20 in    Single Leg Stand  30 seconds        Nutrition Therapy Plan and Nutrition Goals:   Nutrition Assessments:   Nutrition Goals Re-Evaluation:   Nutrition Goals Discharge (Final Nutrition Goals Re-Evaluation):   Psychosocial: Target Goals: Acknowledge presence or absence of  significant depression and/or stress, maximize coping skills, provide positive support system. Participant is able to verbalize types and ability to use techniques and skills needed for reducing stress and depression.  Initial Review & Psychosocial Screening: Initial Psych Review & Screening - 01/05/20 1534      Initial Review   Current issues with  None Identified      Family Dynamics   Good Support System?  Yes   [name] has his wife for support     Barriers   Psychosocial barriers to participate in program  There are no identifiable barriers or psychosocial needs.      Screening Interventions   Interventions  Encouraged to exercise       Quality of Life Scores: Quality of Life - 01/05/20 1555      Quality of Life   Select  Quality of Life      Quality of Life Scores   Health/Function Pre  20.39 %    Socioeconomic Pre  23.33 %    Psych/Spiritual Pre  23.57 %    Family Pre  25.5 %    GLOBAL Pre  22.44 %      Scores of 19 and below usually indicate a poorer quality of life in these areas.  A difference of  2-3 points is a clinically meaningful difference.  A difference of 2-3 points in the total score of the Quality of Life Index has been associated with significant improvement in overall quality of life, self-image, physical symptoms, and general health in studies assessing change in quality of life.  PHQ-9: Recent Review Flowsheet Data    Depression screen [facility] 2/9 01/05/2020   Decreased Interest 0   Down, Depressed, Hopeless 0   PHQ - 2 Score 0     Interpretation of Total Score  Total Score Depression Severity:  1-4 = Minimal depression, 5-9 = Mild depression, 10-14 = Moderate depression, 15-19 = Moderately severe depression, 20-27 = Severe depression   Psychosocial Evaluation and Intervention:   Psychosocial Re-Evaluation:   Psychosocial Discharge (Final Psychosocial Re-Evaluation):   Vocational Rehabilitation: Provide vocational rehab assistance to qualifying  candidates.   Vocational Rehab Evaluation & Intervention: Vocational Rehab - 01/05/20 1535      Initial Vocational Rehab Evaluation & Intervention   Assessment shows need for Vocational Rehabilitation  No       Education: Education Goals: Education classes will be provided on a weekly basis, covering required topics. Participant will state understanding/return demonstration of topics presented.  Learning Barriers/Preferences: Learning Barriers/Preferences - 01/05/20 1554      Learning Barriers/Preferences   Learning Barriers  Sight    Learning Preferences  Written Material;Verbal Instruction;Individual Instruction       Education Topics: Hypertension, Hypertension Reduction -Define heart disease and high blood pressure. Discus how high blood pressure affects the body and ways to reduce high blood pressure.  Exercise and Your Heart -Discuss why it is important to exercise, the FITT principles of exercise, normal and abnormal responses to exercise, and how to exercise safely.   Angina -Discuss definition of angina, causes of angina, treatment of angina, and how to decrease risk of having angina.   Cardiac Medications -Review what the following cardiac medications are used for, how they affect the body, and side effects that may occur when taking the medications.  Medications include Aspirin, Beta blockers, calcium channel blockers, ACE Inhibitors, angiotensin receptor blockers, diuretics, digoxin, and antihyperlipidemics.   Congestive Heart Failure -Discuss the definition of CHF, how to live with CHF, the signs and symptoms of CHF, and how keep track of weight and sodium intake.   Heart Disease and Intimacy -Discus the effect sexual activity has on the heart, how changes occur during intimacy as we age, and safety during sexual activity.   Smoking Cessation / COPD -Discuss different methods to quit smoking, the health benefits of quitting smoking, and the definition of  COPD.   Nutrition I: Fats -Discuss the types of cholesterol, what cholesterol does to the heart, and how cholesterol levels can be controlled.   Nutrition II: Labels -Discuss the different components of food labels and how to read food label   Heart Parts/Heart Disease and PAD -Discuss the anatomy of the heart, the pathway of blood circulation through the heart, and these are affected by heart disease.   Stress I: Signs and Symptoms -Discuss the causes of stress, how stress may lead to anxiety and depression, and ways to limit stress.   Stress II: Relaxation -Discuss different types of relaxation techniques to limit stress.   Warning Signs of Stroke / TIA -Discuss definition of a stroke, what the signs and symptoms are of a stroke, and how to identify when someone is having stroke.   Knowledge Questionnaire Score: Knowledge Questionnaire Score - 01/05/20 1553      Knowledge Questionnaire Score   Pre Score  20/24       Core Components/Risk Factors/Patient Goals at Admission: Personal Goals and Risk Factors at Admission - 01/05/20 1628      Core Components/Risk Factors/Patient Goals on Admission    Weight Management  Yes;Weight Maintenance;Obesity;Weight Loss    Intervention  Weight Management: Develop a combined nutrition and exercise program designed to reach desired caloric intake, while maintaining appropriate intake of nutrient and fiber, sodium and fats, and appropriate energy expenditure required for the weight goal.;Weight Management: Provide education and appropriate resources to help participant work on and attain dietary goals.;Weight Management/Obesity: Establish reasonable short term and long term weight goals.    Hypertension  Yes    Intervention  Provide education on lifestyle modifcations including regular physical activity/exercise, weight management, moderate sodium restriction and increased consumption of fresh fruit, vegetables, and low fat dairy, alcohol  moderation, and smoking cessation.;Monitor prescription use compliance.    Expected Outcomes  Short Term: Continued assessment and intervention until BP is < 140/47mm HG in hypertensive participants. < 130/42mm HG in hypertensive participants with diabetes, heart failure or chronic kidney disease.;Long Term: Maintenance of blood pressure at goal levels.    Lipids  Yes    Intervention  Provide education and support for participant on nutrition & aerobic/resistive exercise along with prescribed medications to achieve LDL 70mg , HDL >40mg .    Expected Outcomes  Short Term: Participant states understanding of desired cholesterol values and is compliant with medications prescribed. Participant is following exercise prescription and nutrition guidelines.;Long Term: Cholesterol controlled with  medications as prescribed, with individualized exercise RX and with personalized nutrition plan. Value goals: LDL < 70mg , HDL > 40 mg.       Core Components/Risk Factors/Patient Goals Review:    Core Components/Risk Factors/Patient Goals at Discharge (Final Review):    ITP Comments: ITP Comments    Row Name 01/05/20 1526 01/19/20 1659         ITP Comments  Dr Armanda Magicraci Turner MD, Medical Director  30 Day ITP Review. Patient is to start exercise on 01/23/20         Comments: see ITP comments.Glad[place] LighterMaria Hyland Mollenkopf, RN,BSN 01/19/2020 5:01 PM

## 2020-01-23 ENCOUNTER — Other Ambulatory Visit: Payer: Self-pay

## 2020-01-23 ENCOUNTER — Encounter (HOSPITAL_COMMUNITY)
Admission: RE | Admit: 2020-01-23 | Discharge: 2020-01-23 | Disposition: A | Discharge status: Home / Self Care | Payer: Medicare Other | Source: Ambulatory Visit | Attending: Cardiovascular Disease | Admitting: Cardiovascular Disease

## 2020-01-23 ENCOUNTER — Encounter (HOSPITAL_COMMUNITY): Payer: Medicare Other

## 2020-01-23 DIAGNOSIS — Z955 Presence of coronary angioplasty implant and graft: Secondary | ICD-10-CM | POA: Insufficient documentation

## 2020-01-23 DIAGNOSIS — I2119 ST elevation (STEMI) myocardial infarction involving other coronary artery of inferior wall: Secondary | ICD-10-CM | POA: Diagnosis not present

## 2020-01-23 NOTE — Progress Notes (Signed)
Daily Session Note  Patient Details  Name: Kevin Guzman MRN: [id number] Date of Birth: 18-Feb-1951 Referring Provider:     CARDIAC REHAB PHASE II ORIENTATION from 01/05/2020 in [place]  Referring Provider  Sherren Mocha MD      Encounter Date: 01/23/2020  Check In: Session Check In - 01/25/20 1002      Check-In   Supervising physician immediately available to respond to emergencies  [facility] immediately available    Physician(s)  Dr. Doristine Bosworth    Location  [facility]    Staff Present  Barnet Pall, RN, Luisa Hart, RN, Bjorn Loser, MS, Exercise Physiologist    Virtual Visit  No    Medication changes reported      No    Fall or balance concerns reported     No    Tobacco Cessation  No Change    Warm-up and Cool-down  Performed on first and last piece of equipment    Resistance Training Performed  Yes    VAD Patient?  No    PAD/SET Patient?  No      Pain Assessment   Currently in Pain?  No/denies    Pain Score  0-No pain       Capillary Blood Glucose: No results found for this or any previous visit (from the past 24 hour(s)).    Social History   Tobacco Use  Smoking Status Former Smoker  Smokeless Tobacco Former Systems developer  . Quit date: 10/13/1998    Goals Met:  Exercise tolerated well No report of cardiac concerns or symptoms Strength training completed today  Goals Unmet:  Not Applicable  Comments: Pt started cardiac rehab today.  Pt tolerated light exercise without difficulty. VSS, telemetry-Sinus Rhythm, asymptomatic.  Medication list reconciled. Pt denies barriers to medicaiton compliance.  PSYCHOSOCIAL ASSESSMENT:  PHQ-0. Pt exhibits positive coping skills, hopeful outlook with supportive family. No psychosocial needs identified at this time, no psychosocial interventions necessary.    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   Pt oriented to  exercise equipment and routine.    Understanding verbalized.Barnet Pall, RN,BSN 01/25/2020 10:04 AM   Dr. Fransico Him is Medical Director for Cardiac Rehab at [facility].

## 2020-01-24 ENCOUNTER — Other Ambulatory Visit (INDEPENDENT_AMBULATORY_CARE_PROVIDER_SITE_OTHER): Payer: Medicare Other

## 2020-01-24 DIAGNOSIS — I493 Ventricular premature depolarization: Secondary | ICD-10-CM

## 2020-01-25 ENCOUNTER — Encounter (HOSPITAL_COMMUNITY): Payer: Medicare Other

## 2020-01-25 ENCOUNTER — Telehealth (HOSPITAL_COMMUNITY): Payer: Self-pay | Admitting: Family Medicine

## 2020-01-26 ENCOUNTER — Telehealth: Payer: Self-pay | Admitting: Physician Assistant

## 2020-01-26 NOTE — Telephone Encounter (Signed)
New Message    Pt is calling and is wondering how long he needs to wear his monitor    Please advise

## 2020-01-26 NOTE — Telephone Encounter (Signed)
Patient instructed he needs to wear his monitor at least 24 hours and can wear it up to 3 days. Patient sent text with activation code for My Chart sign up.

## 2020-01-27 ENCOUNTER — Other Ambulatory Visit: Payer: Self-pay

## 2020-01-27 ENCOUNTER — Encounter (HOSPITAL_COMMUNITY): Payer: Medicare Other

## 2020-01-27 ENCOUNTER — Encounter (HOSPITAL_COMMUNITY)
Admission: RE | Admit: 2020-01-27 | Discharge: 2020-01-27 | Disposition: A | Discharge status: Home / Self Care | Payer: Medicare Other | Source: Ambulatory Visit | Attending: Cardiovascular Disease | Admitting: Cardiovascular Disease

## 2020-01-27 DIAGNOSIS — I2119 ST elevation (STEMI) myocardial infarction involving other coronary artery of inferior wall: Secondary | ICD-10-CM | POA: Diagnosis not present

## 2020-01-27 DIAGNOSIS — Z955 Presence of coronary angioplasty implant and graft: Secondary | ICD-10-CM

## 2020-01-30 ENCOUNTER — Encounter (HOSPITAL_COMMUNITY)
Admission: RE | Admit: 2020-01-30 | Discharge: 2020-01-30 | Disposition: A | Discharge status: Home / Self Care | Payer: Medicare Other | Source: Ambulatory Visit | Attending: Cardiovascular Disease | Admitting: Cardiovascular Disease

## 2020-01-30 ENCOUNTER — Encounter (HOSPITAL_COMMUNITY): Payer: Medicare Other

## 2020-01-30 ENCOUNTER — Other Ambulatory Visit: Payer: Self-pay

## 2020-01-30 DIAGNOSIS — I2119 ST elevation (STEMI) myocardial infarction involving other coronary artery of inferior wall: Secondary | ICD-10-CM

## 2020-01-30 DIAGNOSIS — Z955 Presence of coronary angioplasty implant and graft: Secondary | ICD-10-CM

## 2020-02-01 ENCOUNTER — Encounter: Payer: Self-pay | Admitting: Physician Assistant

## 2020-02-01 ENCOUNTER — Ambulatory Visit (INDEPENDENT_AMBULATORY_CARE_PROVIDER_SITE_OTHER): Payer: Medicare Other | Admitting: Physician Assistant

## 2020-02-01 ENCOUNTER — Other Ambulatory Visit: Payer: Self-pay

## 2020-02-01 VITALS — BP 102/50 | HR 60 | Ht 64.0 in | Wt 202.0 lb

## 2020-02-01 DIAGNOSIS — I493 Ventricular premature depolarization: Secondary | ICD-10-CM

## 2020-02-01 DIAGNOSIS — I10 Essential (primary) hypertension: Secondary | ICD-10-CM

## 2020-02-01 DIAGNOSIS — I2510 Atherosclerotic heart disease of native coronary artery without angina pectoris: Secondary | ICD-10-CM | POA: Diagnosis not present

## 2020-02-01 DIAGNOSIS — R29810 Facial weakness: Secondary | ICD-10-CM

## 2020-02-01 DIAGNOSIS — E782 Mixed hyperlipidemia: Secondary | ICD-10-CM

## 2020-02-01 NOTE — Patient Instructions (Addendum)
Medication Instructions:  NONE *If you need a refill on your cardiac medications before your next appointment, please call your pharmacy*   Lab Work: NONE If you have labs (blood work) drawn today and your tests are completely normal, you will receive your results only by: Marland Kitchen MyChart Message (if you have MyChart) OR . A paper copy in the mail If you have any lab test that is abnormal or we need to change your treatment, we will call you to review the results.   Testing/Procedures: NONE   Follow-Up: At [facility], you and your health needs are our priority.  As part of our continuing mission to provide you with exceptional heart care, we have created designated Provider Care Teams.  These Care Teams include your primary Cardiologist (physician) and Advanced Practice Providers (APPs -  Physician Assistants and Nurse Practitioners) who all work together to provide you with the care you need, when you need it.  We recommend signing up for the patient portal called "MyChart".  Sign up information is provided on this After Visit Summary.  MyChart is used to connect with patients for Virtual Visits (Telemedicine).  Patients are able to view lab/test results, encounter notes, upcoming appointments, etc.  Non-urgent messages can be sent to your provider as well.   To learn more about what you can do with MyChart, go to [email or web address].    Your next appointment:   6 MONTHS  The format for your next appointment:   In Person  Provider:   You may see Tonny Bollman, MD or one of the following Advanced Practice Providers on your designated Care Team:     Other Instructions

## 2020-02-01 NOTE — Progress Notes (Signed)
Cardiology Office Note:    Date:  02/01/2020   ID:  Kevin Guzman, DOB Apr 15, 1951, MRN [id number]  PCP:  Verlon Au, MD  Cardiologist:  Tonny Bollman, MD   Electrophysiologist:  None   Referring MD: Verlon Au, MD   Chief Complaint:  Follow-up (CAD, HTN)    Patient Profile:    Kevin Guzman is a 69 y.o. male with:   Coronary artery disease  ? S/p NSTEMI in 2008 >> CABG ? S/p inf STEMI 11/2019>>PCI:  DES x 2 to S-OM  Hypertension   Hyperlipidemia    Prior CV studies: Echocardiogram 12/07/2019 EF 60-65, Gr 1 DD, normal RVSF, trivial MR  Cardiac catheterization 12/06/2019 LM mid 50 LAD ost 100 OM1 100 RCA prox 50 L-LAD ok S-D1 ok S-OM 80, mid 100 EF 55-65 PCI: 3.5 x 26 mm Resolute Onyx DES, 4 x 26 mm Resolute Onyx DES to S-OM  Myoview 02/24/2019 EF 63, Low Risk  History of Present Illness:    [name] was admitted 2/23-2/25 with an inferior STEMI c/b acute diastolic CHF.  Cath showed an occluded S-OM and this was tx with DES x 2.  He was last seen 12/21/2019 for post hospitalization follow up.  He was somewhat hypotensive and I reduced his bisoprolol.   He had a lot of PVCs noted at intake at cardiac rehabilitation.  We ordered a Holter to rule out significant PVC burden.    He returns for follow up.  He is here alone. He has not had any exertional angina at cardiac rehabilitation.  He has not had significant shortness of breath.  He notes rare episodes of palpitations at night.  These seemed to be rapid and only lasted 5 seconds or less.  He thinks one episode was caught on his monitor.  He also had 1 very brief sensation in his L chest but no recurrence.  He has not had orthopnea, paroxysmal nocturnal dyspnea, leg swelling, syncope or near syncope.  He has eczema and has a lot of abrasions from scratching which will bleed as he is on dual antiplatelet Rx.    Past Medical History:  Diagnosis Date  . Acute ST elevation myocardial  infarction (STEMI) of inferior wall (HCC) 12/06/2019  . Asthma   . CAD (coronary artery disease)    a. 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 EF 65% at cath 8/08  . Coronary artery disease involving coronary bypass graft of native heart with angina pectoris [facility]) 12/07/2019   12/06/2019: Inferior STEMI-thrombotic occlusion of SVG-OM treated with rheolytic (AngioJet) thrombectomy and 2 site DES PCI.  Marland Kitchen HLD (hyperlipidemia)   . HTN (hypertension)   . STEMI (ST elevation myocardial infarction) (HCC) 12/06/2019  . Testosterone deficiency     Current Medications: Current Meds  Medication Sig  . albuterol (PROVENTIL HFA;VENTOLIN HFA) 108 (90 Base) MCG/ACT inhaler Inhale 2 puffs into the lungs every 4 (four) hours as needed for wheezing or shortness of breath.   Marland Kitchen albuterol (PROVENTIL) (2.5 MG/3ML) 0.083% nebulizer solution Take 2.5 mg by nebulization every 6 (six) hours as needed for wheezing or shortness of breath.  . allopurinol (ZYLOPRIM) 300 MG tablet Take 300 mg by mouth daily.  Marland Kitchen aspirin 81 MG EC tablet Take 81 mg by mouth daily.    Marland Kitchen atorvastatin (LIPITOR) 80 MG tablet TAKE 1 TABLET (80 MG TOTAL) BY MOUTH DAILY AT 6 PM.  . bisoprolol (ZEBETA) 5 MG tablet Take 0.5 tablets (  2.5 mg total) by mouth daily.  Marland Kitchen ezetimibe (ZETIA) 10 MG tablet Take 1 tablet (10 mg total) by mouth daily.  . fexofenadine (ALLEGRA) 180 MG tablet Take 180 mg by mouth daily as needed for allergies or rhinitis.  . fluticasone furoate-vilanterol (BREO ELLIPTA) 100-25 MCG/INH AEPB Inhale 1 puff into the lungs as needed (SOB/wheezing).   . Melatonin 10 MG CAPS Take 10 mg by mouth at bedtime as needed (for sleep).  . nitroGLYCERIN (NITROSTAT) 0.4 MG SL tablet Place 1 tablet (0.4 mg total) under the tongue every 5 (five) minutes x 3 doses as needed for chest pain.  . prasugrel (EFFIENT) 10 MG TABS tablet Take 1 tablet (10 mg total) by mouth daily.     Allergies:   Almond meal, Almond oil, and Ace  inhibitors   Social History   Tobacco Use  . Smoking status: Former Games developer  . Smokeless tobacco: Former Neurosurgeon    Quit date: 10/13/1998  Substance Use Topics  . Alcohol use: No  . Drug use: No     Family Hx: The patient's family history includes Cancer in his mother. There is no history of Heart attack or Stroke.  Review of Systems  Neurological:       ? L sided mouth droop      EKGs/Labs/Other Test Reviewed:    EKG:  EKG is  Not  ordered today.  The ekg ordered today demonstrates n/a  Recent Labs: 12/06/2019: ALT 24; TSH 2.756 12/21/2019: BUN 19; Creatinine, Ser 1.22; Hemoglobin 13.1; Platelets 340; Potassium 4.8; Sodium 142   Recent Lipid Panel Lab Results  Component Value Date/Time   CHOL 161 12/07/2019 02:42 AM   CHOL 159 09/13/2019 02:18 PM   TRIG 113 12/07/2019 02:42 AM   TRIG 199 (H) 08/18/2006 03:07 PM   HDL 51 12/07/2019 02:42 AM   HDL 63 09/13/2019 02:18 PM   CHOLHDL 3.2 12/07/2019 02:42 AM   LDLCALC 87 12/07/2019 02:42 AM   LDLCALC 71 09/13/2019 02:18 PM   LDLDIRECT 132.0 06/08/2015 05:32 PM    Physical Exam:    VS:  BP (!) 102/50   Pulse 60   Ht 5\' 4"  (1.626 m)   Wt 202 lb (91.6 kg)   SpO2 96%   BMI 34.67 kg/m     Wt Readings from Last 3 Encounters:  02/01/20 202 lb (91.6 kg)  01/05/20 200 lb 9.9 oz (91 kg)  12/21/19 200 lb 1.9 oz (90.8 kg)     Constitutional:      Appearance: Healthy appearance. Not in distress.  Neck:     Thyroid: No thyromegaly.     Vascular: No carotid bruit.  Pulmonary:     Breath sounds: No wheezing. No rales.  Cardiovascular:     Normal rate. Regular rhythm. Normal S1. Normal S2.     Murmurs: There is no murmur.  Edema:    Peripheral edema absent.  Abdominal:     Palpations: Abdomen is soft.  Skin:    General: Skin is warm and dry.  Neurological:     General: No focal deficit present.     Mental Status: Alert and oriented to person, place and time.     Cranial Nerves: Cranial nerves are intact.      Comments: Face symmetrical       ASSESSMENT & PLAN:    1. Coronary artery disease involving native coronary artery of native heart without angina pectoris Status post inferior ST elevation myocardial infarction 11/2019 treated with a drug-eluting  stent x2 to the vein graft to the obtuse marginal.  LIMA-LAD and vein graft to the diagonal were both patent.  EF was 60-65% with grade 1 diastolic dysfunction by echocardiogram.  He is doing well without angina.  Continue ASA, Prasugrel, Atorvastatin, Ezetimibe, bisoprolol.  FU with Dr. Burt Knack in 6 mos.   2. Essential hypertension The patient's blood pressure is controlled on his current regimen.  Continue current therapy.    3. Mixed hyperlipidemia Continue high intensity statin, ezetimibe.   4. PVC's (premature ventricular contractions) Noted during visit to cardiac rehabilitation.  He also notes occasional, brief palpitations.  Holter monitor pending.  Continue beta-blocker.    5. Drooping of mouth I do not see any neurologic deficits on exam.  There is no carotid bruit on exam.  I have asked him to follow up with primary care to evaluate.      Dispo:  Return in about 6 months (around 08/02/2020) for Routine Follow Up, w/ Dr. Burt Knack, or Richardson Dopp, PA-C, in person.   Medication Adjustments/Labs and Tests Ordered: Current medicines are reviewed at length with the patient today.  Concerns regarding medicines are outlined above.  Tests Ordered: No orders of the defined types were placed in this encounter.  Medication Changes: No orders of the defined types were placed in this encounter.   Signed, Richardson Dopp, PA-C  02/01/2020 6:02 PM    [facility] [address], [place], [place]  [address] Phone: [phone]; Fax: [phone]

## 2020-02-03 ENCOUNTER — Other Ambulatory Visit: Payer: Self-pay

## 2020-02-03 ENCOUNTER — Encounter (HOSPITAL_COMMUNITY): Payer: Medicare Other

## 2020-02-03 ENCOUNTER — Encounter (HOSPITAL_COMMUNITY)
Admission: RE | Admit: 2020-02-03 | Discharge: 2020-02-03 | Disposition: A | Discharge status: Home / Self Care | Payer: Medicare Other | Source: Ambulatory Visit | Attending: Cardiovascular Disease | Admitting: Cardiovascular Disease

## 2020-02-03 DIAGNOSIS — I2119 ST elevation (STEMI) myocardial infarction involving other coronary artery of inferior wall: Secondary | ICD-10-CM | POA: Diagnosis not present

## 2020-02-06 ENCOUNTER — Other Ambulatory Visit: Payer: Self-pay

## 2020-02-06 ENCOUNTER — Encounter (HOSPITAL_COMMUNITY): Payer: Medicare Other

## 2020-02-06 ENCOUNTER — Encounter (HOSPITAL_COMMUNITY)
Admission: RE | Admit: 2020-02-06 | Discharge: 2020-02-06 | Disposition: A | Discharge status: Home / Self Care | Payer: Medicare Other | Source: Ambulatory Visit | Attending: Cardiovascular Disease | Admitting: Cardiovascular Disease

## 2020-02-06 DIAGNOSIS — I2119 ST elevation (STEMI) myocardial infarction involving other coronary artery of inferior wall: Secondary | ICD-10-CM | POA: Diagnosis not present

## 2020-02-06 DIAGNOSIS — Z955 Presence of coronary angioplasty implant and graft: Secondary | ICD-10-CM

## 2020-02-08 ENCOUNTER — Other Ambulatory Visit: Payer: Self-pay

## 2020-02-08 ENCOUNTER — Encounter (HOSPITAL_COMMUNITY): Payer: Medicare Other

## 2020-02-08 ENCOUNTER — Encounter (HOSPITAL_COMMUNITY)
Admission: RE | Admit: 2020-02-08 | Discharge: 2020-02-08 | Disposition: A | Discharge status: Home / Self Care | Payer: Medicare Other | Source: Ambulatory Visit | Attending: Cardiovascular Disease | Admitting: Cardiovascular Disease

## 2020-02-08 VITALS — Ht 64.0 in | Wt 202.0 lb

## 2020-02-08 DIAGNOSIS — I2119 ST elevation (STEMI) myocardial infarction involving other coronary artery of inferior wall: Secondary | ICD-10-CM | POA: Diagnosis not present

## 2020-02-08 DIAGNOSIS — Z955 Presence of coronary angioplasty implant and graft: Secondary | ICD-10-CM

## 2020-02-09 NOTE — Progress Notes (Signed)
[facility] 69 y.o. male Nutrition Note  Visit Diagnosis: Acute ST elevation myocardial infarction (STEMI) of inferior wall (HCC)  S/P DES GRAFT X 2 12/06/19  Past Medical History:  Diagnosis Date  . Acute ST elevation myocardial infarction (STEMI) of inferior wall (HCC) 12/06/2019  . Asthma   . CAD (coronary artery disease)    a. 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 EF 65% at cath 8/08  . Coronary artery disease involving coronary bypass graft of native heart with angina pectoris [facility]) 12/07/2019   12/06/2019: Inferior STEMI-thrombotic occlusion of SVG-OM treated with rheolytic (AngioJet) thrombectomy and 2 site DES PCI.  Marland Kitchen HLD (hyperlipidemia)   . HTN (hypertension)   . STEMI (ST elevation myocardial infarction) (HCC) 12/06/2019  . Testosterone deficiency      Medications reviewed.   Current Outpatient Medications:  .  albuterol (PROVENTIL HFA;VENTOLIN HFA) 108 (90 Base) MCG/ACT inhaler, Inhale 2 puffs into the lungs every 4 (four) hours as needed for wheezing or shortness of breath. , Disp: , Rfl:  .  albuterol (PROVENTIL) (2.5 MG/3ML) 0.083% nebulizer solution, Take 2.5 mg by nebulization every 6 (six) hours as needed for wheezing or shortness of breath., Disp: , Rfl:  .  allopurinol (ZYLOPRIM) 300 MG tablet, Take 300 mg by mouth daily., Disp: , Rfl:  .  aspirin 81 MG EC tablet, Take 81 mg by mouth daily.  , Disp: , Rfl:  .  atorvastatin (LIPITOR) 80 MG tablet, TAKE 1 TABLET (80 MG TOTAL) BY MOUTH DAILY AT 6 PM., Disp: 90 tablet, Rfl: 1 .  bisoprolol (ZEBETA) 5 MG tablet, Take 0.5 tablets (2.5 mg total) by mouth daily., Disp: 45 tablet, Rfl: 3 .  ezetimibe (ZETIA) 10 MG tablet, Take 1 tablet (10 mg total) by mouth daily., Disp: 90 tablet, Rfl: 3 .  fexofenadine (ALLEGRA) 180 MG tablet, Take 180 mg by mouth daily as needed for allergies or rhinitis., Disp: , Rfl:  .  fluticasone furoate-vilanterol (BREO ELLIPTA) 100-25 MCG/INH AEPB, Inhale 1 puff  into the lungs as needed (SOB/wheezing). , Disp: , Rfl:  .  Melatonin 10 MG CAPS, Take 10 mg by mouth at bedtime as needed (for sleep)., Disp: , Rfl:  .  nitroGLYCERIN (NITROSTAT) 0.4 MG SL tablet, Place 1 tablet (0.4 mg total) under the tongue every 5 (five) minutes x 3 doses as needed for chest pain., Disp: 25 tablet, Rfl: 2 .  prasugrel (EFFIENT) 10 MG TABS tablet, Take 1 tablet (10 mg total) by mouth daily., Disp: 90 tablet, Rfl: 2   Ht Readings from Last 1 Encounters:  02/01/20 5\' 4"  (1.626 m)     Wt Readings from Last 3 Encounters:  02/01/20 202 lb (91.6 kg)  01/05/20 200 lb 9.9 oz (91 kg)  12/21/19 200 lb 1.9 oz (90.8 kg)     There is no height or weight on file to calculate BMI.   Social History   Tobacco Use  Smoking Status Former Smoker  Smokeless Tobacco Former 02/20/20  . Quit date: 10/13/1998     Lab Results  Component Value Date   CHOL 161 12/07/2019   Lab Results  Component Value Date   HDL 51 12/07/2019   Lab Results  Component Value Date   LDLCALC 87 12/07/2019   Lab Results  Component Value Date   TRIG 113 12/07/2019     Lab Results  Component Value Date   HGBA1C 5.8 (H) 12/06/2019  CBG (last 3)  No results for input(s): GLUCAP in the last 72 hours.   Nutrition Note  Spoke with pt. Nutrition Plan and Nutrition Survey goals reviewed with pt.   Pt has Pre-diabetes. Last A1c indicates blood glucose well-controlled.    Per discussion, pt does use canned/convenience foods often. Pt does not add salt to food. Pt does eat out frequently.  Pt has been through Cardiac Rehab in 2008. He continues to check labels and look for sodium <300 mg and fiber >2 g. He has reduced red meat, cheese, and sodas. Poor vegetable intake.   Pt expressed understanding of the information reviewed.    Nutrition Diagnosis ? Obese  I = 30-34.9 related to excessive energy intake as evidenced by a BMI 34.67 kg//m2  Nutrition Intervention ? Pt's individual nutrition  plan reviewed with pt. ? Benefits of adopting Heart Healthy diet discussed when Medficts reviewed.   ? Continue client-centered nutrition education by RD, as part of interdisciplinary care.  Goal(s) ? Pt to identify food quantities necessary to achieve weight loss of 6-24 lb at graduation from cardiac rehab.  ? Pt to build a healthy plate including vegetables, fruits, whole grains, and low-fat dairy products in a heart healthy meal plan.  Plan:   Will provide client-centered nutrition education as part of interdisciplinary care  Monitor and evaluate progress toward nutrition goal with team.   Michaele Offer, MS, RDN, LDN

## 2020-02-10 ENCOUNTER — Other Ambulatory Visit: Payer: Self-pay

## 2020-02-10 ENCOUNTER — Encounter (HOSPITAL_COMMUNITY)
Admission: RE | Admit: 2020-02-10 | Discharge: 2020-02-10 | Disposition: A | Discharge status: Home / Self Care | Payer: Medicare Other | Source: Ambulatory Visit | Attending: Cardiovascular Disease | Admitting: Cardiovascular Disease

## 2020-02-10 ENCOUNTER — Encounter (HOSPITAL_COMMUNITY): Payer: Medicare Other

## 2020-02-10 DIAGNOSIS — I2119 ST elevation (STEMI) myocardial infarction involving other coronary artery of inferior wall: Secondary | ICD-10-CM | POA: Diagnosis not present

## 2020-02-10 DIAGNOSIS — Z955 Presence of coronary angioplasty implant and graft: Secondary | ICD-10-CM

## 2020-02-13 ENCOUNTER — Encounter (HOSPITAL_COMMUNITY)
Admission: RE | Admit: 2020-02-13 | Discharge: 2020-02-13 | Disposition: A | Discharge status: Home / Self Care | Payer: Medicare Other | Source: Ambulatory Visit | Attending: Cardiovascular Disease | Admitting: Cardiovascular Disease

## 2020-02-13 ENCOUNTER — Encounter (HOSPITAL_COMMUNITY): Payer: Medicare Other

## 2020-02-13 DIAGNOSIS — Z7982 Long term (current) use of aspirin: Secondary | ICD-10-CM | POA: Insufficient documentation

## 2020-02-13 DIAGNOSIS — J45909 Unspecified asthma, uncomplicated: Secondary | ICD-10-CM | POA: Insufficient documentation

## 2020-02-13 DIAGNOSIS — Z7901 Long term (current) use of anticoagulants: Secondary | ICD-10-CM | POA: Insufficient documentation

## 2020-02-13 DIAGNOSIS — I2119 ST elevation (STEMI) myocardial infarction involving other coronary artery of inferior wall: Secondary | ICD-10-CM | POA: Insufficient documentation

## 2020-02-13 DIAGNOSIS — Z955 Presence of coronary angioplasty implant and graft: Secondary | ICD-10-CM | POA: Insufficient documentation

## 2020-02-13 DIAGNOSIS — Z79899 Other long term (current) drug therapy: Secondary | ICD-10-CM | POA: Insufficient documentation

## 2020-02-15 ENCOUNTER — Other Ambulatory Visit: Payer: Self-pay

## 2020-02-15 ENCOUNTER — Encounter (HOSPITAL_COMMUNITY)
Admission: RE | Admit: 2020-02-15 | Discharge: 2020-02-15 | Disposition: A | Discharge status: Home / Self Care | Payer: Medicare Other | Source: Ambulatory Visit | Attending: Cardiovascular Disease | Admitting: Cardiovascular Disease

## 2020-02-15 ENCOUNTER — Encounter (HOSPITAL_COMMUNITY): Payer: Medicare Other

## 2020-02-15 DIAGNOSIS — Z955 Presence of coronary angioplasty implant and graft: Secondary | ICD-10-CM

## 2020-02-15 DIAGNOSIS — Z7982 Long term (current) use of aspirin: Secondary | ICD-10-CM | POA: Diagnosis not present

## 2020-02-15 DIAGNOSIS — I2119 ST elevation (STEMI) myocardial infarction involving other coronary artery of inferior wall: Secondary | ICD-10-CM | POA: Diagnosis not present

## 2020-02-15 DIAGNOSIS — Z7901 Long term (current) use of anticoagulants: Secondary | ICD-10-CM | POA: Diagnosis not present

## 2020-02-15 DIAGNOSIS — J45909 Unspecified asthma, uncomplicated: Secondary | ICD-10-CM | POA: Diagnosis not present

## 2020-02-15 DIAGNOSIS — Z79899 Other long term (current) drug therapy: Secondary | ICD-10-CM | POA: Diagnosis not present

## 2020-02-16 ENCOUNTER — Other Ambulatory Visit: Payer: Self-pay | Admitting: Cardiovascular Disease

## 2020-02-16 NOTE — Progress Notes (Signed)
Cardiac Individual Treatment Plan  Patient Details  Name: Kevin Guzman MRN: [id number] Date of Birth: 1951/01/01 Referring Provider:     CARDIAC REHAB PHASE II ORIENTATION from 01/05/2020 in [name] CARDIAC REHAB  Referring Provider  Tonny Bollman MD      Initial Encounter Date:    CARDIAC REHAB PHASE II ORIENTATION from 01/05/2020 in [facility] CARDIAC REHAB  Date  01/05/20      Visit Diagnosis: Acute ST elevation myocardial infarction (STEMI) of inferior wall (HCC)  S/P DES GRAFT X 2 12/06/19  Patient's Home Medications on Admission:  Current Outpatient Medications:  .  albuterol (PROVENTIL HFA;VENTOLIN HFA) 108 (90 Base) MCG/ACT inhaler, Inhale 2 puffs into the lungs every 4 (four) hours as needed for wheezing or shortness of breath. , Disp: , Rfl:  .  albuterol (PROVENTIL) (2.5 MG/3ML) 0.083% nebulizer solution, Take 2.5 mg by nebulization every 6 (six) hours as needed for wheezing or shortness of breath., Disp: , Rfl:  .  allopurinol (ZYLOPRIM) 300 MG tablet, Take 300 mg by mouth daily., Disp: , Rfl:  .  aspirin 81 MG EC tablet, Take 81 mg by mouth daily.  , Disp: , Rfl:  .  atorvastatin (LIPITOR) 80 MG tablet, TAKE 1 TABLET (80 MG TOTAL) BY MOUTH DAILY AT 6 PM., Disp: 90 tablet, Rfl: 1 .  bisoprolol (ZEBETA) 5 MG tablet, Take 0.5 tablets (2.5 mg total) by mouth daily., Disp: 45 tablet, Rfl: 3 .  ezetimibe (ZETIA) 10 MG tablet, TAKE 1 TABLET BY MOUTH EVERY DAY, Disp: 90 tablet, Rfl: 1 .  fexofenadine (ALLEGRA) 180 MG tablet, Take 180 mg by mouth daily as needed for allergies or rhinitis., Disp: , Rfl:  .  fluticasone furoate-vilanterol (BREO ELLIPTA) 100-25 MCG/INH AEPB, Inhale 1 puff into the lungs as needed (SOB/wheezing). , Disp: , Rfl:  .  Melatonin 10 MG CAPS, Take 10 mg by mouth at bedtime as needed (for sleep)., Disp: , Rfl:  .  nitroGLYCERIN (NITROSTAT) 0.4 MG SL tablet, Place 1 tablet (0.4 mg total) under the tongue every 5  (five) minutes x 3 doses as needed for chest pain., Disp: 25 tablet, Rfl: 2 .  prasugrel (EFFIENT) 10 MG TABS tablet, Take 1 tablet (10 mg total) by mouth daily., Disp: 90 tablet, Rfl: 2  Past Medical History: Past Medical History:  Diagnosis Date  . Acute ST elevation myocardial infarction (STEMI) of inferior wall (HCC) 12/06/2019  . Asthma   . CAD (coronary artery disease)    a. 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 EF 65% at cath 8/08  . Coronary artery disease involving coronary bypass graft of native heart with angina pectoris [facility]) 12/07/2019   12/06/2019: Inferior STEMI-thrombotic occlusion of SVG-OM treated with rheolytic (AngioJet) thrombectomy and 2 site DES PCI.  Marland Kitchen HLD (hyperlipidemia)   . HTN (hypertension)   . STEMI (ST elevation myocardial infarction) (HCC) 12/06/2019  . Testosterone deficiency     Tobacco Use: Social History   Tobacco Use  Smoking Status Former Smoker  Smokeless Tobacco Former Neurosurgeon  . Quit date: 10/13/1998    Labs: Recent Review Flowsheet Data    Labs for ITP Cardiac and Pulmonary Rehab Latest Ref Rng & Units 01/27/2019 05/23/2019 09/13/2019 12/06/2019 12/07/2019   Cholestrol 0 - 200 mg/dL 409 [phone]   LDLCALC 0 - 99 mg/dL 213(Y) 99 71 86 87   LDLDIRECT mg/dL - - - - -  HDL >40 mg/dL 55 48 63 51 51   Trlycerides <150 mg/dL 135 159(H) 145 132 113   Hemoglobin A1c 4.8 - 5.6 % - - - 5.8(H) -      Capillary Blood Glucose: No results found for: GLUCAP   Exercise Target Goals: Exercise Program Goal: Individual exercise prescription set using results from initial 6 min walk test and THRR while considering  patient's activity barriers and safety.   Exercise Prescription Goal: Starting with aerobic activity 30 plus minutes a day, 3 days per week for initial exercise prescription. Provide home exercise prescription and guidelines that participant acknowledges understanding prior to discharge.  Activity Barriers & Risk  Stratification: Activity Barriers & Cardiac Risk Stratification - 01/05/20 1553      Activity Barriers & Cardiac Risk Stratification   Activity Barriers  Joint Problems    Cardiac Risk Stratification  High       6 Minute Walk: 6 Minute Walk    Row Name 01/05/20 1539         6 Minute Walk   Phase  Initial     Distance  1261 feet     Walk Time  6 minutes     # of Rest Breaks  0     MPH  2.4     METS  2.4     RPE  12     Perceived Dyspnea   0     VO2 Peak  8.3     Symptoms  No     Resting HR  82 bpm     Resting BP  104/60     Resting Oxygen Saturation   95 %     Exercise Oxygen Saturation  during 6 min walk  95 %     Max Ex. HR  93 bpm     Max Ex. BP  120/70     2 Minute Post BP  110/60        Oxygen Initial Assessment:   Oxygen Re-Evaluation:   Oxygen Discharge (Final Oxygen Re-Evaluation):   Initial Exercise Prescription: Initial Exercise Prescription - 01/05/20 1600      Date of Initial Exercise RX and Referring Provider   Date  01/05/20    Referring Provider  Sherren Mocha MD    Expected Discharge Date  03/16/20      Treadmill   MPH  1.6    Grade  1    Minutes  15    METs  2.4      NuStep   Level  2    SPM  85    Minutes  15    METs  2.2      Prescription Details   Frequency (times per week)  3x    Duration  Progress to 30 minutes of continuous aerobic without signs/symptoms of physical distress      Intensity   THRR 40-80% of Max Heartrate  61-122    Ratings of Perceived Exertion  11-13    Perceived Dyspnea  0-4      Progression   Progression  Continue progressive overload as per policy without signs/symptoms or physical distress.      Resistance Training   Training Prescription  Yes    Weight  3lbs    Reps  10-15       Perform Capillary Blood Glucose checks as needed.  Exercise Prescription Changes: Exercise Prescription Changes    Row Name 01/23/20 1511 02/10/20 1138  Response to Exercise   Blood Pressure  (Admit)  108/64  104/60      Blood Pressure (Exercise)  118/80  114/74      Blood Pressure (Exit)  108/82  110/64      Heart Rate (Admit)  69 bpm  81 bpm      Heart Rate (Exercise)  91 bpm  93 bpm      Heart Rate (Exit)  69 bpm  75 bpm      Rating of Perceived Exertion (Exercise)  13  13      Perceived Dyspnea (Exercise)  --  0      Symptoms  none  None      Comments  Off to a good start with exercise.   Pt tolerated exercise well      Duration  Progress to 30 minutes of  aerobic without signs/symptoms of physical distress  Progress to 30 minutes of  aerobic without signs/symptoms of physical distress      Intensity  THRR unchanged  THRR unchanged        Progression   Progression  Continue to progress workloads to maintain intensity without signs/symptoms of physical distress.  Continue to progress workloads to maintain intensity without signs/symptoms of physical distress.      Average METs  2.7  3        Resistance Training   Training Prescription  Yes  Yes      Weight  3lbs  4lbs      Reps  10-15  10-15      Time  10 Minutes  10 Minutes        Interval Training   Interval Training  No  No        Treadmill   MPH  2.3  2.3      Grade  0  2      Minutes  15  15      METs  2.76  3.39        NuStep   Level  2  3      SPM  85  90      Minutes  15  15      METs  2.9  2.7         Exercise Comments: Exercise Comments    Row Name 01/24/20 1515 02/16/20 0916         Exercise Comments  Patient tolerated low intensity exercise well without symptoms.  Pt is continuing to respond well to exercise prescription and tolerating workload increases well. Will continue to monitor and follow up with pt regarding home exercise plan.         Exercise Goals and Review: Exercise Goals    Row Name 01/05/20 1550             Exercise Goals   Increase Physical Activity  Yes       Intervention  Provide advice, education, support and counseling about physical activity/exercise  needs.;Develop an individualized exercise prescription for aerobic and resistive training based on initial evaluation findings, risk stratification, comorbidities and participant's personal goals.       Expected Outcomes  Short Term: Attend rehab on a regular basis to increase amount of physical activity.;Long Term: Add in home exercise to make exercise part of routine and to increase amount of physical activity.;Long Term: Exercising regularly at least 3-5 days a week.       Increase Strength and Stamina  Yes  Intervention  Provide advice, education, support and counseling about physical activity/exercise needs.;Develop an individualized exercise prescription for aerobic and resistive training based on initial evaluation findings, risk stratification, comorbidities and participant's personal goals.       Expected Outcomes  Short Term: Increase workloads from initial exercise prescription for resistance, speed, and METs.;Short Term: Perform resistance training exercises routinely during rehab and add in resistance training at home;Long Term: Improve cardiorespiratory fitness, muscular endurance and strength as measured by increased METs and functional capacity ( )       Able to understand and use rate of perceived exertion (RPE) scale  Yes       Intervention  Provide education and explanation on how to use RPE scale       Expected Outcomes  Short Term: Able to use RPE daily in rehab to express subjective intensity level;Long Term:  Able to use RPE to guide intensity level when exercising independently       Knowledge and understanding of Target Heart Rate Range (THRR)  Yes       Intervention  Provide education and explanation of THRR including how the numbers were predicted and where they are located for reference       Expected Outcomes  Short Term: Able to state/look up THRR;Long Term: Able to use THRR to govern intensity when exercising independently;Short Term: Able to use daily as guideline  for intensity in rehab       Able to check pulse independently  Yes       Intervention  Provide education and demonstration on how to check pulse in carotid and radial arteries.;Review the importance of being able to check your own pulse for safety during independent exercise       Expected Outcomes  Short Term: Able to explain why pulse checking is important during independent exercise;Long Term: Able to check pulse independently and accurately       Understanding of Exercise Prescription  Yes       Intervention  Provide education, explanation, and written materials on patient's individual exercise prescription       Expected Outcomes  Short Term: Able to explain program exercise prescription;Long Term: Able to explain home exercise prescription to exercise independently          Exercise Goals Re-Evaluation : Exercise Goals Re-Evaluation    Row Name 01/24/20 1515             Exercise Goal Re-Evaluation   Exercise Goals Review  Increase Physical Activity;Able to understand and use rate of perceived exertion (RPE) scale       Comments  Patient able to understand and use RPE scale appropriately.       Expected Outcomes  Increase workloads as tolerated to help improve cardiorespiratory fitness.           Discharge Exercise Prescription (Final Exercise Prescription Changes): Exercise Prescription Changes - 02/10/20 1138      Response to Exercise   Blood Pressure (Admit)  104/60    Blood Pressure (Exercise)  114/74    Blood Pressure (Exit)  110/64    Heart Rate (Admit)  81 bpm    Heart Rate (Exercise)  93 bpm    Heart Rate (Exit)  75 bpm    Rating of Perceived Exertion (Exercise)  13    Perceived Dyspnea (Exercise)  0    Symptoms  None    Comments  Pt tolerated exercise well    Duration  Progress to 30 minutes of  aerobic without  signs/symptoms of physical distress    Intensity  THRR unchanged      Progression   Progression  Continue to progress workloads to maintain intensity  without signs/symptoms of physical distress.    Average METs  3      Resistance Training   Training Prescription  Yes    Weight  4lbs    Reps  10-15    Time  10 Minutes      Interval Training   Interval Training  No      Treadmill   MPH  2.3    Grade  2    Minutes  15    METs  3.39      NuStep   Level  3    SPM  90    Minutes  15    METs  2.7       Nutrition:  Target Goals: Understanding of nutrition guidelines, daily intake of sodium 1500mg , cholesterol 200mg , calories 30% from fat and 7% or less from saturated fats, daily to have 5 or more servings of fruits and vegetables.  Biometrics: Pre Biometrics - 01/05/20 1549      Pre Biometrics   Height  5' 3.3" (1.608 m)    Weight  91 kg    Waist Circumference  44 inches    Hip Circumference  42 inches    Waist to Hip Ratio  1.05 %    BMI (Calculated)  35.19    Triceps Skinfold  15 mm    % Body Fat  32.9 %    Grip Strength  40 kg    Flexibility  20 in    Single Leg Stand  30 seconds        Nutrition Therapy Plan and Nutrition Goals: Nutrition Therapy & Goals - 02/09/20 0642      Nutrition Therapy   Diet  Heart Healthy      Personal Nutrition Goals   Nutrition Goal  Pt to identify food quantities necessary to achieve weight loss of 6-24 lb at graduation from cardiac rehab.    Personal Goal #2  Pt to build a healthy plate including vegetables, fruits, whole grains, and low-fat dairy products in a heart healthy meal plan.      Intervention Plan   Intervention  Nutrition handout(s) given to patient.;Prescribe, educate and counsel regarding individualized specific dietary modifications aiming towards targeted core components such as weight, hypertension, lipid management, diabetes, heart failure and other comorbidities.    Expected Outcomes  Short Term Goal: A plan has been developed with personal nutrition goals set during dietitian appointment.       Nutrition Assessments: Nutrition Assessments - 02/09/20  0643      MEDFICTS Scores   Pre Score  47       Nutrition Goals Re-Evaluation: Nutrition Goals Re-Evaluation    Row Name 02/09/20 0643             Goals   Current Weight  202 lb (91.6 kg)       Nutrition Goal  Pt to identify food quantities necessary to achieve weight loss of 6-24 lb at graduation from cardiac rehab.         Personal Goal #2 Re-Evaluation   Personal Goal #2  Pt to build a healthy plate including vegetables, fruits, whole grains, and low-fat dairy products in a heart healthy meal plan.          Nutrition Goals Discharge (Final Nutrition Goals Re-Evaluation): Nutrition Goals Re-Evaluation - 02/09/20  7026      Goals   Current Weight  202 lb (91.6 kg)    Nutrition Goal  Pt to identify food quantities necessary to achieve weight loss of 6-24 lb at graduation from cardiac rehab.      Personal Goal #2 Re-Evaluation   Personal Goal #2  Pt to build a healthy plate including vegetables, fruits, whole grains, and low-fat dairy products in a heart healthy meal plan.       Psychosocial: Target Goals: Acknowledge presence or absence of significant depression and/or stress, maximize coping skills, provide positive support system. Participant is able to verbalize types and ability to use techniques and skills needed for reducing stress and depression.  Initial Review & Psychosocial Screening: Initial Psych Review & Screening - 01/05/20 1534      Initial Review   Current issues with  None Identified      Family Dynamics   Good Support System?  Yes   Kevin Guzman has his wife for support     Barriers   Psychosocial barriers to participate in program  There are no identifiable barriers or psychosocial needs.      Screening Interventions   Interventions  Encouraged to exercise       Quality of Life Scores: Quality of Life - 01/05/20 1555      Quality of Life   Select  Quality of Life      Quality of Life Scores   Health/Function Pre  20.39 %    Socioeconomic Pre   23.33 %    Psych/Spiritual Pre  23.57 %    Family Pre  25.5 %    GLOBAL Pre  22.44 %      Scores of 19 and below usually indicate a poorer quality of life in these areas.  A difference of  2-3 points is a clinically meaningful difference.  A difference of 2-3 points in the total score of the Quality of Life Index has been associated with significant improvement in overall quality of life, self-image, physical symptoms, and general health in studies assessing change in quality of life.  PHQ-9: Recent Review Flowsheet Data    Depression screen [facility] 2/9 01/05/2020   Decreased Interest 0   Down, Depressed, Hopeless 0   PHQ - 2 Score 0     Interpretation of Total Score  Total Score Depression Severity:  1-4 = Minimal depression, 5-9 = Mild depression, 10-14 = Moderate depression, 15-19 = Moderately severe depression, 20-27 = Severe depression   Psychosocial Evaluation and Intervention:   Psychosocial Re-Evaluation: Psychosocial Re-Evaluation    Row Name 01/23/20 1612 02/16/20 1602           Psychosocial Re-Evaluation   Current issues with  None Identified  None Identified      Interventions  Encouraged to attend Cardiac Rehabilitation for the exercise  Encouraged to attend Cardiac Rehabilitation for the exercise      Continue Psychosocial Services   No Follow up required  No Follow up required         Psychosocial Discharge (Final Psychosocial Re-Evaluation): Psychosocial Re-Evaluation - 02/16/20 1602      Psychosocial Re-Evaluation   Current issues with  None Identified    Interventions  Encouraged to attend Cardiac Rehabilitation for the exercise    Continue Psychosocial Services   No Follow up required       Vocational Rehabilitation: Provide vocational rehab assistance to qualifying candidates.   Vocational Rehab Evaluation & Intervention: Vocational Rehab - 01/05/20 1535  Initial Vocational Rehab Evaluation & Intervention   Assessment shows need for Vocational  Rehabilitation  No       Education: Education Goals: Education classes will be provided on a weekly basis, covering required topics. Participant will state understanding/return demonstration of topics presented.  Learning Barriers/Preferences: Learning Barriers/Preferences - 01/05/20 1554      Learning Barriers/Preferences   Learning Barriers  Sight    Learning Preferences  Written Material;Verbal Instruction;Individual Instruction       Education Topics: Hypertension, Hypertension Reduction -Define heart disease and high blood pressure. Discus how high blood pressure affects the body and ways to reduce high blood pressure.   Exercise and Your Heart -Discuss why it is important to exercise, the FITT principles of exercise, normal and abnormal responses to exercise, and how to exercise safely.   Angina -Discuss definition of angina, causes of angina, treatment of angina, and how to decrease risk of having angina.   Cardiac Medications -Review what the following cardiac medications are used for, how they affect the body, and side effects that may occur when taking the medications.  Medications include Aspirin, Beta blockers, calcium channel blockers, ACE Inhibitors, angiotensin receptor blockers, diuretics, digoxin, and antihyperlipidemics.   Congestive Heart Failure -Discuss the definition of CHF, how to live with CHF, the signs and symptoms of CHF, and how keep track of weight and sodium intake.   Heart Disease and Intimacy -Discus the effect sexual activity has on the heart, how changes occur during intimacy as we age, and safety during sexual activity.   Smoking Cessation / COPD -Discuss different methods to quit smoking, the health benefits of quitting smoking, and the definition of COPD.   Nutrition I: Fats -Discuss the types of cholesterol, what cholesterol does to the heart, and how cholesterol levels can be controlled.   Nutrition II: Labels -Discuss the  different components of food labels and how to read food label   Heart Parts/Heart Disease and PAD -Discuss the anatomy of the heart, the pathway of blood circulation through the heart, and these are affected by heart disease.   Stress I: Signs and Symptoms -Discuss the causes of stress, how stress may lead to anxiety and depression, and ways to limit stress.   Stress II: Relaxation -Discuss different types of relaxation techniques to limit stress.   Warning Signs of Stroke / TIA -Discuss definition of a stroke, what the signs and symptoms are of a stroke, and how to identify when someone is having stroke.   Knowledge Questionnaire Score: Knowledge Questionnaire Score - 01/05/20 1553      Knowledge Questionnaire Score   Pre Score  20/24       Core Components/Risk Factors/Patient Goals at Admission: Personal Goals and Risk Factors at Admission - 01/05/20 1628      Core Components/Risk Factors/Patient Goals on Admission    Weight Management  Yes;Weight Maintenance;Obesity;Weight Loss    Intervention  Weight Management: Develop a combined nutrition and exercise program designed to reach desired caloric intake, while maintaining appropriate intake of nutrient and fiber, sodium and fats, and appropriate energy expenditure required for the weight goal.;Weight Management: Provide education and appropriate resources to help participant work on and attain dietary goals.;Weight Management/Obesity: Establish reasonable short term and long term weight goals.    Hypertension  Yes    Intervention  Provide education on lifestyle modifcations including regular physical activity/exercise, weight management, moderate sodium restriction and increased consumption of fresh fruit, vegetables, and low fat dairy, alcohol moderation, and smoking cessation.;Monitor  prescription use compliance.    Expected Outcomes  Short Term: Continued assessment and intervention until BP is < 140/80mm HG in hypertensive  participants. < 130/68mm HG in hypertensive participants with diabetes, heart failure or chronic kidney disease.;Long Term: Maintenance of blood pressure at goal levels.    Lipids  Yes    Intervention  Provide education and support for participant on nutrition & aerobic/resistive exercise along with prescribed medications to achieve LDL 70mg , HDL >40mg .    Expected Outcomes  Short Term: Participant states understanding of desired cholesterol values and is compliant with medications prescribed. Participant is following exercise prescription and nutrition guidelines.;Long Term: Cholesterol controlled with medications as prescribed, with individualized exercise RX and with personalized nutrition plan. Value goals: LDL < , HDL > 40 mg.       Core Components/Risk Factors/Patient Goals Review:  Goals and Risk Factor Review    Row Name 01/23/20 1612 02/16/20 1602           Core Components/Risk Factors/Patient Goals Review   Personal Goals Review  Weight Management/Obesity;Hypertension;Lipids  Weight Management/Obesity;Hypertension;Lipids      Review  Kevin Guzman started exercise on 01/23/20 and tolerated his first day of exercise without difficulty  Kevin Guzman has done well with exercise. Kevin Guzman's vital signs have been stable      Expected Outcomes  Kevin Guzman will continue tonpartcicpate in phase 2 cardiac rehab for exercise, nutrition and lifestyle modifications  Kevin Guzman will continue tonpartcicpate in phase 2 cardiac rehab for exercise, nutrition and lifestyle modifications         Core Components/Risk Factors/Patient Goals at Discharge (Final Review):  Goals and Risk Factor Review - 02/16/20 1602      Core Components/Risk Factors/Patient Goals Review   Personal Goals Review  Weight Management/Obesity;Hypertension;Lipids    Review  Kevin Guzman has done well with exercise. Kevin Guzman's vital signs have been stable    Expected Outcomes  Kevin Guzman will continue tonpartcicpate in phase 2 cardiac rehab for exercise, nutrition and  lifestyle modifications       ITP Comments: ITP Comments    Row Name 01/05/20 1526 01/19/20 1659 01/23/20 1609 02/16/20 1601     ITP Comments  Dr Armanda Magic MD, Medical Director  30 Day ITP Review. Patient is to start exercise on 01/23/20  Kevin Guzman started exercise on 01/23/20 and tolerated exercise without difficulty.  30 Day ITP Review. Patient is with good attendance and partcipation in phase 2 cardiac rehab.       Comments: See ITP Review.Gladstone Lighter, RN,BSN 02/16/2020 4:05 PM

## 2020-02-17 ENCOUNTER — Encounter (HOSPITAL_COMMUNITY): Payer: Medicare Other

## 2020-02-17 ENCOUNTER — Encounter (HOSPITAL_COMMUNITY)
Admission: RE | Admit: 2020-02-17 | Discharge: 2020-02-17 | Disposition: A | Discharge status: Home / Self Care | Payer: Medicare Other | Source: Ambulatory Visit | Attending: Cardiovascular Disease | Admitting: Cardiovascular Disease

## 2020-02-17 ENCOUNTER — Other Ambulatory Visit: Payer: Self-pay

## 2020-02-17 DIAGNOSIS — Z955 Presence of coronary angioplasty implant and graft: Secondary | ICD-10-CM

## 2020-02-17 DIAGNOSIS — I2119 ST elevation (STEMI) myocardial infarction involving other coronary artery of inferior wall: Secondary | ICD-10-CM | POA: Diagnosis not present

## 2020-02-20 ENCOUNTER — Encounter (HOSPITAL_COMMUNITY): Payer: Medicare Other

## 2020-02-20 ENCOUNTER — Other Ambulatory Visit: Payer: Self-pay

## 2020-02-20 ENCOUNTER — Encounter (HOSPITAL_COMMUNITY)
Admission: RE | Admit: 2020-02-20 | Discharge: 2020-02-20 | Disposition: A | Discharge status: Home / Self Care | Payer: Medicare Other | Source: Ambulatory Visit | Attending: Cardiovascular Disease | Admitting: Cardiovascular Disease

## 2020-02-20 DIAGNOSIS — Z955 Presence of coronary angioplasty implant and graft: Secondary | ICD-10-CM

## 2020-02-20 DIAGNOSIS — I2119 ST elevation (STEMI) myocardial infarction involving other coronary artery of inferior wall: Secondary | ICD-10-CM

## 2020-02-21 NOTE — Progress Notes (Signed)
Reviewed home exercise program with patient. Pt voices not currently walking at home on off days from C/R. Importance of walking at home discussed. Components of safe exercise reviewed. Encouraged to always take prescribed medications prior to engaging in physical activity Streesed to carry cell phone and ID. Appropriate clothing/shoes choices discussed. Weather parameters for temperature and humidity reviewed and hydration encouraged before during and after exercise. THR range of 61-122 reviewed and the rationale for the RPE scale rating of 11-13 with activity discussed. Warm up and cool-down encouraged. Encouraged to walk for 30-45/minutes 2-3x/wk. Pt voices He has prescription for NTG that he has not filled. Stressed to pt that he must get the NTG filled and carry with him at all times. Reviewed safe and proper use of NTG. Also reviewed S/S that would require patient to stop exercise. Instructed that if symptoms occur and resolve he can call his physician. If they do not resolve to call 911. Pt verbalized understanding of exercise prescription. Pt was provided copy.   Lorin Picket, MS, ACSM EP-C, CCRP

## 2020-02-22 ENCOUNTER — Encounter (HOSPITAL_COMMUNITY): Payer: Medicare Other

## 2020-02-22 ENCOUNTER — Encounter (HOSPITAL_COMMUNITY)
Admission: RE | Admit: 2020-02-22 | Discharge: 2020-02-22 | Disposition: A | Discharge status: Home / Self Care | Payer: Medicare Other | Source: Ambulatory Visit | Attending: Cardiovascular Disease | Admitting: Cardiovascular Disease

## 2020-02-22 ENCOUNTER — Other Ambulatory Visit: Payer: Self-pay

## 2020-02-22 DIAGNOSIS — I2119 ST elevation (STEMI) myocardial infarction involving other coronary artery of inferior wall: Secondary | ICD-10-CM

## 2020-02-22 DIAGNOSIS — Z955 Presence of coronary angioplasty implant and graft: Secondary | ICD-10-CM

## 2020-02-22 NOTE — Progress Notes (Signed)
Kevin Guzman felt like he was having an asthma flare up. Kevin Guzman said he felt so good earlier he did not take his Breo inhaler. Had Kevin Guzman work at a lower work load this afternoon. Upon assessment lung fields clear upon ascultation. Patient denies shortness of breath. Reminded  Kevin Guzman to bring his rescue inhaler to exercise as Kevin Guzman did not bring his rescue inhaler today. Oxygen saturation 98% on room air. No complaints upon exit from cardiac rehab. Will continue to monitor the patient throughout  the program.Janit Cutter Harlon Flor, RN,BSN 02/22/2020 4:27 PM

## 2020-02-24 ENCOUNTER — Encounter (HOSPITAL_COMMUNITY): Payer: Medicare Other

## 2020-02-27 ENCOUNTER — Encounter (HOSPITAL_COMMUNITY): Payer: Medicare Other

## 2020-02-27 ENCOUNTER — Encounter: Payer: Self-pay | Admitting: Physician Assistant

## 2020-02-27 ENCOUNTER — Other Ambulatory Visit: Payer: Self-pay | Admitting: Cardiology

## 2020-02-27 ENCOUNTER — Other Ambulatory Visit: Payer: Self-pay

## 2020-02-27 ENCOUNTER — Encounter (HOSPITAL_COMMUNITY)
Admission: RE | Admit: 2020-02-27 | Discharge: 2020-02-27 | Disposition: A | Discharge status: Home / Self Care | Payer: Medicare Other | Source: Ambulatory Visit | Attending: Cardiovascular Disease | Admitting: Cardiovascular Disease

## 2020-02-27 DIAGNOSIS — I2119 ST elevation (STEMI) myocardial infarction involving other coronary artery of inferior wall: Secondary | ICD-10-CM | POA: Diagnosis not present

## 2020-02-27 DIAGNOSIS — Z955 Presence of coronary angioplasty implant and graft: Secondary | ICD-10-CM

## 2020-02-29 ENCOUNTER — Other Ambulatory Visit: Payer: Self-pay

## 2020-02-29 ENCOUNTER — Encounter (HOSPITAL_COMMUNITY)
Admission: RE | Admit: 2020-02-29 | Discharge: 2020-02-29 | Disposition: A | Discharge status: Home / Self Care | Payer: Medicare Other | Source: Ambulatory Visit | Attending: Cardiovascular Disease | Admitting: Cardiovascular Disease

## 2020-02-29 ENCOUNTER — Encounter (HOSPITAL_COMMUNITY): Payer: Medicare Other

## 2020-02-29 DIAGNOSIS — I2119 ST elevation (STEMI) myocardial infarction involving other coronary artery of inferior wall: Secondary | ICD-10-CM

## 2020-02-29 DIAGNOSIS — Z955 Presence of coronary angioplasty implant and graft: Secondary | ICD-10-CM

## 2020-03-02 ENCOUNTER — Encounter (HOSPITAL_COMMUNITY): Payer: Medicare Other

## 2020-03-02 ENCOUNTER — Other Ambulatory Visit: Payer: Self-pay

## 2020-03-02 ENCOUNTER — Encounter (HOSPITAL_COMMUNITY)
Admission: RE | Admit: 2020-03-02 | Discharge: 2020-03-02 | Disposition: A | Discharge status: Home / Self Care | Payer: Medicare Other | Source: Ambulatory Visit | Attending: Cardiovascular Disease | Admitting: Cardiovascular Disease

## 2020-03-02 DIAGNOSIS — I2119 ST elevation (STEMI) myocardial infarction involving other coronary artery of inferior wall: Secondary | ICD-10-CM | POA: Diagnosis not present

## 2020-03-02 DIAGNOSIS — Z955 Presence of coronary angioplasty implant and graft: Secondary | ICD-10-CM

## 2020-03-05 ENCOUNTER — Encounter (HOSPITAL_COMMUNITY)
Admission: RE | Admit: 2020-03-05 | Discharge: 2020-03-05 | Disposition: A | Discharge status: Home / Self Care | Payer: Medicare Other | Source: Ambulatory Visit | Attending: Cardiovascular Disease | Admitting: Cardiovascular Disease

## 2020-03-05 ENCOUNTER — Encounter (HOSPITAL_COMMUNITY): Payer: Medicare Other

## 2020-03-05 ENCOUNTER — Other Ambulatory Visit: Payer: Self-pay

## 2020-03-05 DIAGNOSIS — I2119 ST elevation (STEMI) myocardial infarction involving other coronary artery of inferior wall: Secondary | ICD-10-CM | POA: Diagnosis not present

## 2020-03-05 DIAGNOSIS — Z955 Presence of coronary angioplasty implant and graft: Secondary | ICD-10-CM

## 2020-03-07 ENCOUNTER — Other Ambulatory Visit: Payer: Self-pay

## 2020-03-07 ENCOUNTER — Encounter (HOSPITAL_COMMUNITY): Payer: Medicare Other

## 2020-03-07 ENCOUNTER — Encounter (HOSPITAL_COMMUNITY)
Admission: RE | Admit: 2020-03-07 | Discharge: 2020-03-07 | Disposition: A | Discharge status: Home / Self Care | Payer: Medicare Other | Source: Ambulatory Visit | Attending: Cardiovascular Disease | Admitting: Cardiovascular Disease

## 2020-03-07 DIAGNOSIS — Z955 Presence of coronary angioplasty implant and graft: Secondary | ICD-10-CM

## 2020-03-07 DIAGNOSIS — I2119 ST elevation (STEMI) myocardial infarction involving other coronary artery of inferior wall: Secondary | ICD-10-CM | POA: Diagnosis not present

## 2020-03-09 ENCOUNTER — Encounter (HOSPITAL_COMMUNITY)
Admission: RE | Admit: 2020-03-09 | Discharge: 2020-03-09 | Disposition: A | Discharge status: Home / Self Care | Payer: Medicare Other | Source: Ambulatory Visit | Attending: Cardiovascular Disease | Admitting: Cardiovascular Disease

## 2020-03-09 ENCOUNTER — Other Ambulatory Visit: Payer: Self-pay

## 2020-03-09 ENCOUNTER — Encounter (HOSPITAL_COMMUNITY): Payer: Medicare Other

## 2020-03-09 DIAGNOSIS — Z955 Presence of coronary angioplasty implant and graft: Secondary | ICD-10-CM

## 2020-03-09 DIAGNOSIS — I2119 ST elevation (STEMI) myocardial infarction involving other coronary artery of inferior wall: Secondary | ICD-10-CM

## 2020-03-14 ENCOUNTER — Encounter (HOSPITAL_COMMUNITY): Payer: Medicare Other

## 2020-03-14 ENCOUNTER — Other Ambulatory Visit: Payer: Self-pay

## 2020-03-14 ENCOUNTER — Encounter (HOSPITAL_COMMUNITY)
Admission: RE | Admit: 2020-03-14 | Discharge: 2020-03-14 | Disposition: A | Discharge status: Home / Self Care | Payer: Medicare Other | Source: Ambulatory Visit | Attending: Cardiovascular Disease | Admitting: Cardiovascular Disease

## 2020-03-14 VITALS — Ht 63.39 in | Wt 204.6 lb

## 2020-03-14 DIAGNOSIS — Z79899 Other long term (current) drug therapy: Secondary | ICD-10-CM | POA: Insufficient documentation

## 2020-03-14 DIAGNOSIS — J45909 Unspecified asthma, uncomplicated: Secondary | ICD-10-CM | POA: Insufficient documentation

## 2020-03-14 DIAGNOSIS — Z7982 Long term (current) use of aspirin: Secondary | ICD-10-CM | POA: Insufficient documentation

## 2020-03-14 DIAGNOSIS — Z7901 Long term (current) use of anticoagulants: Secondary | ICD-10-CM | POA: Insufficient documentation

## 2020-03-14 DIAGNOSIS — I2119 ST elevation (STEMI) myocardial infarction involving other coronary artery of inferior wall: Secondary | ICD-10-CM | POA: Insufficient documentation

## 2020-03-14 DIAGNOSIS — Z955 Presence of coronary angioplasty implant and graft: Secondary | ICD-10-CM | POA: Insufficient documentation

## 2020-03-15 NOTE — Progress Notes (Signed)
Cardiac Individual Treatment Plan  Patient Details  Name: Kennan Detter MRN: [id number] Date of Birth: January 23, 1951 Referring Provider:     CARDIAC REHAB PHASE II ORIENTATION from 01/05/2020 in [name] CARDIAC REHAB  Referring Provider  Tonny Bollman MD      Initial Encounter Date:    CARDIAC REHAB PHASE II ORIENTATION from 01/05/2020 in [facility] CARDIAC REHAB  Date  01/05/20      Visit Diagnosis: Acute ST elevation myocardial infarction (STEMI) of inferior wall (HCC)  S/P DES GRAFT X 2 12/06/19  Patient's Home Medications on Admission:  Current Outpatient Medications:  .  albuterol (PROVENTIL HFA;VENTOLIN HFA) 108 (90 Base) MCG/ACT inhaler, Inhale 2 puffs into the lungs every 4 (four) hours as needed for wheezing or shortness of breath. , Disp: , Rfl:  .  albuterol (PROVENTIL) (2.5 MG/3ML) 0.083% nebulizer solution, Take 2.5 mg by nebulization every 6 (six) hours as needed for wheezing or shortness of breath., Disp: , Rfl:  .  allopurinol (ZYLOPRIM) 300 MG tablet, Take 300 mg by mouth daily., Disp: , Rfl:  .  aspirin 81 MG EC tablet, Take 81 mg by mouth daily.  , Disp: , Rfl:  .  atorvastatin (LIPITOR) 80 MG tablet, TAKE 1 TABLET (80 MG TOTAL) BY MOUTH DAILY AT 6 PM., Disp: 90 tablet, Rfl: 1 .  bisoprolol (ZEBETA) 5 MG tablet, Take 0.5 tablets (2.5 mg total) by mouth daily., Disp: 45 tablet, Rfl: 3 .  ezetimibe (ZETIA) 10 MG tablet, TAKE 1 TABLET BY MOUTH EVERY DAY, Disp: 90 tablet, Rfl: 1 .  fexofenadine (ALLEGRA) 180 MG tablet, Take 180 mg by mouth daily as needed for allergies or rhinitis., Disp: , Rfl:  .  fluticasone furoate-vilanterol (BREO ELLIPTA) 100-25 MCG/INH AEPB, Inhale 1 puff into the lungs as needed (SOB/wheezing). , Disp: , Rfl:  .  Melatonin 10 MG CAPS, Take 10 mg by mouth at bedtime as needed (for sleep)., Disp: , Rfl:  .  nitroGLYCERIN (NITROSTAT) 0.4 MG SL tablet, Place 1 tablet (0.4 mg total) under the tongue every 5  (five) minutes x 3 doses as needed for chest pain., Disp: 25 tablet, Rfl: 2 .  prasugrel (EFFIENT) 10 MG TABS tablet, TAKE 1 TABLET BY MOUTH EVERY DAY, Disp: 90 tablet, Rfl: 1  Past Medical History: Past Medical History:  Diagnosis Date  . Acute ST elevation myocardial infarction (STEMI) of inferior wall (HCC) 12/06/2019  . Asthma   . CAD (coronary artery disease)    a. 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 EF 65% at cath 8/08  . Coronary artery disease involving coronary bypass graft of native heart with angina pectoris [facility]) 12/07/2019   12/06/2019: Inferior STEMI-thrombotic occlusion of SVG-OM treated with rheolytic (AngioJet) thrombectomy and 2 site DES PCI.  Marland Kitchen History of cardiac monitoring    Cardiac Monitor 02/2020: NSR, avg HR 65, no PVCs, no AF  . HLD (hyperlipidemia)   . HTN (hypertension)   . STEMI (ST elevation myocardial infarction) (HCC) 12/06/2019  . Testosterone deficiency     Tobacco Use: Social History   Tobacco Use  Smoking Status Former Smoker  Smokeless Tobacco Former Neurosurgeon  . Quit date: 10/13/1998    Labs: Recent Review Flowsheet Data    Labs for ITP Cardiac and Pulmonary Rehab Latest Ref Rng & Units 01/27/2019 05/23/2019 09/13/2019 12/06/2019 12/07/2019   Cholestrol 0 - 200 mg/dL 409 811 [phone]   LDLCALC 0 -  99 mg/dL 222(L) 99 71 86 87   LDLDIRECT mg/dL - - - - -   HDL >79 mg/dL 55 48 63 51 51   Trlycerides <150 mg/dL 892 119(E) 174 081 448   Hemoglobin A1c 4.8 - 5.6 % - - - 5.8(H) -      Capillary Blood Glucose: No results found for: GLUCAP   Exercise Target Goals: Exercise Program Goal: Individual exercise prescription set using results from initial 6 min walk test and THRR while considering  patient's activity barriers and safety.   Exercise Prescription Goal: Starting with aerobic activity 30 plus minutes a day, 3 days per week for initial exercise prescription. Provide home exercise prescription and guidelines that  participant acknowledges understanding prior to discharge.  Activity Barriers & Risk Stratification: Activity Barriers & Cardiac Risk Stratification - 01/05/20 1553      Activity Barriers & Cardiac Risk Stratification   Activity Barriers  Joint Problems    Cardiac Risk Stratification  High       6 Minute Walk: 6 Minute Walk    Row Name 01/05/20 1539 03/14/20 1640       6 Minute Walk   Phase  Initial  Discharge    Distance  1261 feet  1630 feet    Distance % Change  --  29.26 %    Distance Feet Change  --  369 ft    Walk Time  6 minutes  6 minutes    # of Rest Breaks  0  0    MPH  2.4  3.09    METS  2.4  3.3    RPE  12  14    Perceived Dyspnea   0  0    VO2 Peak  8.3  11.7    Symptoms  No  No    Resting HR  82 bpm  65 bpm    Resting BP  104/60  122/68    Resting Oxygen Saturation   95 %  --    Exercise Oxygen Saturation  during 6 min walk  95 %  --    Max Ex. HR  93 bpm  121 bpm    Max Ex. BP  120/70  132/80    2 Minute Post BP  110/60  118/70       Oxygen Initial Assessment:   Oxygen Re-Evaluation:   Oxygen Discharge (Final Oxygen Re-Evaluation):   Initial Exercise Prescription: Initial Exercise Prescription - 01/05/20 1600      Date of Initial Exercise RX and Referring Provider   Date  01/05/20    Referring Provider  Tonny Bollman MD    Expected Discharge Date  03/16/20      Treadmill   MPH  1.6    Grade  1    Minutes  15    METs  2.4      NuStep   Level  2    SPM  85    Minutes  15    METs  2.2      Prescription Details   Frequency (times per week)  3x    Duration  Progress to 30 minutes of continuous aerobic without signs/symptoms of physical distress      Intensity   THRR 40-80% of Max Heartrate  61-122    Ratings of Perceived Exertion  11-13    Perceived Dyspnea  0-4      Progression   Progression  Continue progressive overload as per policy without signs/symptoms or physical distress.  Resistance Training   Training  Prescription  Yes    Weight  3lbs    Reps  10-15       Perform Capillary Blood Glucose checks as needed.  Exercise Prescription Changes: Exercise Prescription Changes    Row Name 01/23/20 1511 02/10/20 1138 02/20/20 0758 03/09/20 0755       Response to Exercise   Blood Pressure (Admit)  108/64  104/60  95/60  122/68    Blood Pressure (Exercise)  118/80  114/74  116/72  --    Blood Pressure (Exit)  108/82  110/64  112/80  132/80    Heart Rate (Admit)  69 bpm  81 bpm  73 bpm  65 bpm    Heart Rate (Exercise)  91 bpm  93 bpm  104 bpm  121 bpm    Heart Rate (Exit)  69 bpm  75 bpm  68 bpm  72 bpm    Rating of Perceived Exertion (Exercise)  13  13  11  14     Perceived Dyspnea (Exercise)  --  0  0  0    Symptoms  none  None  None  None    Comments  Off to a good start with exercise.   Pt tolerated exercise well  None  None    Duration  Progress to 30 minutes of  aerobic without signs/symptoms of physical distress  Progress to 30 minutes of  aerobic without signs/symptoms of physical distress  Continue with 30 min of aerobic exercise without signs/symptoms of physical distress.  Continue with 30 min of aerobic exercise without signs/symptoms of physical distress.    Intensity  THRR unchanged  THRR unchanged  THRR unchanged  THRR unchanged      Progression   Progression  Continue to progress workloads to maintain intensity without signs/symptoms of physical distress.  Continue to progress workloads to maintain intensity without signs/symptoms of physical distress.  Continue to progress workloads to maintain intensity without signs/symptoms of physical distress.  Continue to progress workloads to maintain intensity without signs/symptoms of physical distress.    Average METs  2.7  3  3.2  3.8      Resistance Training   Training Prescription  Yes  Yes  Yes  Yes    Weight  3lbs  4lbs  4lbs  4lbs    Reps  10-15  10-15  10-15  10-15    Time  10 Minutes  10 Minutes  10 Minutes  10 Minutes       Interval Training   Interval Training  No  No  No  No      Treadmill   MPH  2.3  2.3  2.3  2.3    Grade  0  2  2  2     Minutes  15  15  15  15     METs  2.76  3.39  3.39  3.39      NuStep   Level  2  3  3  4     SPM  85  90  85  95    Minutes  15  15  15  15     METs  2.9  2.7  2.4  4.2      Home Exercise Plan   Plans to continue exercise at  --  --  Home (comment) Walking  Home (comment) Walking    Frequency  --  --  Add 2 additional days to program exercise sessions.  Add 2  additional days to program exercise sessions.    Initial Home Exercises Provided  --  --  02/20/20  02/20/20       Exercise Comments: Exercise Comments    Row Name 01/24/20 1515 02/16/20 0916 02/20/20 1500 03/15/20 1526     Exercise Comments  Patient tolerated low intensity exercise well without symptoms.  Pt is continuing to respond well to exercise prescription and tolerating workload increases well. Will continue to monitor and follow up with pt regarding home exercise plan.  Reviwed home exercise prescription with patient. Provided encouragement to begin walking at home and given copy of the exercise prescription.  Pt is responding well to workload increases. Pt continues to put forth good effort with exercise. Pt due to graduate from cardiac rehab on 03/16/20. Will follow up with pt regrading plans for exercise post rehab.       Exercise Goals and Review: Exercise Goals    Row Name 01/05/20 1550             Exercise Goals   Increase Physical Activity  Yes       Intervention  Provide advice, education, support and counseling about physical activity/exercise needs.;Develop an individualized exercise prescription for aerobic and resistive training based on initial evaluation findings, risk stratification, comorbidities and participant's personal goals.       Expected Outcomes  Short Term: Attend rehab on a regular basis to increase amount of physical activity.;Long Term: Add in home exercise to make  exercise part of routine and to increase amount of physical activity.;Long Term: Exercising regularly at least 3-5 days a week.       Increase Strength and Stamina  Yes       Intervention  Provide advice, education, support and counseling about physical activity/exercise needs.;Develop an individualized exercise prescription for aerobic and resistive training based on initial evaluation findings, risk stratification, comorbidities and participant's personal goals.       Expected Outcomes  Short Term: Increase workloads from initial exercise prescription for resistance, speed, and METs.;Short Term: Perform resistance training exercises routinely during rehab and add in resistance training at home;Long Term: Improve cardiorespiratory fitness, muscular endurance and strength as measured by increased METs and functional capacity ( )       Able to understand and use rate of perceived exertion (RPE) scale  Yes       Intervention  Provide education and explanation on how to use RPE scale       Expected Outcomes  Short Term: Able to use RPE daily in rehab to express subjective intensity level;Long Term:  Able to use RPE to guide intensity level when exercising independently       Knowledge and understanding of Target Heart Rate Range (THRR)  Yes       Intervention  Provide education and explanation of THRR including how the numbers were predicted and where they are located for reference       Expected Outcomes  Short Term: Able to state/look up THRR;Long Term: Able to use THRR to govern intensity when exercising independently;Short Term: Able to use daily as guideline for intensity in rehab       Able to check pulse independently  Yes       Intervention  Provide education and demonstration on how to check pulse in carotid and radial arteries.;Review the importance of being able to check your own pulse for safety during independent exercise       Expected Outcomes  Short Term: Able to explain why  pulse  checking is important during independent exercise;Long Term: Able to check pulse independently and accurately       Understanding of Exercise Prescription  Yes       Intervention  Provide education, explanation, and written materials on patient's individual exercise prescription       Expected Outcomes  Short Term: Able to explain program exercise prescription;Long Term: Able to explain home exercise prescription to exercise independently          Exercise Goals Re-Evaluation : Exercise Goals Re-Evaluation    Row Name 01/24/20 1515 02/20/20 1500 03/15/20 1526         Exercise Goal Re-Evaluation   Exercise Goals Review  Increase Physical Activity;Able to understand and use rate of perceived exertion (RPE) scale  Increase Physical Activity;Increase Strength and Stamina;Able to understand and use rate of perceived exertion (RPE) scale;Knowledge and understanding of Target Heart Rate Range (THRR);Able to check pulse independently;Understanding of Exercise Prescription  Increase Physical Activity;Increase Strength and Stamina;Able to understand and use rate of perceived exertion (RPE) scale;Knowledge and understanding of Target Heart Rate Range (THRR);Able to check pulse independently;Understanding of Exercise Prescription     Comments  Patient able to understand and use RPE scale appropriately.  Reviewed home exercise prescription with patient. Discussed rationale to increase activity at home to agument phase 2 C/R and help develop long term exercise habits. Reviewed THR and and RPE scale and assisted patient with taking pulse so he can do so independently. Pt not currently walking at home. Provided copy of prescription.  Pt is responding very well to workload increases. Pt is able to exercise 30 minutes with minimal difficulty.     Expected Outcomes  Increase workloads as tolerated to help improve cardiorespiratory fitness.  Pt will begin to walk at home 2-3 days/wk for 30-45 minutes and follow the  guidelines contained in the exercise prescription.  Pt will continue walk 2-3 days a week for 30-45 minutes.         Discharge Exercise Prescription (Final Exercise Prescription Changes): Exercise Prescription Changes - 03/09/20 0755      Response to Exercise   Blood Pressure (Admit)  122/68    Blood Pressure (Exit)  132/80    Heart Rate (Admit)  65 bpm    Heart Rate (Exercise)  121 bpm    Heart Rate (Exit)  72 bpm    Rating of Perceived Exertion (Exercise)  14    Perceived Dyspnea (Exercise)  0    Symptoms  None    Comments  None    Duration  Continue with 30 min of aerobic exercise without signs/symptoms of physical distress.    Intensity  THRR unchanged      Progression   Progression  Continue to progress workloads to maintain intensity without signs/symptoms of physical distress.    Average METs  3.8      Resistance Training   Training Prescription  Yes    Weight  4lbs    Reps  10-15    Time  10 Minutes      Interval Training   Interval Training  No      Treadmill   MPH  2.3    Grade  2    Minutes  15    METs  3.39      NuStep   Level  4    SPM  95    Minutes  15    METs  4.2      Home Exercise Plan  Plans to continue exercise at  Home (comment)   Walking   Frequency  Add 2 additional days to program exercise sessions.    Initial Home Exercises Provided  02/20/20       Nutrition:  Target Goals: Understanding of nutrition guidelines, daily intake of sodium 1500mg , cholesterol 200mg , calories 30% from fat and 7% or less from saturated fats, daily to have 5 or more servings of fruits and vegetables.  Biometrics: Pre Biometrics - 01/05/20 1549      Pre Biometrics   Height  5' 3.3" (1.608 m)    Weight  91 kg    Waist Circumference  44 inches    Hip Circumference  42 inches    Waist to Hip Ratio  1.05 %    BMI (Calculated)  35.19    Triceps Skinfold  15 mm    % Body Fat  32.9 %    Grip Strength  40 kg    Flexibility  20 in    Single Leg Stand   30 seconds      Post Biometrics - 03/14/20 1641       Post  Biometrics   Height  5' 3.39" (1.61 m)    Weight  92.8 kg    Waist Circumference  44 inches    Hip Circumference  43 inches    Waist to Hip Ratio  1.02 %    BMI (Calculated)  35.8    Triceps Skinfold  16 mm    % Body Fat  33.5 %    Grip Strength  43 kg    Flexibility  18 in    Single Leg Stand  31.31 seconds       Nutrition Therapy Plan and Nutrition Goals: Nutrition Therapy & Goals - 02/09/20 0642      Nutrition Therapy   Diet  Heart Healthy      Personal Nutrition Goals   Nutrition Goal  Pt to identify food quantities necessary to achieve weight loss of 6-24 lb at graduation from cardiac rehab.    Personal Goal #2  Pt to build a healthy plate including vegetables, fruits, whole grains, and low-fat dairy products in a heart healthy meal plan.      Intervention Plan   Intervention  Nutrition handout(s) given to patient.;Prescribe, educate and counsel regarding individualized specific dietary modifications aiming towards targeted core components such as weight, hypertension, lipid management, diabetes, heart failure and other comorbidities.    Expected Outcomes  Short Term Goal: A plan has been developed with personal nutrition goals set during dietitian appointment.       Nutrition Assessments: Nutrition Assessments - 02/09/20 0643      MEDFICTS Scores   Pre Score  47       Nutrition Goals Re-Evaluation: Nutrition Goals Re-Evaluation    Row Name 02/09/20 0643 03/14/20 1513           Goals   Current Weight  202 lb (91.6 kg)  202 lb (91.6 kg)      Nutrition Goal  Pt to identify food quantities necessary to achieve weight loss of 6-24 lb at graduation from cardiac rehab.  Pt to identify food quantities necessary to achieve weight loss of 6-24 lb at graduation from cardiac rehab.        Personal Goal #2 Re-Evaluation   Personal Goal #2  Pt to build a healthy plate including vegetables, fruits, whole  grains, and low-fat dairy products in a heart healthy meal plan.  Pt to  build a healthy plate including vegetables, fruits, whole grains, and low-fat dairy products in a heart healthy meal plan.         Nutrition Goals Discharge (Final Nutrition Goals Re-Evaluation): Nutrition Goals Re-Evaluation - 03/14/20 1513      Goals   Current Weight  202 lb (91.6 kg)    Nutrition Goal  Pt to identify food quantities necessary to achieve weight loss of 6-24 lb at graduation from cardiac rehab.      Personal Goal #2 Re-Evaluation   Personal Goal #2  Pt to build a healthy plate including vegetables, fruits, whole grains, and low-fat dairy products in a heart healthy meal plan.       Psychosocial: Target Goals: Acknowledge presence or absence of significant depression and/or stress, maximize coping skills, provide positive support system. Participant is able to verbalize types and ability to use techniques and skills needed for reducing stress and depression.  Initial Review & Psychosocial Screening: Initial Psych Review & Screening - 01/05/20 1534      Initial Review   Current issues with  None Identified      Family Dynamics   Good Support System?  Yes   [name] has his wife for support     Barriers   Psychosocial barriers to participate in program  There are no identifiable barriers or psychosocial needs.      Screening Interventions   Interventions  Encouraged to exercise       Quality of Life Scores: Quality of Life - 01/05/20 1555      Quality of Life   Select  Quality of Life      Quality of Life Scores   Health/Function Pre  20.39 %    Socioeconomic Pre  23.33 %    Psych/Spiritual Pre  23.57 %    Family Pre  25.5 %    GLOBAL Pre  22.44 %      Scores of 19 and below usually indicate a poorer quality of life in these areas.  A difference of  2-3 points is a clinically meaningful difference.  A difference of 2-3 points in the total score of the Quality of Life Index has been  associated with significant improvement in overall quality of life, self-image, physical symptoms, and general health in studies assessing change in quality of life.  PHQ-9: Recent Review Flowsheet Data    Depression screen [facility] 2/9 01/05/2020   Decreased Interest 0   Down, Depressed, Hopeless 0   PHQ - 2 Score 0     Interpretation of Total Score  Total Score Depression Severity:  1-4 = Minimal depression, 5-9 = Mild depression, 10-14 = Moderate depression, 15-19 = Moderately severe depression, 20-27 = Severe depression   Psychosocial Evaluation and Intervention:   Psychosocial Re-Evaluation: Psychosocial Re-Evaluation    Row Name 01/23/20 1612 02/16/20 1602 03/15/20 1631         Psychosocial Re-Evaluation   Current issues with  None Identified  None Identified  None Identified     Interventions  Encouraged to attend Cardiac Rehabilitation for the exercise  Encouraged to attend Cardiac Rehabilitation for the exercise  Encouraged to attend Cardiac Rehabilitation for the exercise     Continue Psychosocial Services   No Follow up required  No Follow up required  No Follow up required        Psychosocial Discharge (Final Psychosocial Re-Evaluation): Psychosocial Re-Evaluation - 03/15/20 1631      Psychosocial Re-Evaluation   Current issues with  None Identified  Interventions  Encouraged to attend Cardiac Rehabilitation for the exercise    Continue Psychosocial Services   No Follow up required       Vocational Rehabilitation: Provide vocational rehab assistance to qualifying candidates.   Vocational Rehab Evaluation & Intervention: Vocational Rehab - 01/05/20 1535      Initial Vocational Rehab Evaluation & Intervention   Assessment shows need for Vocational Rehabilitation  No       Education: Education Goals: Education classes will be provided on a weekly basis, covering required topics. Participant will state understanding/return demonstration of topics  presented.  Learning Barriers/Preferences: Learning Barriers/Preferences - 01/05/20 1554      Learning Barriers/Preferences   Learning Barriers  Sight    Learning Preferences  Written Material;Verbal Instruction;Individual Instruction       Education Topics: Hypertension, Hypertension Reduction -Define heart disease and high blood pressure. Discus how high blood pressure affects the body and ways to reduce high blood pressure.   Exercise and Your Heart -Discuss why it is important to exercise, the FITT principles of exercise, normal and abnormal responses to exercise, and how to exercise safely.   Angina -Discuss definition of angina, causes of angina, treatment of angina, and how to decrease risk of having angina.   Cardiac Medications -Review what the following cardiac medications are used for, how they affect the body, and side effects that may occur when taking the medications.  Medications include Aspirin, Beta blockers, calcium channel blockers, ACE Inhibitors, angiotensin receptor blockers, diuretics, digoxin, and antihyperlipidemics.   Congestive Heart Failure -Discuss the definition of CHF, how to live with CHF, the signs and symptoms of CHF, and how keep track of weight and sodium intake.   Heart Disease and Intimacy -Discus the effect sexual activity has on the heart, how changes occur during intimacy as we age, and safety during sexual activity.   Smoking Cessation / COPD -Discuss different methods to quit smoking, the health benefits of quitting smoking, and the definition of COPD.   Nutrition I: Fats -Discuss the types of cholesterol, what cholesterol does to the heart, and how cholesterol levels can be controlled.   Nutrition II: Labels -Discuss the different components of food labels and how to read food label   Heart Parts/Heart Disease and PAD -Discuss the anatomy of the heart, the pathway of blood circulation through the heart, and these are  affected by heart disease.   Stress I: Signs and Symptoms -Discuss the causes of stress, how stress may lead to anxiety and depression, and ways to limit stress.   Stress II: Relaxation -Discuss different types of relaxation techniques to limit stress.   Warning Signs of Stroke / TIA -Discuss definition of a stroke, what the signs and symptoms are of a stroke, and how to identify when someone is having stroke.   Knowledge Questionnaire Score: Knowledge Questionnaire Score - 01/05/20 1553      Knowledge Questionnaire Score   Pre Score  20/24       Core Components/Risk Factors/Patient Goals at Admission: Personal Goals and Risk Factors at Admission - 01/05/20 1628      Core Components/Risk Factors/Patient Goals on Admission    Weight Management  Yes;Weight Maintenance;Obesity;Weight Loss    Intervention  Weight Management: Develop a combined nutrition and exercise program designed to reach desired caloric intake, while maintaining appropriate intake of nutrient and fiber, sodium and fats, and appropriate energy expenditure required for the weight goal.;Weight Management: Provide education and appropriate resources to help participant work on and  attain dietary goals.;Weight Management/Obesity: Establish reasonable short term and long term weight goals.    Hypertension  Yes    Intervention  Provide education on lifestyle modifcations including regular physical activity/exercise, weight management, moderate sodium restriction and increased consumption of fresh fruit, vegetables, and low fat dairy, alcohol moderation, and smoking cessation.;Monitor prescription use compliance.    Expected Outcomes  Short Term: Continued assessment and intervention until BP is < 140/22mm HG in hypertensive participants. < 130/19mm HG in hypertensive participants with diabetes, heart failure or chronic kidney disease.;Long Term: Maintenance of blood pressure at goal levels.    Lipids  Yes    Intervention   Provide education and support for participant on nutrition & aerobic/resistive exercise along with prescribed medications to achieve LDL 70mg , HDL >40mg .    Expected Outcomes  Short Term: Participant states understanding of desired cholesterol values and is compliant with medications prescribed. Participant is following exercise prescription and nutrition guidelines.;Long Term: Cholesterol controlled with medications as prescribed, with individualized exercise RX and with personalized nutrition plan. Value goals: LDL < , HDL > 40 mg.       Core Components/Risk Factors/Patient Goals Review:  Goals and Risk Factor Review    Row Name 01/23/20 1612 02/16/20 1602 03/15/20 1632         Core Components/Risk Factors/Patient Goals Review   Personal Goals Review  Weight Management/Obesity;Hypertension;Lipids  Weight Management/Obesity;Hypertension;Lipids  Weight Management/Obesity;Hypertension;Lipids     Review  [name] started exercise on 01/23/20 and tolerated his first day of exercise without difficulty  [name] has done well with exercise. Nick's vital signs have been stable  [name] has done well with exercise. Nick's vital signs have been stable. [name] completes exercise at cardiac rehab on 03/16/20     Expected Outcomes  [name] will continue tonpartcicpate in phase 2 cardiac rehab for exercise, nutrition and lifestyle modifications  [name] will continue tonpartcicpate in phase 2 cardiac rehab for exercise, nutrition and lifestyle modifications  [name] will continue to exercise upon completion of phase 2 cardiac rehab follow , nutrition and lifestyle modifications        Core Components/Risk Factors/Patient Goals at Discharge (Final Review):  Goals and Risk Factor Review - 03/15/20 1632      Core Components/Risk Factors/Patient Goals Review   Personal Goals Review  Weight Management/Obesity;Hypertension;Lipids    Review  [name] has done well with exercise. Nick's vital signs have been stable. [name] completes  exercise at cardiac rehab on 03/16/20    Expected Outcomes  [name] will continue to exercise upon completion of phase 2 cardiac rehab follow , nutrition and lifestyle modifications       ITP Comments: ITP Comments    Row Name 01/05/20 1526 01/19/20 1659 01/23/20 1609 02/16/20 1601 03/15/20 1631   ITP Comments  Dr Armanda Magic MD, Medical Director  30 Day ITP Review. Patient is to start exercise on 01/23/20  [name] started exercise on 01/23/20 and tolerated exercise without difficulty.  30 Day ITP Review. Patient is with good attendance and partcipation in phase 2 cardiac rehab.  30 Day ITP Review. [name]  is with good attendance and partcipation in phase 2 cardiac rehab. [name] completes cardiac rehab tomorrow.      Comments: See ITP comments.Gladstone Lighter, RN,BSN 03/15/2020 4:35 PM

## 2020-03-16 ENCOUNTER — Encounter (HOSPITAL_COMMUNITY)
Admission: RE | Admit: 2020-03-16 | Discharge: 2020-03-16 | Disposition: A | Discharge status: Home / Self Care | Payer: Medicare Other | Source: Ambulatory Visit | Attending: Cardiovascular Disease | Admitting: Cardiovascular Disease

## 2020-03-16 ENCOUNTER — Encounter (HOSPITAL_COMMUNITY): Payer: Medicare Other

## 2020-03-16 ENCOUNTER — Other Ambulatory Visit: Payer: Self-pay

## 2020-03-16 DIAGNOSIS — J45909 Unspecified asthma, uncomplicated: Secondary | ICD-10-CM | POA: Diagnosis not present

## 2020-03-16 DIAGNOSIS — I2119 ST elevation (STEMI) myocardial infarction involving other coronary artery of inferior wall: Secondary | ICD-10-CM

## 2020-03-16 DIAGNOSIS — Z7901 Long term (current) use of anticoagulants: Secondary | ICD-10-CM | POA: Diagnosis not present

## 2020-03-16 DIAGNOSIS — Z7982 Long term (current) use of aspirin: Secondary | ICD-10-CM | POA: Diagnosis not present

## 2020-03-16 DIAGNOSIS — Z79899 Other long term (current) drug therapy: Secondary | ICD-10-CM | POA: Diagnosis not present

## 2020-03-16 DIAGNOSIS — Z955 Presence of coronary angioplasty implant and graft: Secondary | ICD-10-CM

## 2020-03-16 NOTE — Progress Notes (Signed)
Discharge Progress Report  Patient Details  Name: Kevin Guzman MRN: [id number] Date of Birth: 31-Jul-1951 Referring Provider:     [place] from 01/05/2020 in [place]  Referring Provider Sherren Mocha MD       Number of Visits: 19  Reason for Discharge:  Patient reached a stable level of exercise. Patient independent in their exercise. Patient has met program and personal goals.  Smoking History:  Social History   Tobacco Use  Smoking Status Former Smoker  Smokeless Tobacco Former Systems developer  . Quit date: 10/13/1998    Diagnosis:  Acute ST elevation myocardial infarction (STEMI) of inferior wall (HCC)  S/P DES GRAFT X 2 12/06/19  ADL [facility]:   Initial Exercise Prescription:  Initial Exercise Prescription - 01/05/20 1600      Date of Initial Exercise RX and Referring Provider   Date 01/05/20    Referring Provider Sherren Mocha MD    Expected Discharge Date 03/16/20      Treadmill   MPH 1.6    Grade 1    Minutes 15    METs 2.4      NuStep   Level 2    SPM 85    Minutes 15    METs 2.2      Prescription Details   Frequency (times per week) 3x    Duration Progress to 30 minutes of continuous aerobic without signs/symptoms of physical distress      Intensity   THRR 40-80% of Max Heartrate 61-122    Ratings of Perceived Exertion 11-13    Perceived Dyspnea 0-4      Progression   Progression Continue progressive overload as per policy without signs/symptoms or physical distress.      Resistance Training   Training Prescription Yes    Weight 3lbs    Reps 10-15           Discharge Exercise Prescription (Final Exercise Prescription Changes):  Exercise Prescription Changes - 03/16/20 1430      Response to Exercise   Blood Pressure (Admit) 108/62    Blood Pressure (Exercise) 122/70    Blood Pressure (Exit) 120/64    Heart Rate (Admit) 69 bpm    Heart Rate (Exercise) 87 bpm    Heart Rate  (Exit) 69 bpm    Rating of Perceived Exertion (Exercise) 12    Perceived Dyspnea (Exercise) 0    Symptoms None    Comments Pt's last day of exercise    Duration Continue with 30 min of aerobic exercise without signs/symptoms of physical distress.    Intensity THRR unchanged      Progression   Progression Continue to progress workloads to maintain intensity without signs/symptoms of physical distress.    Average METs 3      Resistance Training   Training Prescription Yes    Weight 4lbs    Reps 10-15    Time 10 Minutes      Interval Training   Interval Training No      Treadmill   MPH 2.3    Grade 2    Minutes 15    METs 3.39      NuStep   Level 4    SPM 95    Minutes 15    METs 2.5      Home Exercise Plan   Plans to continue exercise at [employer] [employer] (comment)   Walking, Local [facility]   Frequency Add 3 additional days to program exercise sessions.  Initial Home Exercises Provided 02/20/20           Functional Capacity:  6 Minute Walk    Row Name 01/05/20 1539 03/14/20 1640       6 Minute Walk   Phase Initial Discharge    Distance 1261 feet 1630 feet    Distance % Change -- 29.26 %    Distance Feet Change -- 369 ft    Walk Time 6 minutes 6 minutes    # of Rest Breaks 0 0    MPH 2.4 3.09    METS 2.4 3.3    RPE 12 14    Perceived Dyspnea  0 0    VO2 Peak 8.3 11.7    Symptoms No No    Resting HR 82 bpm 65 bpm    Resting BP 104/60 122/68    Resting Oxygen Saturation  95 % --    Exercise Oxygen Saturation  during 6 min walk 95 % --    Max Ex. HR 93 bpm 121 bpm    Max Ex. BP 120/70 132/80    2 Minute Post BP 110/60 118/70           Psychological, QOL, Others - Outcomes: PHQ 2/9: Depression screen [facility] 2/9 03/16/2020 01/05/2020  Decreased Interest 0 0  Down, Depressed, Hopeless 0 0  PHQ - 2 Score 0 0    Quality of Life:  Quality of Life - 03/28/20 1634      Quality of Life   Select Quality of Life      Quality of Life Scores    Health/Function Pre 20.39 %    Health/Function Post 22.17 %    Health/Function % Change 8.73 %    Socioeconomic Pre 23.33 %    Socioeconomic Post 21.57 %    Socioeconomic % Change  -7.54 %    Psych/Spiritual Pre 23.57 %    Psych/Spiritual Post 21.71 %    Psych/Spiritual % Change -7.89 %    Family Pre 25.5 %    Family Post 18.3 %    Family % Change -28.24 %    GLOBAL Pre 22.44 %    GLOBAL Post 21.38 %    GLOBAL % Change -4.72 %           Personal Goals: Goals established at orientation with interventions provided to work toward goal.  Personal Goals and Risk Factors at Admission - 01/05/20 1628      Core Components/Risk Factors/Patient Goals on Admission    Weight Management Yes;Weight Maintenance;Obesity;Weight Loss    Intervention Weight Management: Develop a combined nutrition and exercise program designed to reach desired caloric intake, while maintaining appropriate intake of nutrient and fiber, sodium and fats, and appropriate energy expenditure required for the weight goal.;Weight Management: Provide education and appropriate resources to help participant work on and attain dietary goals.;Weight Management/Obesity: Establish reasonable short term and long term weight goals.    Hypertension Yes    Intervention Provide education on lifestyle modifcations including regular physical activity/exercise, weight management, moderate sodium restriction and increased consumption of fresh fruit, vegetables, and low fat dairy, alcohol moderation, and smoking cessation.;Monitor prescription use compliance.    Expected Outcomes Short Term: Continued assessment and intervention until BP is < 140/11m HG in hypertensive participants. < 130/858mHG in hypertensive participants with diabetes, heart failure or chronic kidney disease.;Long Term: Maintenance of blood pressure at goal levels.    Lipids Yes    Intervention Provide education and support for participant on nutrition &  aerobic/resistive  exercise along with prescribed medications to achieve LDL '70mg'$ , HDL >'40mg'$ .    Expected Outcomes Short Term: Participant states understanding of desired cholesterol values and is compliant with medications prescribed. Participant is following exercise prescription and nutrition guidelines.;Long Term: Cholesterol controlled with medications as prescribed, with individualized exercise RX and with personalized nutrition plan. Value goals: LDL < '70mg'$ , HDL > 40 mg.            Personal Goals Discharge:  Goals and Risk Factor Review    Row Name 01/23/20 1612 02/16/20 1602 03/15/20 1632         Core Components/Risk Factors/Patient Goals Review   Personal Goals Review Weight Management/Obesity;Hypertension;Lipids Weight Management/Obesity;Hypertension;Lipids Weight Management/Obesity;Hypertension;Lipids     Review Kevin Guzman started exercise on 01/23/20 and tolerated his first day of exercise without difficulty Kevin Guzman has done well with exercise. Nick's vital signs have been stable Kevin Guzman has done well with exercise. Nick's vital signs have been stable. Kevin Guzman completes exercise at cardiac rehab on 03/16/20     Expected Outcomes Kevin Guzman will continue tonpartcicpate in phase 2 cardiac rehab for exercise, nutrition and lifestyle modifications Kevin Guzman will continue tonpartcicpate in phase 2 cardiac rehab for exercise, nutrition and lifestyle modifications Kevin Guzman will continue to exercise upon completion of phase 2 cardiac rehab follow , nutrition and lifestyle modifications            Exercise Goals and Review:  Exercise Goals    Row Name 01/05/20 1550             Exercise Goals   Increase Physical Activity Yes       Intervention Provide advice, education, support and counseling about physical activity/exercise needs.;Develop an individualized exercise prescription for aerobic and resistive training based on initial evaluation findings, risk stratification, comorbidities and participant's personal goals.        Expected Outcomes Short Term: Attend rehab on a regular basis to increase amount of physical activity.;Long Term: Add in home exercise to make exercise part of routine and to increase amount of physical activity.;Long Term: Exercising regularly at least 3-5 days a week.       Increase Strength and Stamina Yes       Intervention Provide advice, education, support and counseling about physical activity/exercise needs.;Develop an individualized exercise prescription for aerobic and resistive training based on initial evaluation findings, risk stratification, comorbidities and participant's personal goals.       Expected Outcomes Short Term: Increase workloads from initial exercise prescription for resistance, speed, and METs.;Short Term: Perform resistance training exercises routinely during rehab and add in resistance training at home;Long Term: Improve cardiorespiratory fitness, muscular endurance and strength as measured by increased METs and functional capacity (6MWT)       Able to understand and use rate of perceived exertion (RPE) scale Yes       Intervention Provide education and explanation on how to use RPE scale       Expected Outcomes Short Term: Able to use RPE daily in rehab to express subjective intensity level;Long Term:  Able to use RPE to guide intensity level when exercising independently       Knowledge and understanding of Target Heart Rate Range (THRR) Yes       Intervention Provide education and explanation of THRR including how the numbers were predicted and where they are located for reference       Expected Outcomes Short Term: Able to state/look up THRR;Long Term: Able to use THRR to govern intensity when exercising independently;Short Term:  Able to use daily as guideline for intensity in rehab       Able to check pulse independently Yes       Intervention Provide education and demonstration on how to check pulse in carotid and radial arteries.;Review the importance of being able to  check your own pulse for safety during independent exercise       Expected Outcomes Short Term: Able to explain why pulse checking is important during independent exercise;Long Term: Able to check pulse independently and accurately       Understanding of Exercise Prescription Yes       Intervention Provide education, explanation, and written materials on patient's individual exercise prescription       Expected Outcomes Short Term: Able to explain program exercise prescription;Long Term: Able to explain home exercise prescription to exercise independently              Exercise Goals Re-Evaluation:  Exercise Goals Re-Evaluation    Row Name 01/24/20 1515 02/20/20 1500 03/15/20 1526 03/29/20 0714       Exercise Goal Re-Evaluation   Exercise Goals Review Increase Physical Activity;Able to understand and use rate of perceived exertion (RPE) scale Increase Physical Activity;Increase Strength and Stamina;Able to understand and use rate of perceived exertion (RPE) scale;Knowledge and understanding of Target Heart Rate Range (THRR);Able to check pulse independently;Understanding of Exercise Prescription Increase Physical Activity;Increase Strength and Stamina;Able to understand and use rate of perceived exertion (RPE) scale;Knowledge and understanding of Target Heart Rate Range (THRR);Able to check pulse independently;Understanding of Exercise Prescription Increase Physical Activity;Increase Strength and Stamina;Able to understand and use rate of perceived exertion (RPE) scale;Knowledge and understanding of Target Heart Rate Range (THRR);Able to check pulse independently;Understanding of Exercise Prescription    Comments Patient able to understand and use RPE scale appropriately. Reviewed home exercise prescription with patient. Discussed rationale to increase activity at home to agument phase 2 C/R and help develop long term exercise habits. Reviewed THR and and RPE scale and assisted patient with taking  pulse so he can do so independently. Pt not currently walking at home. Provided copy of prescription. Pt is responding very well to workload increases. Pt is able to exercise 30 minutes with minimal difficulty. Pt completed 19 sessions of Cardiac Rehab. Pt increased functional capacity by 29.26%, increasing his post 54mt distance by [id number] Pt also increased his confidence and knowledge with exercise. Pt states he feels that program has been very beneficial for his health.    Expected Outcomes Increase workloads as tolerated to help improve cardiorespiratory fitness. Pt will begin to walk at home 2-3 days/wk for 30-45 minutes and follow the guidelines contained in the exercise prescription. Pt will continue walk 2-3 days a week for 30-45 minutes. Pt will continue to exercise 4-5 days a week at local gym, for 45-60 minutes.           Nutrition & Weight - Outcomes:  Pre Biometrics - 01/05/20 1549      Pre Biometrics   Height 5' 3.3" (1.608 m)    Weight 91 kg    Waist Circumference 44 inches    Hip Circumference 42 inches    Waist to Hip Ratio 1.05 %    BMI (Calculated) 35.19    Triceps Skinfold 15 mm    % Body Fat 32.9 %    Grip Strength 40 kg    Flexibility 20 in    Single Leg Stand 30 seconds  Post Biometrics - 03/14/20 1641       Post  Biometrics   Height 5' 3.39" (1.61 m)    Weight 92.8 kg    Waist Circumference 44 inches    Hip Circumference 43 inches    Waist to Hip Ratio 1.02 %    BMI (Calculated) 35.8    Triceps Skinfold 16 mm    % Body Fat 33.5 %    Grip Strength 43 kg    Flexibility 18 in    Single Leg Stand 31.31 seconds           Nutrition:  Nutrition Therapy & Goals - 02/09/20 0642      Nutrition Therapy   Diet Heart Healthy      Personal Nutrition Goals   Nutrition Goal Pt to identify food quantities necessary to achieve weight loss of 6-24 lb at graduation from cardiac rehab.    Personal Goal #2 Pt to build a healthy plate including  vegetables, fruits, whole grains, and low-fat dairy products in a heart healthy meal plan.      Intervention Plan   Intervention Nutrition handout(s) given to patient.;Prescribe, educate and counsel regarding individualized specific dietary modifications aiming towards targeted core components such as weight, hypertension, lipid management, diabetes, heart failure and other comorbidities.    Expected Outcomes Short Term Goal: A plan has been developed with personal nutrition goals set during dietitian appointment.           Nutrition Discharge:  Nutrition Assessments - 03/29/20 0757      MEDFICTS Scores   Post Score 43           Education Questionnaire Score:  Knowledge Questionnaire Score - 03/28/20 1635      Knowledge Questionnaire Score   Pre Score 20/24    Post Score 19/24           Goals reviewed with patient; copy given to patient. Pt graduated from cardiac rehab program today with completion of 36 exercise sessions in Phase II. Pt maintained good attendance and progressed nicely during his participation in rehab as evidenced by increased MET level.   Medication list reconciled. Repeat  PHQ score- 0 .  Pt has made significant lifestyle changes and should be commended for his success. Pt feels he has achieved his goals during cardiac rehab.   Pt plans to continue exercise by going to [employer] and walking. Kevin Guzman increased his distance on his post exercise walk test by 369 feet. We are proud of Nick's progress. Barnet Pall, RN,BSN 03/29/2020 8:41 AM

## 2020-07-31 NOTE — Progress Notes (Signed)
Cardiology Office Note    Date:  08/01/2020   ID:  Kevin Guzman, DOB [birth year], MRN [id number]  PCP:  Verlon Au, MD  Cardiologist: Tonny Bollman, MD EPS: None  Chief Complaint  Patient presents with  . Follow-up    History of Present Illness:  Kevin Guzman is a 69 y.o. male with history of CAD status post inferior STEMI 11/2019 treated with DES x2 to the SVG to OM, LIMA to LAD and vein graft to the diagonal patent, EF 60 to 65% with grade 1 DD.  Also has hypertension, hyperlipidemia, PVCs.  Patient saw Tereso Newcomer 01/2020 and had drooping of his mouth.  No neurological deficits noted and he was asked to follow-up with his PCP.  Monitor showed normal sinus rhythm no atrial fibrillation no PVCs and rare supraventricular beats without sustained arrhythmias.  Patient says he's had 2-3 episodes of heart fluttering very fleeting occurred when sitting and laying in bed.  On low dose zebeta because of low BP. Has had a twinge in his chest on occasion. Denies chest pressure, does have asthma and some dyspnea uses inhalers. Occasional leg edema. Eats out too much. 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 No regular exercise. Has a gym membership but hasn't gone.   Past Medical History:  Diagnosis Date  . Acute ST elevation myocardial infarction (STEMI) of inferior wall (HCC) 12/06/2019  . Asthma   . CAD (coronary artery disease)    a. 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 EF 65% at cath 8/08  . Coronary artery disease involving coronary bypass graft of native heart with angina pectoris [facility]) 12/07/2019   12/06/2019: Inferior STEMI-thrombotic occlusion of SVG-OM treated with rheolytic (AngioJet) thrombectomy and 2 site DES PCI.  Marland Kitchen History of cardiac monitoring    Cardiac Monitor 02/2020: NSR, avg HR 65, no PVCs, no AF  . HLD (hyperlipidemia)   . HTN (hypertension)   . STEMI (ST elevation  myocardial infarction) (HCC) 12/06/2019  . Testosterone deficiency     Past Surgical History:  Procedure Laterality Date  . CORONARY ARTERY BYPASS GRAFT  2008   LIMA-LAD, SVG-diagonal, SVG-OM, SVG-ramus  . CORONARY/GRAFT ACUTE MI REVASCULARIZATION N/A 12/06/2019   Procedure: Coronary/Graft Acute MI Revascularization;  Surgeon: Tonny Bollman, MD;  Location: [facility] INVASIVE CV LAB;  Service: Cardiovascular;  Laterality: N/A;  . HERNIA REPAIR    . LEFT HEART CATH AND CORONARY ANGIOGRAPHY N/A 12/06/2019   Procedure: LEFT HEART CATH AND CORONARY ANGIOGRAPHY;  Surgeon: Tonny Bollman, MD;  Location: [facility] INVASIVE CV LAB;  Service: Cardiovascular;  Laterality: N/A;  . TEMPORARY PACEMAKER N/A 12/06/2019   Procedure: TEMPORARY PACEMAKER;  Surgeon: Tonny Bollman, MD;  Location: [facility] INVASIVE CV LAB;  Service: Cardiovascular;  Laterality: N/A;    Current Medications: Current Meds  Medication Sig  . albuterol (PROVENTIL HFA;VENTOLIN HFA) 108 (90 Base) MCG/ACT inhaler Inhale 2 puffs into the lungs every 4 (four) hours as needed for wheezing or shortness of breath.   Marland Kitchen albuterol (PROVENTIL) (2.5 MG/3ML) 0.083% nebulizer solution Take 2.5 mg by nebulization every 6 (six) hours as needed for wheezing or shortness of breath.  . allopurinol (ZYLOPRIM) 300 MG tablet Take 300 mg by mouth daily.  Marland Kitchen aspirin 81 MG EC tablet Take 81 mg by mouth daily.    Marland Kitchen atorvastatin (LIPITOR) 80 MG tablet TAKE 1 TABLET (80 MG TOTAL) BY MOUTH DAILY AT  6 PM.  . bisoprolol (ZEBETA) 5 MG tablet Take 0.5 tablets (2.5 mg total) by mouth daily.  Marland Kitchen ezetimibe (ZETIA) 10 MG tablet TAKE 1 TABLET BY MOUTH EVERY DAY  . fexofenadine (ALLEGRA) 180 MG tablet Take 180 mg by mouth daily as needed for allergies or rhinitis.  . fluticasone furoate-vilanterol (BREO ELLIPTA) 100-25 MCG/INH AEPB Inhale 1 puff into the lungs as needed (SOB/wheezing).   . Melatonin 10 MG CAPS Take 10 mg by mouth at bedtime as needed (for sleep).  . nitroGLYCERIN  (NITROSTAT) 0.4 MG SL tablet Place 1 tablet (0.4 mg total) under the tongue every 5 (five) minutes x 3 doses as needed for chest pain.  . prasugrel (EFFIENT) 10 MG TABS tablet TAKE 1 TABLET BY MOUTH EVERY DAY     Allergies:   Almond meal, Almond oil, and Ace inhibitors   Social History   Socioeconomic History  . Marital status: Married    Spouse name: Not on file  . Number of children: Not on file  . Years of education: 109  . Highest education level: Associate degree: academic program  Occupational History  . Not on file  Tobacco Use  . Smoking status: Former Games developer  . Smokeless tobacco: Former Neurosurgeon    Quit date: 10/13/1998  Vaping Use  . Vaping Use: Never used  Substance and Sexual Activity  . Alcohol use: No  . Drug use: No  . Sexual activity: Not on file  Other Topics Concern  . Not on file  Social History Narrative  . Not on file   Social Determinants of Health   Financial Resource Strain:   . Difficulty of Paying Living Expenses: Not on file  Food Insecurity:   . Worried About Programme researcher, broadcasting/film/video in the Last Year: Not on file  . Ran Out of Food in the Last Year: Not on file  Transportation Needs:   . Lack of Transportation (Medical): Not on file  . Lack of Transportation (Non-Medical): Not on file  Physical Activity:   . Days of Exercise per Week: Not on file  . Minutes of Exercise per Session: Not on file  Stress:   . Feeling of Stress : Not on file  Social Connections:   . Frequency of Communication with Friends and Family: Not on file  . Frequency of Social Gatherings with Friends and Family: Not on file  . Attends Religious Services: Not on file  . Active Member of Clubs or Organizations: Not on file  . Attends Banker Meetings: Not on file  . Marital Status: Not on file     Family History:  The patient's family history includes Cancer in his mother.   ROS:   Please see the history of present illness.    ROS All other systems reviewed and  are negative.   PHYSICAL EXAM:   VS:  BP (!) 90/58   Pulse 67   Ht 5\' 3"  (1.6 m)   Wt 201 lb 9.6 oz (91.4 kg)   SpO2 92%   BMI 35.71 kg/m   Physical Exam  GEN: Obese, in no acute distress  Neck: no JVD, carotid bruits, or masses Cardiac:RRR; no murmurs, rubs, or gallops  Respiratory:  clear to auscultation bilaterally, normal work of breathing GI: soft, nontender, nondistended, + BS Ext: without cyanosis, clubbing, or edema, Good distal pulses bilaterally MS: no deformity or atrophy  Skin: warm and dry, no rash Neuro:  Alert and Oriented x 3, Strength and sensation are  intact Psych: euthymic mood, full affect  Wt Readings from Last 3 Encounters:  08/01/20 201 lb 9.6 oz (91.4 kg)  03/14/20 204 lb 9.4 oz (92.8 kg)  02/09/20 202 lb (91.6 kg)      Studies/Labs Reviewed:   EKG:  EKG is not ordered today.   Recent Labs: 12/06/2019: ALT 24; TSH 2.756 12/21/2019: BUN 19; Creatinine, Ser 1.22; Hemoglobin 13.1; Platelets 340; Potassium 4.8; Sodium 142   Lipid Panel    Component Value Date/Time   CHOL 161 12/07/2019 0242   CHOL 159 09/13/2019 1418   TRIG 113 12/07/2019 0242   TRIG 199 (H) 08/18/2006 1507   HDL 51 12/07/2019 0242   HDL 63 09/13/2019 1418   CHOLHDL 3.2 12/07/2019 0242   VLDL 23 12/07/2019 0242   LDLCALC 87 12/07/2019 0242   LDLCALC 71 09/13/2019 1418   LDLDIRECT 132.0 06/08/2015 1732    Additional studies/ records that were reviewed today include:   Monitor 02/15/2020 Study Highlights  1. The basic rhythm is normal sinus with an average HR of 65 bpm 2. No atrial fibrillation or flutter 3. No high-grade heart block or pathologic pauses 4. There are no PVC's and rare supraventricular beats without sustained arrhythmias   Benign monitor      Echocardiogram 12/07/2019 EF 60-65, Gr 1 DD, normal RVSF, trivial MR   Cardiac catheterization 12/06/2019 LM mid 50 LAD ost 100 OM1 100 RCA prox 50 L-LAD ok S-D1 ok S-OM 80, mid 100 EF 55-65 PCI: 3.5 x 26  mm Resolute Onyx DES, 4 x 26 mm Resolute Onyx DES to S-OM   Myoview 02/24/2019 EF 63, Low Risk     ASSESSMENT:    1. Coronary artery disease involving coronary bypass graft of native heart without angina pectoris   2. Essential hypertension   3. Mixed hyperlipidemia   4. PVC's (premature ventricular contractions)   5. Class 2 severe obesity due to excess calories with serious comorbidity and body mass index (BMI) of 35.0 to 35.9 in adult [facility](HCC)      PLAN:  In order of problems listed above:   CAD status post NSTEMI in 2008 followed by CABG, inferior STEMI 11/2019 treated with DES x2 to the SVG to OM, LIMA to the LAD in SVG to the diagonal were patent.  EF 60 to 65% with grade 1 DD on echo. No angina continue effient and ASA.  Check labs today  Essential hypertension BP tends to run low  Hyperlipidemia LDL 87 11/2019 on Lipitor recheck in February  PVCs/PAC's -occasional palpitations. on low dose zebeta   Obesity exercise and weight loss recommended for his overall health.  [facility] and I will forward his information on to Dr. Tenny Crawoss.      - Medication Adjustments/Labs and Tests Ordered: Current medicines are reviewed at length with the patient today.  Concerns regarding medicines  are outlined above.  Medication changes, Labs and Tests ordered today are listed in the Patient Instructions below.  Patient Instructions  Medication Instructions:  Your physician recommends that you continue on your current medications as directed. Please refer to the Current Medication list given to you today.  *If you need a refill on your cardiac medications before your next appointment, please call your pharmacy*   Lab Work: TODAY: BMET, CBC - In February you will need a LFP  If you have labs (blood work) drawn today and your tests are completely normal, you will receive your results only by: .Marland Kitchen  MyChart Message (if you have MyChart) OR . A paper copy in  the mail If you have any lab test that is abnormal or we need to change your treatment, we will call you to review the results.   Testing/Procedures: None   Follow-Up: At [facility], you and your health needs are our priority.  As part of our continuing mission to provide you with exceptional heart care, we have created designated Provider Care Teams.  These Care Teams include your primary Cardiologist (physician) and Advanced Practice Providers (APPs -  Physician Assistants and Nurse Practitioners) who all work together to provide you with the care you need, when you need it.  We recommend signing up for the patient portal called "MyChart".  Sign up information is provided on this After Visit Summary.  MyChart is used to connect with patients for Virtual Visits (Telemedicine).  Patients are able to view lab/test results, encounter notes, upcoming appointments, etc.  Non-urgent messages can be sent to your provider as well.   To learn more about what you can do with MyChart, go to [email or web address].    Your next appointment:   1 year(s)  The format for your next appointment:   In Person  Provider:   You may see Tonny Bollman, MD or one of the following Advanced Practice Providers on your designated Care Team:    Tereso Newcomer, PA-C  Chelsea Aus, [place]    Other Instructions Your provider recommends that you maintain 150 minutes per week of moderate aerobic activity.      Signed, Jacolyn Reedy, PA-C  08/01/2020 1:06 PM    [facility] [address] [place], [place], [place]  [address] Phone: [phone]; Fax: [phone]

## 2020-08-01 ENCOUNTER — Ambulatory Visit (INDEPENDENT_AMBULATORY_CARE_PROVIDER_SITE_OTHER): Payer: Medicare Other | Admitting: Physician Assistant

## 2020-08-01 ENCOUNTER — Encounter: Payer: Self-pay | Admitting: Physician Assistant

## 2020-08-01 ENCOUNTER — Other Ambulatory Visit: Payer: Self-pay

## 2020-08-01 VITALS — BP 90/58 | HR 67 | Ht 63.0 in | Wt 201.6 lb

## 2020-08-01 DIAGNOSIS — E782 Mixed hyperlipidemia: Secondary | ICD-10-CM

## 2020-08-01 DIAGNOSIS — I10 Essential (primary) hypertension: Secondary | ICD-10-CM | POA: Diagnosis not present

## 2020-08-01 DIAGNOSIS — Z6835 Body mass index (BMI) 35.0-35.9, adult: Secondary | ICD-10-CM

## 2020-08-01 DIAGNOSIS — I493 Ventricular premature depolarization: Secondary | ICD-10-CM

## 2020-08-01 DIAGNOSIS — I25810 Atherosclerosis of coronary artery bypass graft(s) without angina pectoris: Secondary | ICD-10-CM | POA: Diagnosis not present

## 2020-08-01 NOTE — Patient Instructions (Signed)
Medication Instructions:  Your physician recommends that you continue on your current medications as directed. Please refer to the Current Medication list given to you today.  *If you need a refill on your cardiac medications before your next appointment, please call your pharmacy*   Lab Work: TODAY: BMET, CBC - In February you will need a LFP  If you have labs (blood work) drawn today and your tests are completely normal, you will receive your results only by: Marland Kitchen MyChart Message (if you have MyChart) OR . A paper copy in the mail If you have any lab test that is abnormal or we need to change your treatment, we will call you to review the results.   Testing/Procedures: None   Follow-Up: At [facility], you and your health needs are our priority.  As part of our continuing mission to provide you with exceptional heart care, we have created designated Provider Care Teams.  These Care Teams include your primary Cardiologist (physician) and Advanced Practice Providers (APPs -  Physician Assistants and Nurse Practitioners) who all work together to provide you with the care you need, when you need it.  We recommend signing up for the patient portal called "MyChart".  Sign up information is provided on this After Visit Summary.  MyChart is used to connect with patients for Virtual Visits (Telemedicine).  Patients are able to view lab/test results, encounter notes, upcoming appointments, etc.  Non-urgent messages can be sent to your provider as well.   To learn more about what you can do with MyChart, go to [email or web address].    Your next appointment:   1 year(s)  The format for your next appointment:   In Person  Provider:   You may see Tonny Bollman, MD or one of the following Advanced Practice Providers on your designated Care Team:    Tereso Newcomer, PA-C  Chelsea Aus, [place]    Other Instructions Your provider recommends that you maintain 150 minutes per week of moderate  aerobic activity.

## 2020-08-02 LAB — CBC
Hematocrit: 41.5 % (ref 37.5–51.0)
Hemoglobin: 13.6 g/dL (ref 13.0–17.7)
MCH: 28.5 pg (ref 26.6–33.0)
MCHC: 32.8 g/dL (ref 31.5–35.7)
MCV: 87 fL (ref 79–97)
Platelets: 282 10*3/uL (ref 150–450)
RBC: 4.78 x10E6/uL (ref 4.14–5.80)
RDW: 13.9 % (ref 11.6–15.4)
WBC: 6.9 10*3/uL (ref 3.4–10.8)

## 2020-08-02 LAB — BASIC METABOLIC PANEL
BUN/Creatinine Ratio: 15 (ref 10–24)
BUN: 16 mg/dL (ref 8–27)
CO2: 23 mmol/L (ref 20–29)
Calcium: 8.9 mg/dL (ref 8.6–10.2)
Chloride: 101 mmol/L (ref 96–106)
Creatinine, Ser: 1.06 mg/dL (ref 0.76–1.27)
GFR calc Af Amer: 84 mL/min/{1.73_m2} (ref >59)
GFR calc non Af Amer: 72 mL/min/{1.73_m2} (ref >59)
Glucose: 87 mg/dL (ref 65–99)
Potassium: 4.4 mmol/L (ref 3.5–5.2)
Sodium: 140 mmol/L (ref 134–144)

## 2020-08-02 NOTE — Progress Notes (Signed)
Pt has been made aware of normal result and verbalized understanding.  jw

## 2020-11-14 ENCOUNTER — Other Ambulatory Visit: Payer: Self-pay | Admitting: Cardiovascular Disease

## 2020-11-20 ENCOUNTER — Other Ambulatory Visit: Payer: Medicare Other

## 2021-04-22 ENCOUNTER — Other Ambulatory Visit: Payer: Self-pay | Admitting: Cardiology

## 2021-05-21 ENCOUNTER — Other Ambulatory Visit: Payer: Self-pay

## 2021-05-21 ENCOUNTER — Inpatient Hospital Stay (HOSPITAL_COMMUNITY)
Admission: EM | Admit: 2021-05-21 | Discharge: 2021-05-24 | DRG: 175 | Disposition: A | Discharge status: Home / Self Care | Payer: Medicare Other | Attending: Internal Medicine | Admitting: Internal Medicine

## 2021-05-21 ENCOUNTER — Emergency Department (HOSPITAL_COMMUNITY): Payer: Medicare Other

## 2021-05-21 DIAGNOSIS — I252 Old myocardial infarction: Secondary | ICD-10-CM

## 2021-05-21 DIAGNOSIS — Z955 Presence of coronary angioplasty implant and graft: Secondary | ICD-10-CM

## 2021-05-21 DIAGNOSIS — Z20822 Contact with and (suspected) exposure to covid-19: Secondary | ICD-10-CM | POA: Diagnosis present

## 2021-05-21 DIAGNOSIS — I959 Hypotension, unspecified: Secondary | ICD-10-CM | POA: Diagnosis present

## 2021-05-21 DIAGNOSIS — J4521 Mild intermittent asthma with (acute) exacerbation: Secondary | ICD-10-CM | POA: Diagnosis present

## 2021-05-21 DIAGNOSIS — E669 Obesity, unspecified: Secondary | ICD-10-CM | POA: Diagnosis present

## 2021-05-21 DIAGNOSIS — I2510 Atherosclerotic heart disease of native coronary artery without angina pectoris: Secondary | ICD-10-CM | POA: Diagnosis present

## 2021-05-21 DIAGNOSIS — I25709 Atherosclerosis of coronary artery bypass graft(s), unspecified, with unspecified angina pectoris: Secondary | ICD-10-CM | POA: Diagnosis present

## 2021-05-21 DIAGNOSIS — J45901 Unspecified asthma with (acute) exacerbation: Secondary | ICD-10-CM

## 2021-05-21 DIAGNOSIS — I4510 Unspecified right bundle-branch block: Secondary | ICD-10-CM | POA: Diagnosis present

## 2021-05-21 DIAGNOSIS — Z7951 Long term (current) use of inhaled steroids: Secondary | ICD-10-CM

## 2021-05-21 DIAGNOSIS — I2699 Other pulmonary embolism without acute cor pulmonale: Secondary | ICD-10-CM | POA: Diagnosis present

## 2021-05-21 DIAGNOSIS — Z888 Allergy status to other drugs, medicaments and biological substances status: Secondary | ICD-10-CM

## 2021-05-21 DIAGNOSIS — R002 Palpitations: Secondary | ICD-10-CM | POA: Diagnosis present

## 2021-05-21 DIAGNOSIS — J9601 Acute respiratory failure with hypoxia: Secondary | ICD-10-CM | POA: Diagnosis present

## 2021-05-21 DIAGNOSIS — Z79899 Other long term (current) drug therapy: Secondary | ICD-10-CM

## 2021-05-21 DIAGNOSIS — Z951 Presence of aortocoronary bypass graft: Secondary | ICD-10-CM

## 2021-05-21 DIAGNOSIS — Z87891 Personal history of nicotine dependence: Secondary | ICD-10-CM

## 2021-05-21 DIAGNOSIS — I10 Essential (primary) hypertension: Secondary | ICD-10-CM | POA: Diagnosis present

## 2021-05-21 DIAGNOSIS — Z9109 Other allergy status, other than to drugs and biological substances: Secondary | ICD-10-CM

## 2021-05-21 DIAGNOSIS — Z7982 Long term (current) use of aspirin: Secondary | ICD-10-CM

## 2021-05-21 DIAGNOSIS — I493 Ventricular premature depolarization: Secondary | ICD-10-CM | POA: Diagnosis present

## 2021-05-21 DIAGNOSIS — J9811 Atelectasis: Secondary | ICD-10-CM | POA: Diagnosis present

## 2021-05-21 DIAGNOSIS — Z6835 Body mass index (BMI) 35.0-35.9, adult: Secondary | ICD-10-CM

## 2021-05-21 DIAGNOSIS — E785 Hyperlipidemia, unspecified: Secondary | ICD-10-CM | POA: Diagnosis present

## 2021-05-21 DIAGNOSIS — R61 Generalized hyperhidrosis: Secondary | ICD-10-CM | POA: Diagnosis present

## 2021-05-21 LAB — COMPREHENSIVE METABOLIC PANEL
ALT: 21 U/L (ref 0–44)
AST: 24 U/L (ref 15–41)
Albumin: 4 g/dL (ref 3.5–5.0)
Alkaline Phosphatase: 114 U/L (ref 38–126)
Anion gap: 11 (ref 5–15)
BUN: 14 mg/dL (ref 8–23)
CO2: 26 mmol/L (ref 22–32)
Calcium: 9.1 mg/dL (ref 8.9–10.3)
Chloride: 103 mmol/L (ref 98–111)
Creatinine, Ser: 1.23 mg/dL (ref 0.61–1.24)
GFR, Estimated: >60 mL/min (ref >60)
Glucose, Bld: 112 mg/dL — ABNORMAL HIGH (ref 70–99)
Potassium: 4 mmol/L (ref 3.5–5.1)
Sodium: 140 mmol/L (ref 135–145)
Total Bilirubin: 0.9 mg/dL (ref 0.3–1.2)
Total Protein: 6.9 g/dL (ref 6.5–8.1)

## 2021-05-21 LAB — CBC WITH DIFFERENTIAL/PLATELET
Abs Immature Granulocytes: 0.03 10*3/uL (ref 0.00–0.07)
Basophils Absolute: 0 10*3/uL (ref 0.0–0.1)
Basophils Relative: 0 %
Eosinophils Absolute: 0.3 10*3/uL (ref 0.0–0.5)
Eosinophils Relative: 3 %
HCT: 43.3 % (ref 39.0–52.0)
Hemoglobin: 14.1 g/dL (ref 13.0–17.0)
Immature Granulocytes: 0 %
Lymphocytes Relative: 6 %
Lymphs Abs: 0.5 10*3/uL — ABNORMAL LOW (ref 0.7–4.0)
MCH: 29.4 pg (ref 26.0–34.0)
MCHC: 32.6 g/dL (ref 30.0–36.0)
MCV: 90.4 fL (ref 80.0–100.0)
Monocytes Absolute: 0.5 10*3/uL (ref 0.1–1.0)
Monocytes Relative: 5 %
Neutro Abs: 7.4 10*3/uL (ref 1.7–7.7)
Neutrophils Relative %: 86 %
Platelets: 245 10*3/uL (ref 150–400)
RBC: 4.79 MIL/uL (ref 4.22–5.81)
RDW: 13 % (ref 11.5–15.5)
WBC: 8.7 10*3/uL (ref 4.0–10.5)
nRBC: 0 % (ref 0.0–0.2)

## 2021-05-21 LAB — TROPONIN I (HIGH SENSITIVITY)
Troponin I (High Sensitivity): 8 ng/L (ref <18)
Troponin I (High Sensitivity): 9 ng/L (ref <18)

## 2021-05-21 LAB — BRAIN NATRIURETIC PEPTIDE: B Natriuretic Peptide: 91.5 pg/mL (ref 0.0–100.0)

## 2021-05-21 MED ORDER — SODIUM CHLORIDE 0.9 % IV BOLUS (SEPSIS)
1000.0000 mL | Freq: Once | INTRAVENOUS | Status: AC
  Administered 2021-05-21: 1000 mL via INTRAVENOUS

## 2021-05-21 MED ORDER — IPRATROPIUM BROMIDE 0.02 % IN SOLN
0.5000 mg | Freq: Once | RESPIRATORY_TRACT | Status: AC
  Administered 2021-05-21: 0.5 mg via RESPIRATORY_TRACT
  Filled 2021-05-21: qty 2.5

## 2021-05-21 MED ORDER — SODIUM CHLORIDE 0.9 % IV SOLN
1000.0000 mL | INTRAVENOUS | Status: DC
Start: 2021-05-21 — End: 2021-05-23
  Administered 2021-05-21 – 2021-05-23 (×5): 1000 mL via INTRAVENOUS

## 2021-05-21 MED ORDER — ALBUTEROL (5 MG/ML) CONTINUOUS INHALATION SOLN
10.0000 mg/h | INHALATION_SOLUTION | Freq: Once | RESPIRATORY_TRACT | Status: AC
  Administered 2021-05-21: 10 mg/h via RESPIRATORY_TRACT
  Filled 2021-05-21: qty 20

## 2021-05-21 MED ORDER — METHYLPREDNISOLONE SODIUM SUCC 125 MG IJ SOLR
125.0000 mg | Freq: Once | INTRAMUSCULAR | Status: AC
  Administered 2021-05-21: 125 mg via INTRAVENOUS
  Filled 2021-05-21: qty 2

## 2021-05-21 NOTE — ED Provider Notes (Signed)
Blood pressure (!) 115/59, pulse 86, temperature 99.3 F (37.4 C), temperature source Oral, resp. rate 20, SpO2 90 %.  In short, Kevin Guzman is a 71 y.o. male with a chief complaint of Shortness of Breath .  Refer to the original H&P for additional details.  Reviewed EKG with Dr. Allyson Sabal. Agrees no STEMI activation. Patient clinically looks well and feeling much better now on O2.       Maia Plan, MD 05/21/21 [phone]

## 2021-05-21 NOTE — ED Provider Notes (Signed)
[facility] EMERGENCY DEPARTMENT Provider Note   CSN: [id number] Arrival date & time: 05/21/21  1907     History Chief Complaint  Patient presents with   Shortness of Breath    Kevin Guzman is a 70 y.o. male.   Shortness of Breath Associated symptoms: headaches     Pt started feeling short of breath over the last day and a half.  He acutely became worse this afternoon and had to call EMS.  He was feeling diaphoretic.  Pt had been coughing.  NO leg pain.  NO fevers.  He became ligtheaded.  No chest pain.  In the ED he was given oxygen and now is feeling better.  Past Medical History:  Diagnosis Date   Acute ST elevation myocardial infarction (STEMI) of inferior wall (HCC) 12/06/2019   Asthma    CAD (coronary artery disease)    a. 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 EF 65% at cath 8/08   Coronary artery disease involving coronary bypass graft of native heart with angina pectoris [facility]) 12/07/2019   12/06/2019: Inferior STEMI-thrombotic occlusion of SVG-OM treated with rheolytic (AngioJet) thrombectomy and 2 site DES PCI.   History of cardiac monitoring    Cardiac Monitor 02/2020: NSR, avg HR 65, no PVCs, no AF   HLD (hyperlipidemia)    HTN (hypertension)    STEMI (ST elevation myocardial infarction) (HCC) 12/06/2019   Testosterone deficiency     Patient Active Problem List   Diagnosis Date Noted   Coronary artery disease involving coronary bypass graft of native heart with angina pectoris (HCC) 12/07/2019   Acute ST elevation myocardial infarction (STEMI) of inferior wall (HCC) 12/06/2019   Cellulitis of right lower extremity    Acute gout    Asthma 07/06/2016   Cellulitis 07/05/2016   Asthma exacerbation 01/13/2016   Acute respiratory failure with hypoxia (HCC) 01/13/2016   Hypokalemia 01/13/2016   Viral URI 01/13/2016   Allergic rhinitis 01/14/2013   Rash and nonspecific skin eruption 03/01/2012   Scabies 01/20/2012    Eczema 01/20/2012   Coronary artery disease involving native coronary artery of native heart with unstable angina pectoris (HCC) 08/03/2009   MASS, CHEST WALL 07/27/2009   Hyperlipemia 07/24/2008   Essential hypertension 07/24/2008   URI 07/24/2008   Asthma, intermittent with acute exacerbation 07/24/2008    Past Surgical History:  Procedure Laterality Date   CORONARY ARTERY BYPASS GRAFT  2008   LIMA-LAD, SVG-diagonal, SVG-OM, SVG-ramus   CORONARY/GRAFT ACUTE MI REVASCULARIZATION N/A 12/06/2019   Procedure: Coronary/Graft Acute MI Revascularization;  Surgeon: Tonny Bollman, MD;  Location: [facility] INVASIVE CV LAB;  Service: Cardiovascular;  Laterality: N/A;   HERNIA REPAIR     LEFT HEART CATH AND CORONARY ANGIOGRAPHY N/A 12/06/2019   Procedure: LEFT HEART CATH AND CORONARY ANGIOGRAPHY;  Surgeon: Tonny Bollman, MD;  Location: [facility] INVASIVE CV LAB;  Service: Cardiovascular;  Laterality: N/A;   TEMPORARY PACEMAKER N/A 12/06/2019   Procedure: TEMPORARY PACEMAKER;  Surgeon: Tonny Bollman, MD;  Location: [facility] INVASIVE CV LAB;  Service: Cardiovascular;  Laterality: N/A;       Family History  Problem Relation Age of Onset   Cancer Mother    Heart attack Neg Hx    Stroke Neg Hx     Social History   Tobacco Use   Smoking status: Former   Smokeless tobacco: Former    Quit date: 10/13/1998  Vaping Use   Vaping Use: Never used  Substance  Use Topics   Alcohol use: No   Drug use: No    Home Medications Prior to Admission medications   Medication Sig Start Date End Date Taking? Authorizing Provider  albuterol (PROVENTIL HFA;VENTOLIN HFA) 108 (90 Base) MCG/ACT inhaler Inhale 2 puffs into the lungs every 4 (four) hours as needed for wheezing or shortness of breath.     [provider]  albuterol (PROVENTIL) (2.5 MG/3ML) 0.083% nebulizer solution Take 2.5 mg by nebulization every 6 (six) hours as needed for wheezing or shortness of breath.    [provider]  allopurinol  (ZYLOPRIM) 300 MG tablet Take 300 mg by mouth daily.    [provider]  aspirin 81 MG EC tablet Take 81 mg by mouth daily.      [provider]  atorvastatin (LIPITOR) 80 MG tablet TAKE 1 TABLET (80 MG TOTAL) BY MOUTH DAILY AT 6 PM. 10/28/16   Tonny Bollman, MD  bisoprolol (ZEBETA) 5 MG tablet Take 0.5 tablets (2.5 mg total) by mouth daily. 12/23/19   Tereso Newcomer T, PA-C  ezetimibe (ZETIA) 10 MG tablet TAKE 1 TABLET BY MOUTH EVERY DAY 11/14/20   Tonny Bollman, MD  fexofenadine (ALLEGRA) 180 MG tablet Take 180 mg by mouth daily as needed for allergies or rhinitis.    [provider]  fluticasone furoate-vilanterol (BREO ELLIPTA) 100-25 MCG/INH AEPB Inhale 1 puff into the lungs as needed (SOB/wheezing).     [provider]  Melatonin 10 MG CAPS Take 10 mg by mouth at bedtime as needed (for sleep).    [provider]  nitroGLYCERIN (NITROSTAT) 0.4 MG SL tablet Place 1 tablet (0.4 mg total) under the tongue every 5 (five) minutes x 3 doses as needed for chest pain. 12/08/19   Arty Baumgartner, NP  prasugrel (EFFIENT) 10 MG TABS tablet TAKE ONE TABLET BY MOUTH DAILY 04/22/21   Tonny Bollman, MD    Allergies    Almond meal, Almond oil, and Ace inhibitors  Review of Systems   Review of Systems  Respiratory:  Positive for shortness of breath.   Neurological:  Positive for headaches.  All other systems reviewed and are negative.  Physical Exam Updated Vital Signs BP (!) 115/59 (BP Location: Right Arm)   Pulse 86   Temp 99.3 F (37.4 C) (Oral)   Resp 20   SpO2 90%   Physical Exam Vitals and nursing note reviewed.  Constitutional:      General: He is not in acute distress.    Appearance: He is well-developed.  HENT:     Head: Normocephalic and atraumatic.     Right Ear: External ear normal.     Left Ear: External ear normal.  Eyes:     General: No scleral icterus.       Right eye: No discharge.        Left eye: No discharge.      Conjunctiva/sclera: Conjunctivae normal.  Neck:     Trachea: No tracheal deviation.  Cardiovascular:     Rate and Rhythm: Normal rate and regular rhythm.  Pulmonary:     Effort: Pulmonary effort is normal. No respiratory distress.     Breath sounds: No stridor. Rhonchi present. No wheezing or rales.  Abdominal:     General: Bowel sounds are normal. There is no distension.     Palpations: Abdomen is soft.     Tenderness: There is no abdominal tenderness. There is no guarding or rebound.  Musculoskeletal:  General: No tenderness or deformity.     Cervical back: Neck supple.  Skin:    General: Skin is warm and dry.     Findings: No rash.  Neurological:     General: No focal deficit present.     Mental Status: He is alert.     Cranial Nerves: No cranial nerve deficit (no facial droop, extraocular movements intact, no slurred speech).     Sensory: No sensory deficit.     Motor: No abnormal muscle tone or seizure activity.     Coordination: Coordination normal.  Psychiatric:        Mood and Affect: Mood normal.    ED Results / Procedures / Treatments   Labs (all labs ordered are listed, but only abnormal results are displayed) Labs Reviewed  CBC WITH DIFFERENTIAL/PLATELET - Abnormal; Notable for the following components:      Result Value   Lymphs Abs 0.5 (*)    All other components within normal limits  COMPREHENSIVE METABOLIC PANEL - Abnormal; Notable for the following components:   Glucose, Bld 112 (*)    All other components within normal limits  HEPARIN LEVEL (UNFRACTIONATED) - Abnormal; Notable for the following components:   Heparin Unfractionated 0.94 (*)    All other components within normal limits  RESP PANEL BY RT-PCR (FLU A&B, COVID) ARPGX2  BRAIN NATRIURETIC PEPTIDE  D-DIMER, QUANTITATIVE  HEPARIN LEVEL (UNFRACTIONATED)  TSH  HEPARIN LEVEL (UNFRACTIONATED)  CBC  BASIC METABOLIC PANEL  TROPONIN I (HIGH SENSITIVITY)  TROPONIN I (HIGH SENSITIVITY)     EKG EKG Interpretation  Date/Time:  Tuesday May 21 2021 19:26:59 EDT Ventricular Rate:  89 PR Interval:  146 QRS Duration: 136 QT Interval:  390 QTC Calculation: 474 R Axis:   -71 Text Interpretation: Normal sinus rhythm Left axis deviation Right bundle branch block Abnormal ECG st depression v1-v3 new since prior tracing Confirmed by Linwood DibblesKnapp, Alease Fait [phone]([id number]) on 05/21/2021 8:22:45 PM  Radiology No results found.  Procedures Procedures  CRITICAL CARE Performed by: Linwood DibblesJon Lulia Schriner Total critical care time: 30 minutes Critical care time was exclusive of separately billable procedures and treating other patients. Critical care was necessary to treat or prevent imminent or life-threatening deterioration. Critical care was time spent personally by me on the following activities: development of treatment plan with patient and/or surrogate as well as nursing, discussions with consultants, evaluation of patient's response to treatment, examination of patient, obtaining history from patient or surrogate, ordering and performing treatments and interventions, ordering and review of laboratory studies, ordering and review of radiographic studies, pulse oximetry and re-evaluation of patient's condition.  Medications Ordered in ED Medications - No data to display  ED Course  I have reviewed the triage vital signs and the nursing notes.  Pertinent labs & imaging results that were available during my care of the patient were reviewed by me and considered in my medical decision making (see chart for details).  Clinical Course as of 05/22/21 2016  Tue May 21, 2021  2118 Chest x-ray without acute findings [JK]  2119 Initial troponin normal.  BNP normal.  CBC metabolic panel unremarkable. [JK]  2120 Patient does have a new oxygen requirement.  Chest x-ray without acute findings.  We will proceed with CT angiogram to rule out PE [JK]  2223 Patient still has some slight wheeze noted on exam.  We will give a  breathing treatment and steroids [JK]  2359 Notified that pt's IV extravasated.  Arm is swollen [JK]    Clinical  Course User Index [JK] Linwood Dibbles, MD   MDM Rules/Calculators/A&P                           Pt presented with complaints of shortness of breath.  EKG changes noted but initial trop normal.  SOme wheezing on exam, but oxygen requirement more than expected.  Ct angio ordered to evaluate for pe.  IV infilrated on first attempt.  Pt noted to have swelling but compartment soft.  Plan on second attempt for CT angio.  Anticipate need for admission.  Care turned over to Dr Blinda Leatherwood. Final Clinical Impression(s) / ED Diagnoses Dyspnea, Asthma exacerbation, hypoxia    Linwood Dibbles, MD 05/22/21 2020

## 2021-05-21 NOTE — ED Triage Notes (Signed)
Pt arrives via [facility] for c/o malaise for three days, SOB without relief from nebulizer.   Initial BP with 70/palp. 88% on RA, improved to 92% on 4 lpm via [place].   EMS last VS - 114/78, RR 18, 92% on 4 lpm via [place], CBG 129.   #24 R hand by EMS.

## 2021-05-21 NOTE — ED Provider Notes (Signed)
Emergency Medicine Provider Triage Evaluation Note  Kevin Guzman , a 70 y.o. male  was evaluated in triage.  Pt complains of shortness of breath for the past 3 days, prior history of asthma, also endorsing some mild days.  Productive cough with some colored sputum.  Does not wear oxygen at home, found to be 88% on room air, placed on 4 L nasal cannula bumping him up to 92%.  Also endorsing some chest discomfort, prior history of CAD with 2 Mis.  Review of Systems  Positive: Shortness of breath, cough, chest pain Negative: Fever, abdominal pain  Physical Exam  SpO2 92%  Gen:   Awake, no distress   Resp:  Normal effort  MSK:   Moves extremities without difficulty  Other:  Diminished throughout all lung fields.NO leg swelling   Medical Decision Making  Medically screening exam initiated at 7:12 PM.  Appropriate orders placed.  Allin Frix was informed that the remainder of the evaluation will be completed by another provider, this initial triage assessment does not replace that evaluation, and the importance of remaining in the ED until their evaluation is complete.  Placed on 4 L nasal cannula to bump up oxygen to 82% from 88%.  Prior history of asthma along with CAD.  Will need room ASAP.   Claude Manges, PA-C 05/21/21 1920    Margarita Grizzle, MD 05/28/21 1901

## 2021-05-21 NOTE — ED Triage Notes (Signed)
Pt c/o SHOB x3days, hx of asthma, MI, CAD.  Endorses productive cough, chest discomfort. Pt denies home O2 usage, 88% on RA, 4L [place] up to 92%.

## 2021-05-22 ENCOUNTER — Observation Stay (HOSPITAL_BASED_OUTPATIENT_CLINIC_OR_DEPARTMENT_OTHER): Payer: Medicare Other

## 2021-05-22 ENCOUNTER — Other Ambulatory Visit: Payer: Self-pay

## 2021-05-22 ENCOUNTER — Emergency Department (HOSPITAL_COMMUNITY): Payer: Medicare Other

## 2021-05-22 DIAGNOSIS — I2699 Other pulmonary embolism without acute cor pulmonale: Secondary | ICD-10-CM | POA: Diagnosis present

## 2021-05-22 DIAGNOSIS — I25709 Atherosclerosis of coronary artery bypass graft(s), unspecified, with unspecified angina pectoris: Secondary | ICD-10-CM

## 2021-05-22 DIAGNOSIS — I2602 Saddle embolus of pulmonary artery with acute cor pulmonale: Secondary | ICD-10-CM | POA: Diagnosis not present

## 2021-05-22 DIAGNOSIS — J4521 Mild intermittent asthma with (acute) exacerbation: Secondary | ICD-10-CM | POA: Diagnosis not present

## 2021-05-22 DIAGNOSIS — J9601 Acute respiratory failure with hypoxia: Secondary | ICD-10-CM | POA: Diagnosis not present

## 2021-05-22 DIAGNOSIS — E785 Hyperlipidemia, unspecified: Secondary | ICD-10-CM

## 2021-05-22 DIAGNOSIS — E669 Obesity, unspecified: Secondary | ICD-10-CM

## 2021-05-22 DIAGNOSIS — R002 Palpitations: Secondary | ICD-10-CM

## 2021-05-22 DIAGNOSIS — I10 Essential (primary) hypertension: Secondary | ICD-10-CM

## 2021-05-22 LAB — RESP PANEL BY RT-PCR (FLU A&B, COVID) ARPGX2
Influenza A by PCR: NEGATIVE
Influenza B by PCR: NEGATIVE
SARS Coronavirus 2 by RT PCR: NEGATIVE

## 2021-05-22 LAB — D-DIMER, QUANTITATIVE: D-Dimer, Quant: 0.38 ug/mL-FEU (ref 0.00–0.50)

## 2021-05-22 LAB — ECHOCARDIOGRAM COMPLETE
Area-P 1/2: 3.99 cm2
S' Lateral: 3 cm

## 2021-05-22 LAB — TSH: TSH: 0.35 u[IU]/mL (ref 0.350–4.500)

## 2021-05-22 LAB — HEPARIN LEVEL (UNFRACTIONATED)
Heparin Unfractionated: 0.94 IU/mL — ABNORMAL HIGH (ref 0.30–0.70)
Heparin Unfractionated: 0.52 IU/mL (ref 0.30–0.70)

## 2021-05-22 MED ORDER — ALBUTEROL SULFATE (2.5 MG/3ML) 0.083% IN NEBU
2.5000 mg | INHALATION_SOLUTION | Freq: Four times a day (QID) | RESPIRATORY_TRACT | Status: DC
  Administered 2021-05-22 – 2021-05-23 (×4): 2.5 mg via RESPIRATORY_TRACT
  Filled 2021-05-22 (×4): qty 3

## 2021-05-22 MED ORDER — ACETAMINOPHEN 325 MG PO TABS: 650.0000 mg | ORAL_TABLET | Freq: Four times a day (QID) | ORAL | Status: DC | PRN

## 2021-05-22 MED ORDER — ALBUTEROL SULFATE (2.5 MG/3ML) 0.083% IN NEBU: 2.5000 mg | INHALATION_SOLUTION | RESPIRATORY_TRACT | Status: DC | PRN

## 2021-05-22 MED ORDER — TRIAMCINOLONE ACETONIDE 0.5 % EX CREA: TOPICAL_CREAM | Freq: Two times a day (BID) | CUTANEOUS | Status: DC | PRN

## 2021-05-22 MED ORDER — BUDESONIDE 0.5 MG/2ML IN SUSP
0.5000 mg | Freq: Two times a day (BID) | RESPIRATORY_TRACT | Status: DC
  Administered 2021-05-22 – 2021-05-24 (×5): 0.5 mg via RESPIRATORY_TRACT
  Filled 2021-05-22 (×5): qty 2

## 2021-05-22 MED ORDER — HYDROXYZINE HCL 10 MG PO TABS
10.0000 mg | ORAL_TABLET | Freq: Every evening | ORAL | Status: DC | PRN
  Filled 2021-05-22: qty 2

## 2021-05-22 MED ORDER — HEPARIN BOLUS VIA INFUSION
4000.0000 [IU] | Freq: Once | INTRAVENOUS | Status: AC
  Administered 2021-05-22: 4000 [IU] via INTRAVENOUS
  Filled 2021-05-22: qty 4000

## 2021-05-22 MED ORDER — ARFORMOTEROL TARTRATE 15 MCG/2ML IN NEBU
15.0000 ug | INHALATION_SOLUTION | Freq: Two times a day (BID) | RESPIRATORY_TRACT | Status: DC
  Administered 2021-05-22 – 2021-05-24 (×5): 15 ug via RESPIRATORY_TRACT
  Filled 2021-05-22 (×5): qty 2

## 2021-05-22 MED ORDER — ACETAMINOPHEN 650 MG RE SUPP: 650.0000 mg | Freq: Four times a day (QID) | RECTAL | Status: DC | PRN

## 2021-05-22 MED ORDER — EZETIMIBE 10 MG PO TABS
10.0000 mg | ORAL_TABLET | Freq: Every day | ORAL | Status: DC
  Administered 2021-05-22 – 2021-05-24 (×3): 10 mg via ORAL
  Filled 2021-05-22 (×3): qty 1

## 2021-05-22 MED ORDER — ALLOPURINOL 300 MG PO TABS
300.0000 mg | ORAL_TABLET | Freq: Every day | ORAL | Status: DC
  Administered 2021-05-22 – 2021-05-24 (×3): 300 mg via ORAL
  Filled 2021-05-22: qty 1
  Filled 2021-05-22: qty 3
  Filled 2021-05-22: qty 1

## 2021-05-22 MED ORDER — METHYLPREDNISOLONE SODIUM SUCC 125 MG IJ SOLR
90.0000 mg | Freq: Two times a day (BID) | INTRAMUSCULAR | Status: DC
  Administered 2021-05-22: 90 mg via INTRAVENOUS
  Filled 2021-05-22: qty 2

## 2021-05-22 MED ORDER — PREDNISONE 20 MG PO TABS
40.0000 mg | ORAL_TABLET | Freq: Every day | ORAL | Status: DC
  Administered 2021-05-23: 40 mg via ORAL
  Filled 2021-05-22: qty 2

## 2021-05-22 MED ORDER — ATORVASTATIN CALCIUM 80 MG PO TABS
80.0000 mg | ORAL_TABLET | Freq: Every evening | ORAL | Status: DC
  Administered 2021-05-22 – 2021-05-23 (×2): 80 mg via ORAL
  Filled 2021-05-22 (×2): qty 1

## 2021-05-22 MED ORDER — SODIUM CHLORIDE 0.9% FLUSH
3.0000 mL | Freq: Two times a day (BID) | INTRAVENOUS | Status: DC
  Administered 2021-05-22 – 2021-05-23 (×4): 3 mL via INTRAVENOUS

## 2021-05-22 MED ORDER — ASPIRIN EC 81 MG PO TBEC
81.0000 mg | DELAYED_RELEASE_TABLET | Freq: Every day | ORAL | Status: DC
  Administered 2021-05-22 – 2021-05-24 (×3): 81 mg via ORAL
  Filled 2021-05-22 (×3): qty 1

## 2021-05-22 MED ORDER — HEPARIN (PORCINE) 25000 UT/250ML-% IV SOLN
1200.0000 [IU]/h | INTRAVENOUS | Status: DC
  Administered 2021-05-22: 1300 [IU]/h via INTRAVENOUS
  Administered 2021-05-22: 1150 [IU]/h via INTRAVENOUS
  Administered 2021-05-23: 1200 [IU]/h via INTRAVENOUS
  Filled 2021-05-22 (×3): qty 250

## 2021-05-22 MED ORDER — ONDANSETRON HCL 4 MG PO TABS: 4.0000 mg | ORAL_TABLET | Freq: Four times a day (QID) | ORAL | Status: DC | PRN

## 2021-05-22 MED ORDER — MELATONIN 5 MG PO TABS: 10.0000 mg | ORAL_TABLET | Freq: Every evening | ORAL | Status: DC | PRN

## 2021-05-22 MED ORDER — ONDANSETRON HCL 4 MG/2ML IJ SOLN: 4.0000 mg | Freq: Four times a day (QID) | INTRAMUSCULAR | Status: DC | PRN

## 2021-05-22 MED ORDER — IOHEXOL 350 MG/ML SOLN
100.0000 mL | Freq: Once | INTRAVENOUS | Status: AC | PRN
  Administered 2021-05-22: 100 mL via INTRAVENOUS

## 2021-05-22 NOTE — Progress Notes (Signed)
Bilateral lower extremity venous duplex completed. Refer to "CV Proc" under chart review to view preliminary results.  05/22/2021 9:13 AM Eula Fried., MHA, RVT, RDCS, RDMS

## 2021-05-22 NOTE — Progress Notes (Signed)
ANTICOAGULATION CONSULT NOTE   Pharmacy Consult for heparin Indication: pulmonary embolus  Allergies  Allergen Reactions   Almond Meal Anaphylaxis and Swelling   Almond Oil Anaphylaxis and Swelling   Ace Inhibitors Cough    Patient Measurements: Height: 5\' 4"  (162.6 cm) Weight: 90 kg (198 lb 6.6 oz) IBW/kg (Calculated) : 59.2 Heparin Dosing Weight: 80kg  Vital Signs: Temp: 98.7 F (37.1 C) (08/10 0635) BP: 126/50 (08/10 1130) Pulse Rate: 81 (08/10 1130)  Labs: Recent Labs    05/21/21 1935 05/21/21 2159 05/22/21 1130  HGB 14.1  --   --   HCT 43.3  --   --   PLT 245  --   --   HEPARINUNFRC  --   --  0.94*  CREATININE 1.23  --   --   TROPONINIHS 8 9  --      Estimated Creatinine Clearance: 58.1 mL/min (by C-G formula based on SCr of 1.23 mg/dL).   Medical History: Past Medical History:  Diagnosis Date   Acute ST elevation myocardial infarction (STEMI) of inferior wall (HCC) 12/06/2019   Asthma    CAD (coronary artery disease)    a. 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 EF 65% at cath 8/08   Coronary artery disease involving coronary bypass graft of native heart with angina pectoris [facility]) 12/07/2019   12/06/2019: Inferior STEMI-thrombotic occlusion of SVG-OM treated with rheolytic (AngioJet) thrombectomy and 2 site DES PCI.   History of cardiac monitoring    Cardiac Monitor 02/2020: NSR, avg HR 65, no PVCs, no AF   HLD (hyperlipidemia)    HTN (hypertension)    STEMI (ST elevation myocardial infarction) (HCC) 12/06/2019   Testosterone deficiency     Assessment: 70yo male c/o SOB and chest discomfort x3d, CT reveals PE, to begin heparin.  Heparin level supratherapeutic at 0.94, no bleeding observed  Goal of Therapy:  Heparin level 0.3-0.7 units/ml Monitor platelets by anticoagulation protocol: Yes   Plan:  Decrease heparin gtt to 1150 units/hr F/u 6 hour heparin level  70yo, PharmD Clinical Pharmacist ED Pharmacist  Phone # [phone] 05/22/2021 1:22 PM

## 2021-05-22 NOTE — Progress Notes (Signed)
ANTICOAGULATION CONSULT NOTE - Initial Consult  Pharmacy Consult for heparin Indication: pulmonary embolus  Allergies  Allergen Reactions   Almond Meal Anaphylaxis and Swelling   Almond Oil Anaphylaxis and Swelling   Ace Inhibitors Cough    Patient Measurements: Height: 5\' 4"  (162.6 cm) Weight: 90 kg (198 lb 6.6 oz) IBW/kg (Calculated) : 59.2 Heparin Dosing Weight: 80kg  Vital Signs: Temp: 99.3 F (37.4 C) (08/09 1919) Temp Source: Oral (08/09 1919) BP: 127/53 (08/10 0415) Pulse Rate: 93 (08/10 0415)  Labs: Recent Labs    05/21/21 1935 05/21/21 2159  HGB 14.1  --   HCT 43.3  --   PLT 245  --   CREATININE 1.23  --   TROPONINIHS 8 9    Estimated Creatinine Clearance: 58.1 mL/min (by C-G formula based on SCr of 1.23 mg/dL).   Medical History: Past Medical History:  Diagnosis Date   Acute ST elevation myocardial infarction (STEMI) of inferior wall (HCC) 12/06/2019   Asthma    CAD (coronary artery disease)    a. 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 EF 65% at cath 8/08   Coronary artery disease involving coronary bypass graft of native heart with angina pectoris [facility]) 12/07/2019   12/06/2019: Inferior STEMI-thrombotic occlusion of SVG-OM treated with rheolytic (AngioJet) thrombectomy and 2 site DES PCI.   History of cardiac monitoring    Cardiac Monitor 02/2020: NSR, avg HR 65, no PVCs, no AF   HLD (hyperlipidemia)    HTN (hypertension)    STEMI (ST elevation myocardial infarction) (HCC) 12/06/2019   Testosterone deficiency     Assessment: 70yo male c/o SOB and chest discomfort x3d, CT reveals PE, to begin heparin.  Goal of Therapy:  Heparin level 0.3-0.7 units/ml Monitor platelets by anticoagulation protocol: Yes   Plan:  Heparin 4000 units IV bolus x1 followed by infusion at 1300 units/hr and monitor heparin levels and CBC.  70yo, PharmD, BCPS  05/22/2021,4:39 AM

## 2021-05-22 NOTE — ED Provider Notes (Signed)
Patient signed out to me to follow-up on remainder of work-up.  Patient presented to the emergency department for evaluation of cough, wheezing and shortness of breath that has been ongoing for 3 days.  At time of signout, CT angiography was pending.  This has been reviewed and does reveal evidence of PE.  Shortness of breath and new oxygen demand is likely multifactorial including asthma, bronchospasm, PE.  Will require admission for further work-up.   Gilda Crease, MD 05/22/21 [phone]

## 2021-05-22 NOTE — Consult Note (Addendum)
Cardiology Consultation:   Patient ID: Kevin Guzman MRN: [id number]; DOB: 27-Oct-1950  Admit date: 05/21/2021 Date of Consult: 05/22/2021  PCP:  Kevin Au, MD   [facility] Providers Cardiologist:  Kevin Bollman, MD   {  Patient Profile:   Kevin Guzman is a 70 y.o. male with a history of CAD s/p CABG x4 in 2008 with inferior STEMI in 11/2019 treated with DES x2, palpitations with prior PACs/PVCs, noted hypertension, hyperlipidemia, and remote tobacco use (quit in 2000) who is being seen 05/22/2021 for question on dual antiplatelet therapy in setting of new pulmonary embolism at the request of Kevin Guzman.  History of Present Illness:   Kevin Guzman is a 70 year old male with the above history who is followed by Dr. Excell Guzman. Patient has a history of CAD with remote CABG x4 (LIMA-LAD, SVG-Diag, SVG-OM, SVG-Ramus) in 2008. He was admitted with an inferior STEMI in 11/2019 and was treated with thrombectomy and DES x2 to SVG-OM. Echo showed LVEF of 60-65% with normal wall motion at the time. He has been on DAPT with Aspirin and Effient since that time. Patient was last seen by Kevin Carson, PA-C, in 07/2020 at which time he was doing well overall from a cardiac standpoint. He has occasional palpitations which is managed with low dose Bisoprolol give soft BP.  Patient presented to the ED on 05/21/2021 via EMS for further evaluation of shortness of breath. Patient reports he was in his usual state of health until Monday, 05/20/2021 when he developed a cough which was mildly productive as well as nasal congestion. He thought he may have had a cold but also stated that he often gets a cough when he has an asthma exacerbation.  The following day he became acutely short of breath and had no significant improvement with breathing treatment at home.  Shortness of breath continued to progress and he started to sweat and developed a headache and blurry vision.  He states he just felt out of it.  No syncope. 911 was called and upon EMS arrival, O2 sat of 88% on room air. He was place don 4L via nasal cannula with improvement. No chest pain. No shortness of breath prior to this or orthopnea. He reports possible PND on Sunday morning but this was an isolated event. He notes some mild bilateral lower extremity edema for the last couple of months which is stable. He has occasional brief episodes of palpitations which are stable. No associated symptoms with the palpitations. No recent fevers. He bruises easily on the DAPT but denies any abnormal bleeding in urine stools.    Upon arrival to the ED, patient intermittently hypotensive. EKG showed normal sinus rhythm with RBBB and ST depression in V3-V5. High-sensitivity troponin negative x2. BNP negative. Chest x-ray showed no acute findings. D-dimer negative. Chest CTA showed mild PE in the right lower lobe with evidence of right heart strain similar to prior exam in 2019. Degree of heart strain greater than expected for small volume PE. WBC 8.7, Hgb 14.1, Plts 245. Na 140, K 4.0, Glucose 112, BUN 14, Cr 1.23. LFTs normal.  Past Medical History:  Diagnosis Date   Acute ST elevation myocardial infarction (STEMI) of inferior wall (HCC) 12/06/2019   Asthma    CAD (coronary artery disease)    a. 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 EF 65% at cath 8/08   Coronary artery disease involving coronary bypass graft of native heart with  angina pectoris [facility]) 12/07/2019   12/06/2019: Inferior STEMI-thrombotic occlusion of SVG-OM treated with rheolytic (AngioJet) thrombectomy and 2 site DES PCI.   History of cardiac monitoring    Cardiac Monitor 02/2020: NSR, avg HR 65, no PVCs, no AF   HLD (hyperlipidemia)    HTN (hypertension)    STEMI (ST elevation myocardial infarction) (HCC) 12/06/2019   Testosterone deficiency     Past Surgical History:  Procedure Laterality Date   CORONARY ARTERY BYPASS GRAFT  2008   LIMA-LAD, SVG-diagonal,  SVG-OM, SVG-ramus   CORONARY/GRAFT ACUTE MI REVASCULARIZATION N/A 12/06/2019   Procedure: Coronary/Graft Acute MI Revascularization;  Surgeon: Kevin Bollman, MD;  Location[facility] INVASIVE CV LAB;  Service: Cardiovascular;  Laterality: N/A;   HERNIA REPAIR     LEFT HEART CATH AND CORONARY ANGIOGRAPHY N/A 12/06/2019   Procedure: LEFT HEART CATH AND CORONARY ANGIOGRAPHY;  Surgeon: Kevin Bollman, MD;  Location: [facility] INVASIVE CV LAB;  Service: Cardiovascular;  Laterality: N/A;   TEMPORARY PACEMAKER N/A 12/06/2019   Procedure: TEMPORARY PACEMAKER;  Surgeon: Kevin Bollman, MD;  Location: [facility] INVASIVE CV LAB;  Service: Cardiovascular;  Laterality: N/A;     Home Medications:  Prior to Admission medications   Medication Sig Start Date End Date Taking? Authorizing Provider  albuterol (PROVENTIL HFA;VENTOLIN HFA) 108 (90 Base) MCG/ACT inhaler Inhale 2 puffs into the lungs every 4 (four) hours as needed for wheezing or shortness of breath.    Yes [provider]  albuterol (PROVENTIL) (2.5 MG/3ML) 0.083% nebulizer solution Take 2.5 mg by nebulization every 6 (six) hours as needed for wheezing or shortness of breath.   Yes [provider]  allopurinol (ZYLOPRIM) 300 MG tablet Take 300 mg by mouth daily.   Yes [provider]  aspirin 81 MG EC tablet Take 81 mg by mouth daily.     Yes [provider]  atorvastatin (LIPITOR) 80 MG tablet TAKE 1 TABLET (80 MG TOTAL) BY MOUTH DAILY AT 6 PM. Patient taking differently: Take 80 mg by mouth every evening. 10/28/16  Yes Kevin Bollman, MD  betamethasone dipropionate 0.05 % cream Apply 1 application topically 2 (two) times daily as needed (rash on legs). 11/26/20  Yes [provider]  bisoprolol (ZEBETA) 5 MG tablet Take 0.5 tablets (2.5 mg total) by mouth daily. 12/23/19  Yes Guzman, Kevin T, PA-C  diphenhydramine-acetaminophen (TYLENOL PM) 25-500 MG TABS tablet Take 1 tablet by mouth at bedtime as needed (sleep).   Yes  [provider]  ezetimibe (ZETIA) 10 MG tablet TAKE 1 TABLET BY MOUTH EVERY DAY Patient taking differently: Take 10 mg by mouth daily. 11/14/20  Yes Kevin Bollman, MD  fluticasone furoate-vilanterol (BREO ELLIPTA) 100-25 MCG/INH AEPB Inhale 1 puff into the lungs as needed (SOB/wheezing).    Yes [provider]  hydrOXYzine (ATARAX/VISTARIL) 10 MG tablet Take 10-20 mg by mouth at bedtime as needed for anxiety. 11/26/20  Yes [provider]  Melatonin 10 MG CAPS Take 10 mg by mouth at bedtime as needed (for sleep).   Yes [provider]  nitroGLYCERIN (NITROSTAT) 0.4 MG SL tablet Place 1 tablet (0.4 mg total) under the tongue every 5 (five) minutes x 3 doses as needed for chest pain. 12/08/19  Yes Laverda Page B, NP  prasugrel (EFFIENT) 10 MG TABS tablet TAKE ONE TABLET BY MOUTH DAILY Patient taking differently: Take 10 mg by mouth daily. 04/22/21  Yes Kevin Bollman, MD    Inpatient Medications: Scheduled Meds:  albuterol  2.5 mg Nebulization QID  allopurinol  300 mg Oral Daily   arformoterol  15 mcg Nebulization BID   aspirin EC  81 mg Oral Daily   atorvastatin  80 mg Oral QPM   budesonide (PULMICORT) nebulizer solution  0.5 mg Nebulization BID   ezetimibe  10 mg Oral Daily   [START ON 05/23/2021] predniSONE  40 mg Oral Q breakfast   sodium chloride flush  3 mL Intravenous Q12H   Continuous Infusions:  sodium chloride 1,000 mL (05/22/21 0738)   heparin 1,150 Units/hr (05/22/21 1328)   PRN Meds: acetaminophen **OR** acetaminophen, albuterol, hydrOXYzine, melatonin, ondansetron **OR** ondansetron (ZOFRAN) IV, triamcinolone cream  Allergies:    Allergies  Allergen Reactions   Almond Meal Anaphylaxis and Swelling   Almond Oil Anaphylaxis and Swelling   Ace Inhibitors Cough    Social History:   Social History   Socioeconomic History   Marital status: Married    Spouse name: Not on file   Number of children: Not on file   Years of  education: 14   Highest education level: Associate degree: academic program  Occupational History   Not on file  Tobacco Use   Smoking status: Former   Smokeless tobacco: Former    Quit date: 10/13/1998  Vaping Use   Vaping Use: Never used  Substance and Sexual Activity   Alcohol use: No   Drug use: No   Sexual activity: Not on file  Other Topics Concern   Not on file  Social History Narrative   Not on file   Social Determinants of Health   Financial Resource Strain: Not on file  Food Insecurity: Not on file  Transportation Needs: Not on file  Physical Activity: Not on file  Stress: Not on file  Social Connections: Not on file  Intimate Partner Violence: Not on file    Family History:   Family History  Problem Relation Age of Onset   Cancer Mother    Heart attack Neg Hx    Stroke Neg Hx      ROS:  Please see the history of present illness.  All other ROS reviewed and negative.     Physical Exam/Data:   Vitals:   05/22/21 1100 05/22/21 1115 05/22/21 1130 05/22/21 1330  BP: (!) 125/54 (!) 125/49 (!) 126/50 (!) 110/45  Pulse: 73 70 81 85  Resp: (!) 24 (!) 22 20 (!) 24  Temp:      TempSrc:      SpO2: 92% 92% 91% 93%  Weight:      Height:        Intake/Output Summary (Last 24 hours) at 05/22/2021 1558 Last data filed at 05/22/2021 0727 Gross per 24 hour  Intake 1000 ml  Output 500 ml  Net 500 ml   Last 3 Weights 05/22/2021 08/01/2020 03/14/2020  Weight (lbs) 198 lb 6.6 oz 201 lb 9.6 oz 204 lb 9.4 oz  Weight (kg) 90 kg 91.445 kg 92.8 kg     Body mass index is 34.06 kg/m.  General: 70 y.o. male resting comfortably in no acute distress. HEENT: Normocephalic and atraumatic. Sclera clear.  Neck: Supple. No JVD. Heart: RRR. Distinct S1 and S2. No murmurs, gallops, or rubs. Radial pulses 2+ and equal bilaterally. Lungs: No increased work of breathing. Faint scattered rhonchi/wheezing. No significant crackles. Abdomen: Soft, non-distended, and non-tender to  palpation. Bowel sounds present. MSK: Normal strength and tone for age. Extremities: Trace lower extremity of bilateral lower extremities. Skin: Warm and dry. Neuro: Alert and oriented x3. No  focal deficits. Psych: Normal affect. Responds appropriately.   EKG:  The EKG was personally reviewed and demonstrates:  Normal sinus rhythm, rate 89 bpm, with RBBB and mild ST depression in V3-V5.  Telemetry:  Telemetry was personally reviewed and demonstrates:  Normal sinus rhythm in the 80s.  Relevant CV Studies:  Echocardiogram 05/22/2021: Impressions: 1. Left ventricular ejection fraction, by estimation, is 60 to 65%. The  left ventricle has normal function. The left ventricle has no regional  wall motion abnormalities. There is mild asymmetric left ventricular  hypertrophy of the basal-septal segment.  Left ventricular diastolic parameters are consistent with Grade I  diastolic dysfunction (impaired relaxation).   2. Right ventricular systolic function is low normal. The right  ventricular size is normal. There is mildly elevated pulmonary artery  systolic pressure. The estimated right ventricular systolic pressure is  43.0 mmHg.   3. The mitral valve is grossly normal. No evidence of mitral valve  regurgitation.   4. The aortic valve is tricuspid. Aortic valve regurgitation is not  visualized. Mild aortic valve sclerosis is present, with no evidence of  aortic valve stenosis.   5. The inferior vena cava is dilated in size with >50% respiratory  variability, suggesting right atrial pressure of 8 mmHg.   Comparison(s): Changes from prior study are noted. 12/07/2019: LVEF 60-65%.  _______________  Lower Extremity Dopplers 05/22/2021 (Preliminary Read): Summary:  RIGHT:  - There is no evidence of deep vein thrombosis in the lower extremity.  - No cystic structure found in the popliteal fossa.     LEFT:  - There is no evidence of deep vein thrombosis in the lower extremity.  - No  cystic structure found in the popliteal fossa.    Laboratory Data:  High Sensitivity Troponin:   Recent Labs  Lab 05/21/21 1935 05/21/21 2159  TROPONINIHS 8 9     Chemistry Recent Labs  Lab 05/21/21 1935  NA 140  K 4.0  CL 103  CO2 26  GLUCOSE 112*  BUN 14  CREATININE 1.23  CALCIUM 9.1  GFRNONAA >60  ANIONGAP 11    Recent Labs  Lab 05/21/21 1935  PROT 6.9  ALBUMIN 4.0  AST 24  ALT 21  ALKPHOS 114  BILITOT 0.9   Hematology Recent Labs  Lab 05/21/21 1935  WBC 8.7  RBC 4.79  HGB 14.1  HCT 43.3  MCV 90.4  MCH 29.4  MCHC 32.6  RDW 13.0  PLT 245   BNP Recent Labs  Lab 05/21/21 1935  BNP 91.5    DDimer  Recent Labs  Lab 05/22/21 0206  DDIMER 0.38     Radiology/Studies:  CT Angio Chest PE W and/or Wo Contrast  Result Date: 05/22/2021 CLINICAL DATA:  Shortness of breath for 3 days EXAM: CT ANGIOGRAPHY CHEST WITH CONTRAST TECHNIQUE: Multidetector CT imaging of the chest was performed using the standard protocol during bolus administration of intravenous contrast. Multiplanar CT image reconstructions and MIPs were obtained to evaluate the vascular anatomy. CONTRAST:  OMNIPAQUE IOHEXOL 350 MG/ML SOLN COMPARISON:  03/10/2018 FINDINGS: Cardiovascular: Thoracic aorta and its branches demonstrate atherosclerotic calcifications. No aneurysmal dilatation is seen. Changes consistent with prior coronary bypass grafting are noted. Aortic valve calcifications are noted. No cardiac enlargement is seen. Coronary calcifications are noted. The pulmonary artery shows a normal branching pattern bilaterally. Filling defects are noted within the right lower lobe pulmonary arterial branches consistent with acute pulmonary emboli. Right heart strain is noted within RV/LV ratio of 1.5 although this appears  similar to that seen on prior examination. It also appears greater than that expected for the relative small volume of pulmonary emboli. Mediastinum/Nodes: Thoracic inlet  is within normal limits. Scattered small mediastinal lymph nodes are noted likely reactive in nature. A few small hilar lymph nodes are noted as well. Esophagus as visualized is within normal limits. Lungs/Pleura: Lungs are well aerated bilaterally. Mild bibasilar atelectatic changes are seen. No focal confluent infiltrate is noted. No sizable effusion is seen. No parenchymal nodules are noted. Upper Abdomen: No acute abnormality. Musculoskeletal: Degenerative changes of the thoracic spine are seen. No acute rib abnormality is noted. Review of the MIP images confirms the above findings. IMPRESSION: Mild pulmonary emboli within the right lower lobe. Changes of right heart strain as described although they appears similar to that seen on prior exam from 2019 and likely a longstanding abnormality. The degree of heart strain is also greater than that expected for the small volume of pulmonary emboli. Bibasilar atelectatic changes. Aortic Atherosclerosis (ICD10-I70.0). Critical Value/emergent results were called by telephone at the time of interpretation on 05/22/2021 at 3:52 am to Dr. Blinda Leatherwood, who verbally acknowledged these results. Electronically Signed   By: Alcide Clever M.D.   On: 05/22/2021 03:53   DG Chest Portable 1 View  Result Date: 05/21/2021 CLINICAL DATA:  Shortness of breath EXAM: PORTABLE CHEST 1 VIEW COMPARISON:  12/06/2019 FINDINGS: Post sternotomy changes. No focal opacity or pleural effusion. Normal cardiomediastinal silhouette with aortic atherosclerosis. No pneumothorax IMPRESSION: No active disease. Electronically Signed   By: Jasmine Pang M.D.   On: 05/21/2021 20:28   ECHOCARDIOGRAM COMPLETE  Result Date: 05/22/2021    ECHOCARDIOGRAM REPORT   Patient Name:   KINCAID TIGER Date of Exam: 05/22/2021 Medical Rec #:  [id number]         Height:       64.0 in Accession #:    [id number]        Weight:       198.4 lb Date of Birth:  [birth year]        BSA:          1.950 m Patient Age:    69 years           BP:           119/71 mmHg Patient Gender: M                 HR:           76 bpm. Exam Location:  Inpatient Procedure: 2D Echo, Cardiac Doppler and Color Doppler Indications:    I26.02 Pulmonary embolus  History:        Patient has prior history of Echocardiogram examinations, most                 recent 12/07/2019. CAD and Previous Myocardial Infarction; Risk                 Factors:Hypertension and Dyslipidemia.  Sonographer:    Tiffany Dance Referring Phys: [id number] RONDELL A SMITH IMPRESSIONS  1. Left ventricular ejection fraction, by estimation, is 60 to 65%. The left ventricle has normal function. The left ventricle has no regional wall motion abnormalities. There is mild asymmetric left ventricular hypertrophy of the basal-septal segment. Left ventricular diastolic parameters are consistent with Grade I diastolic dysfunction (impaired relaxation).  2. Right ventricular systolic function is low normal. The right ventricular size is normal. There is mildly elevated pulmonary artery systolic pressure. The estimated right ventricular systolic pressure  is 43.0 mmHg.  3. The mitral valve is grossly normal. No evidence of mitral valve regurgitation.  4. The aortic valve is tricuspid. Aortic valve regurgitation is not visualized. Mild aortic valve sclerosis is present, with no evidence of aortic valve stenosis.  5. The inferior vena cava is dilated in size with >50% respiratory variability, suggesting right atrial pressure of 8 mmHg. Comparison(s): Changes from prior study are noted. 12/07/2019: LVEF 60-65%. FINDINGS  Left Ventricle: Left ventricular ejection fraction, by estimation, is 60 to 65%. The left ventricle has normal function. The left ventricle has no regional wall motion abnormalities. The left ventricular internal cavity size was normal in size. There is  mild asymmetric left ventricular hypertrophy of the basal-septal segment. Left ventricular diastolic parameters are consistent with Grade I  diastolic dysfunction (impaired relaxation). Indeterminate filling pressures. Right Ventricle: The right ventricular size is normal. No increase in right ventricular wall thickness. Right ventricular systolic function is low normal. There is mildly elevated pulmonary artery systolic pressure. The tricuspid regurgitant velocity is 2.96 m/s, and with an assumed right atrial pressure of 8 mmHg, the estimated right ventricular systolic pressure is 43.0 mmHg. Left Atrium: Left atrial size was normal in size. Right Atrium: Right atrial size was normal in size. Pericardium: There is no evidence of pericardial effusion. Mitral Valve: The mitral valve is grossly normal. No evidence of mitral valve regurgitation. Tricuspid Valve: The tricuspid valve is grossly normal. Tricuspid valve regurgitation is trivial. Aortic Valve: The aortic valve is tricuspid. Aortic valve regurgitation is not visualized. Mild aortic valve sclerosis is present, with no evidence of aortic valve stenosis. Pulmonic Valve: The pulmonic valve was normal in structure. Pulmonic valve regurgitation is not visualized. Aorta: The aortic root and ascending aorta are structurally normal, with no evidence of dilitation. Venous: The inferior vena cava is dilated in size with greater than 50% respiratory variability, suggesting right atrial pressure of 8 mmHg. IAS/Shunts: No atrial level shunt detected by color flow Doppler.  LEFT VENTRICLE PLAX 2D LVIDd:         4.10 cm  Diastology LVIDs:         3.00 cm  LV e' medial:    5.66 cm/s LV PW:         1.00 cm  LV E/e' medial:  16.1 LV IVS:        1.20 cm  LV e' lateral:   8.81 cm/s LVOT diam:     2.10 cm  LV E/e' lateral: 10.4 LV SV:         86 LV SV Index:   44 LVOT Area:     3.46 cm  RIGHT VENTRICLE            IVC RV Basal diam:  4.10 cm    IVC diam: 2.40 cm RV Mid diam:    2.90 cm RV S prime:     9.90 cm/s TAPSE (M-mode): 1.8 cm LEFT ATRIUM             Index       RIGHT ATRIUM           Index LA diam:        3.90  cm 2.00 cm/m  RA Area:     14.90 cm LA Vol (A2C):   46.0 ml 23.59 ml/m RA Volume:   37.00 ml  18.98 ml/m LA Vol (A4C):   38.0 ml 19.49 ml/m LA Biplane Vol: 42.9 ml 22.00 ml/m  AORTIC VALVE LVOT Vmax:   125.00  cm/s LVOT Vmean:  81.750 cm/s LVOT VTI:    0.248 m  AORTA Ao Root diam: 3.50 cm Ao Asc diam:  3.20 cm MITRAL VALVE                TRICUSPID VALVE MV Area (PHT): 3.99 cm     TR Peak grad:   35.0 mmHg MV Decel Time: 190 msec     TR Vmax:        296.00 cm/s MV E velocity: 91.30 cm/s MV A velocity: 109.00 cm/s  SHUNTS MV E/A ratio:  0.84         Systemic VTI:  0.25 m                             Systemic Diam: 2.10 cm Zoila Shutter MD Electronically signed by Zoila Shutter MD Signature Date/Time: 05/22/2021/12:05:37 PM    Final    VAS [place] LOWER EXTREMITY VENOUS (DVT)  Result Date: 05/22/2021  Lower Venous DVT Study Patient Name:  TYRIE PORZIO  Date of Exam:   05/22/2021 Medical Rec #: [id number]          Accession #:    [id number] Date of Birth: [birth year]         Patient Gender: M Patient Age:   43 years Exam Location:  [facility] Procedure:      VAS [place] LOWER EXTREMITY VENOUS (DVT) Referring Phys: Madelyn Flavors --------------------------------------------------------------------------------  Indications: Pulmonary embolism.  Comparison Study: No prior study Performing Technologist: Gertie Fey MHA, RDMS, RVT, RDCS  Examination Guidelines: A complete evaluation includes B-mode imaging, spectral Doppler, color Doppler, and power Doppler as needed of all accessible portions of each vessel. Bilateral testing is considered an integral part of a complete examination. Limited examinations for reoccurring indications may be performed as noted. The reflux portion of the exam is performed with the patient in reverse Trendelenburg.  +---------+---------------+---------+-----------+----------+--------------+ RIGHT    CompressibilityPhasicitySpontaneityPropertiesThrombus Aging  +---------+---------------+---------+-----------+----------+--------------+ CFV      Full           Yes      Yes                                 +---------+---------------+---------+-----------+----------+--------------+ SFJ      Full                                                        +---------+---------------+---------+-----------+----------+--------------+ FV Prox  Full                                                        +---------+---------------+---------+-----------+----------+--------------+ FV Mid   Full                                                        +---------+---------------+---------+-----------+----------+--------------+ FV DistalFull                                                        +---------+---------------+---------+-----------+----------+--------------+  PFV      Full                                                        +---------+---------------+---------+-----------+----------+--------------+ POP      Full           Yes      Yes                                 +---------+---------------+---------+-----------+----------+--------------+ PTV      Full                                                        +---------+---------------+---------+-----------+----------+--------------+ PERO     Full                                                        +---------+---------------+---------+-----------+----------+--------------+   +---------+---------------+---------+-----------+----------+--------------+ LEFT     CompressibilityPhasicitySpontaneityPropertiesThrombus Aging +---------+---------------+---------+-----------+----------+--------------+ CFV      Full           Yes      Yes                                 +---------+---------------+---------+-----------+----------+--------------+ SFJ      Full                                                         +---------+---------------+---------+-----------+----------+--------------+ FV Prox  Full                                                        +---------+---------------+---------+-----------+----------+--------------+ FV Mid   Full                                                        +---------+---------------+---------+-----------+----------+--------------+ FV DistalFull                                                        +---------+---------------+---------+-----------+----------+--------------+ PFV      Full                                                        +---------+---------------+---------+-----------+----------+--------------+  POP      Full           Yes      Yes                                 +---------+---------------+---------+-----------+----------+--------------+ PTV      Full                                                        +---------+---------------+---------+-----------+----------+--------------+ PERO     Full                                                        +---------+---------------+---------+-----------+----------+--------------+     Summary: RIGHT: - There is no evidence of deep vein thrombosis in the lower extremity.  - No cystic structure found in the popliteal fossa.  LEFT: - There is no evidence of deep vein thrombosis in the lower extremity.  - No cystic structure found in the popliteal fossa.  *See table(s) above for measurements and observations.    Preliminary      Assessment and Plan:   Acute Hypoxic Respiratory Failure Acute PE Acute Asthma Exacerbation - Patient presented with acute shortness of breath. - Chest x-ray showed no acute findings.  - BNP normal.  - High-sensitivity troponin negative x2. - COVID-19 testing negative. - Despite negative D-dimer, Chest CTA showed mild PE in the right lower lobe with evidence of right heart strain similar to prior exam in 2019. Degree of heart strain greater  than expected for small volume PE. Echo shows LVEF of 60-65% with normal wall motion. RV normal with mildly elevated PASP of 43 mmHg. Started on IV Heparin. Cardiology consulted to give recommendation on DAPT with need for anticoagulation. He is 1.5 years our from last PCI. Therefore, can stop Effient and continue Aspirin. Can start Eliquis for PE.  CAD s/p CABG - S/p CABG x4 in 2008. Subsequent STEMI in 11/2019 s/p DES x2 to SVG to OM1. Patent LIMA to LAD and SVG to Diag at that time.  - No chest pain.  - On DAPT with Aspirin and Effient at home. Can stop Effient that patient will need anticoagulation. - Hold home Bisoprolol given transient hypotension. - Continue Lipitor 80mg  daily and Zetia 10mg  daily.  Palpitations - Chronic and stable. Previous monitor showed rare PAC/PVC. No associated symptoms with this. - On Bisoprolol 2.5mg  daily at home. This is currently being held due to hypotension. Can restart when BP stabilizes.  Hypotensive - Patient has soft BP at baseline and has had transient hypotension since admission. Systolic BP as low as the 70s. Improved with IV fluids. - Still on 125cc/hr of NaCl - OK to continue to hold Bisoprolol for now.   Hyperlipidemia - Continue Lipitor 80mg  daily and Zetia 10mg  daily.   Risk Assessment/Risk Scores:    For questions or updates, please contact [facility] Please consult [email or web address] for contact info under    Signed, , PA-C  05/22/2021 3:58 PM  I have personally seen and examined this patient. I agree with the assessment and plan as  outlined above.  70 yo male with history of CAD s/p cABG with Inferior STEMI in February 2021 with placement of 2 DES in the SVG to OM at that time. Now admitted with hypoxic respiratory failure secondary to asthma exacerbation and with new finding of small PE. He is on IV heparin and we are being asked if his Effient can be stopped. He reports improvement in dyspnea since admission. No  chest pain.   Labs reviewed by me. EKG reviewed by me.    General: Well developed, well nourished, NAD  HEENT: OP clear, mucus membranes moist  SKIN: warm, dry. No rashes. Neuro: No focal deficits  Musculoskeletal: Muscle strength 5/5 all ext  Psychiatric: Mood and affect normal  Neck: No JVD, no carotid bruits, no thyromegaly, no lymphadenopathy.  Lungs:Clear bilaterally, no wheezes, rhonci, crackles Cardiovascular: Regular rate and rhythm. No murmurs, gallops or rubs. Abdomen:Soft. Bowel sounds present. Non-tender.  Extremities: No lower extremity edema. Pulses are 2 + in the bilateral DP/PT.  Plan: OK to stop Effient as it has been 18 months since his PCI/stent placement. Continue ASA 81 mg daily along with the anticoagulation.   Will sign off. Please call with questions.   Verne Carrow, MD, [facility] 05/22/2021 5:24 PM

## 2021-05-22 NOTE — H&P (Signed)
History and Physical    Kevin Guzman ZOX:[id number] DOB: Jun 05, 1951 DOA: 05/21/2021  Referring MD/NP/PA: Lyda Perone, MD PCP: Verlon Au, MD  Patient coming from: Home Via EMS  Chief Complaint: Shortness of breath  I have personally briefly reviewed patient's old medical records in [facility]   HPI: Kevin Guzman is a 70 y.o. male with medical history significant of hypertension, hyperlipidemia, CAD s/p CABG in 2008, and asthma presents with complaints of acute worsening shortness of breath which started yesterday.  He notes that he had gotten to the point where he was unable to catch his breath and was gasping for air.  At that time he denied having any complaints of chest pain. His wife reported that he was shaky, diaphoretic, and had complained subjective fever and chills for which she was fearful that he was having a heart attack.  She immediately called 911.  In route with EMS patient blood pressure was noted to be in the 70s, O2 saturations 88% on room air with improvement on 4 L of nasal cannula oxygen to 92%.   For the last couple of days patient reported that he had been having some difficulty with his breathing with a slight productive cough and wheezing.  He had tried using albuterol nebulizer with some temporary relief, but notes that he does not have a rescue inhaler.  He is on Effient and aspirin for history of coronary artery disease and notes that he bruises very easily, but denies any history of severe bleeding.  To his knowledge he has never had a blood clot before.  Upon further talks patient reports that he has intermittently had episodes of his heart fluttering over the last 4 months.  He had been evaluated by his cardiologist with a Holter monitor, but felt like he only wore it for 24 hours.  To his knowledge he has no known history of cancer and has not sedentary.  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  ED Course: Upon admission into the emergency department patient was seen to be afebrile, pulse 76-1 04, respiration 14-29, blood pressure as low as 78/49 IV fluids up to 127/53, and O2 saturations noted to be as low as 88% with improvement on 4 L of nasal cannula oxygen to at least 92%.  Labs from 8/9 were within normal limits including CBC, CMP, BNP, D-dimer, and troponins.  Chest x-ray was otherwise noted to be clear.  Despite negative D-dimer due to new oxygen demand CTA of the chest was obtained.  CTA of the chest significant for mild pulmonary emboli within the right lower lobe with changes of right heart strain more so than the degree to be expected with size of PE.  COVID-19 screening was negative.  Patient had been given 1 L normal saline IV fluids Solu-Medrol 125 mg IV, breathing treatments, and started on heparin drip with bolus.  Review of Systems  Constitutional:  Positive for chills, fever and malaise/fatigue.  HENT:  Negative for ear pain and nosebleeds.   Eyes:  Positive for blurred vision. Negative for pain.  Respiratory:  Positive for cough, sputum production, shortness of breath and wheezing.   Cardiovascular:  Positive for palpitations and leg swelling. Negative for chest pain.  Gastrointestinal:  Negative for abdominal pain,  diarrhea and vomiting.  Genitourinary:  Negative for dysuria and hematuria.  Musculoskeletal:  Positive for myalgias. Negative for falls.  Skin:  Negative for itching and rash.  Neurological:  Negative for focal weakness, loss of consciousness and weakness.  Endo/Heme/Allergies:  Bruises/bleeds easily.  Psychiatric/Behavioral:  Negative for memory loss and substance abuse.    Past Medical History:  Diagnosis Date   Acute ST elevation myocardial infarction (STEMI) of inferior wall (HCC) 12/06/2019   Asthma    CAD (coronary  artery disease)    a. 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 EF 65% at cath 8/08   Coronary artery disease involving coronary bypass graft of native heart with angina pectoris [facility]) 12/07/2019   12/06/2019: Inferior STEMI-thrombotic occlusion of SVG-OM treated with rheolytic (AngioJet) thrombectomy and 2 site DES PCI.   History of cardiac monitoring    Cardiac Monitor 02/2020: NSR, avg HR 65, no PVCs, no AF   HLD (hyperlipidemia)    HTN (hypertension)    STEMI (ST elevation myocardial infarction) (HCC) 12/06/2019   Testosterone deficiency     Past Surgical History:  Procedure Laterality Date   CORONARY ARTERY BYPASS GRAFT  2008   LIMA-LAD, SVG-diagonal, SVG-OM, SVG-ramus   CORONARY/GRAFT ACUTE MI REVASCULARIZATION N/A 12/06/2019   Procedure: Coronary/Graft Acute MI Revascularization;  Surgeon: Tonny Bollman, MD;  Location: [facility] INVASIVE CV LAB;  Service: Cardiovascular;  Laterality: N/A;   HERNIA REPAIR     LEFT HEART CATH AND CORONARY ANGIOGRAPHY N/A 12/06/2019   Procedure: LEFT HEART CATH AND CORONARY ANGIOGRAPHY;  Surgeon: Tonny Bollman, MD;  Location: [facility] INVASIVE CV LAB;  Service: Cardiovascular;  Laterality: N/A;   TEMPORARY PACEMAKER N/A 12/06/2019   Procedure: TEMPORARY PACEMAKER;  Surgeon: Tonny Bollman, MD;  Location: [facility] INVASIVE CV LAB;  Service: Cardiovascular;  Laterality: N/A;     reports that he has quit smoking. He quit smokeless tobacco use about 22 years ago. He reports that he does not drink alcohol and does not use drugs.  Allergies  Allergen Reactions   Almond Meal Anaphylaxis and Swelling   Almond Oil Anaphylaxis and Swelling   Ace Inhibitors Cough    Family History  Problem Relation Age of Onset   Cancer Mother    Heart attack Neg Hx    Stroke Neg Hx     Prior to Admission medications   Medication Sig Start Date End Date Taking? Authorizing Provider  albuterol (PROVENTIL HFA;VENTOLIN HFA) 108 (90 Base) MCG/ACT inhaler Inhale 2  puffs into the lungs every 4 (four) hours as needed for wheezing or shortness of breath.    Yes [provider]  albuterol (PROVENTIL) (2.5 MG/3ML) 0.083% nebulizer solution Take 2.5 mg by nebulization every 6 (six) hours as needed for wheezing or shortness of breath.   Yes [provider]  allopurinol (ZYLOPRIM) 300 MG tablet Take 300 mg by mouth daily.   Yes [provider]  aspirin 81 MG EC tablet Take 81 mg by mouth daily.     Yes [provider]  atorvastatin (LIPITOR) 80 MG tablet TAKE 1 TABLET (80 MG TOTAL) BY MOUTH DAILY AT 6 PM. Patient taking differently: Take 80 mg by mouth every evening. 10/28/16  Yes Tonny Bollman, MD  betamethasone dipropionate 0.05 % cream Apply 1 application topically 2 (two) times daily as needed (rash on legs). 11/26/20  Yes [provider]  bisoprolol (ZEBETA) 5 MG tablet Take 0.5 tablets (2.5 mg total) by mouth daily. 12/23/19  Yes Weaver, Scott T, PA-C  diphenhydramine-acetaminophen (TYLENOL PM) 25-500 MG TABS tablet Take 1 tablet by mouth at bedtime as needed (sleep).   Yes [provider]  ezetimibe (ZETIA) 10 MG tablet TAKE 1 TABLET BY MOUTH EVERY DAY Patient taking differently: Take 10 mg by mouth daily. 11/14/20  Yes Tonny Bollman, MD  fluticasone furoate-vilanterol (BREO ELLIPTA) 100-25 MCG/INH AEPB Inhale 1 puff into the lungs as needed (SOB/wheezing).    Yes [provider]  hydrOXYzine (ATARAX/VISTARIL) 10 MG tablet Take 10-20 mg by mouth at bedtime as needed for anxiety. 11/26/20  Yes [provider]  Melatonin 10 MG CAPS Take 10 mg by mouth at bedtime as needed (for sleep).   Yes [provider]  nitroGLYCERIN (NITROSTAT) 0.4 MG SL tablet Place 1 tablet (0.4 mg total) under the tongue every 5 (five) minutes x 3 doses as needed for chest pain. 12/08/19  Yes Laverda Page B, NP  prasugrel (EFFIENT) 10 MG TABS tablet TAKE ONE TABLET BY MOUTH DAILY Patient taking differently:  Take 10 mg by mouth daily. 04/22/21  Yes Tonny Bollman, MD    Physical Exam:  Constitutional: Elderly male currently in no acute distress Vitals:   05/22/21 0315 05/22/21 0415 05/22/21 0500 05/22/21 0635  BP:  (!) 127/53 112/68 119/71  Pulse: (!) 101 93 81 86  Resp: (!) 24 (!) 26 (!) 21 16  Temp:    98.7 F (37.1 C)  TempSrc:      SpO2: 95% 95% 95% 95%  Weight:  90 kg    Height:  5\' 4"  (1.626 m)     Eyes: PERRL, lids and conjunctivae normal ENMT: Mucous membranes are moist. Posterior pharynx clear of any exudate or lesions.  Neck: normal, supple, no masses, no thyromegaly Respiratory: Mild expiratory wheeze appreciated with intermittent cough on physical exam.  Patient currently maintaining O2 saturations on room air. Cardiovascular: Regular rate and rhythm, no murmurs / rubs / gallops.  +1 pitting lower extremity edema with right leg bigger than the left leg. 2+ pedal pulses. No carotid bruits.  Abdomen: no tenderness, no masses palpated. No hepatosplenomegaly. Bowel sounds positive.  Musculoskeletal: no clubbing / cyanosis. No joint deformity upper and lower extremities. Good ROM, no contractures. Normal muscle tone.  Skin: no rashes, lesions, ulcers. No induration Neurologic: CN 2-12 grossly intact. Sensation intact, DTR normal. Strength 5/5 in all 4.  Psychiatric: Normal judgment and insight. Alert and oriented x 3. Normal mood.     Labs on Admission: I have personally reviewed following labs and imaging studies  CBC: Recent Labs  Lab 05/21/21 1935  WBC 8.7  NEUTROABS 7.4  HGB 14.1  HCT 43.3  MCV 90.4  PLT 245   Basic Metabolic Panel: Recent Labs  Lab 05/21/21 1935  NA 140  K 4.0  CL 103  CO2 26  GLUCOSE 112*  BUN 14  CREATININE 1.23  CALCIUM 9.1   GFR: Estimated Creatinine Clearance: 58.1 mL/min (by C-G formula based on SCr of 1.23 mg/dL). Liver Function Tests: Recent Labs  Lab 05/21/21 1935  AST 24  ALT 21  ALKPHOS 114  BILITOT 0.9  PROT 6.9   ALBUMIN 4.0   No results for input(s): LIPASE, AMYLASE in the last 168 hours. No results for input(s): AMMONIA in the last 168 hours. Coagulation Profile: No results for input(s): INR, PROTIME in the last 168 hours. Cardiac Enzymes: No results for input(s): CKTOTAL, CKMB, CKMBINDEX, TROPONINI in the last 168 hours. BNP (last 3 results) No  results for input(s): PROBNP in the last 8760 hours. HbA1C: No results for input(s): HGBA1C in the last 72 hours. CBG: No results for input(s): GLUCAP in the last 168 hours. Lipid Profile: No results for input(s): CHOL, HDL, LDLCALC, TRIG, CHOLHDL, LDLDIRECT in the last 72 hours. Thyroid Function Tests: No results for input(s): TSH, T4TOTAL, FREET4, T3FREE, THYROIDAB in the last 72 hours. Anemia Panel: No results for input(s): VITAMINB12, FOLATE, FERRITIN, TIBC, IRON, RETICCTPCT in the last 72 hours. Urine analysis:    Component Value Date/Time   COLORURINE YELLOW 05/25/2007 1704   APPEARANCEUR CLEAR 05/25/2007 1704   LABSPEC 1.005 05/25/2007 1704   PHURINE 6.5 05/25/2007 1704   GLUCOSEU NEGATIVE 05/25/2007 1704   HGBUR NEGATIVE 05/25/2007 1704   BILIRUBINUR NEGATIVE 05/25/2007 1704   KETONESUR NEGATIVE 05/25/2007 1704   PROTEINUR NEGATIVE 05/25/2007 1704   UROBILINOGEN 0.2 05/25/2007 1704   NITRITE NEGATIVE 05/25/2007 1704   LEUKOCYTESUR  05/25/2007 1704    NEGATIVE MICROSCOPIC NOT DONE ON URINES WITH NEGATIVE PROTEIN, BLOOD, LEUKOCYTES, NITRITE, OR GLUCOSE <1000 mg/dL.   Sepsis Labs: Recent Results (from the past 240 hour(s))  Resp Panel by RT-PCR (Flu A&B, Covid) Nasopharyngeal Swab     Status: None   Collection Time: 05/21/21  8:30 PM   Specimen: Nasopharyngeal Swab; Nasopharyngeal(NP) swabs in vial transport medium  Result Value Ref Range Status   SARS Coronavirus 2 by RT PCR NEGATIVE NEGATIVE Final    Comment: (NOTE) SARS-CoV-2 target nucleic acids are NOT DETECTED.  The SARS-CoV-2 RNA is generally detectable in upper  respiratory specimens during the acute phase of infection. The lowest concentration of SARS-CoV-2 viral copies this assay can detect is 138 copies/mL. A negative result does not preclude SARS-Cov-2 infection and should not be used as the sole basis for treatment or other patient management decisions. A negative result may occur with  improper specimen collection/handling, submission of specimen other than nasopharyngeal swab, presence of viral mutation(s) within the areas targeted by this assay, and inadequate number of viral copies(<138 copies/mL). A negative result must be combined with clinical observations, patient history, and epidemiological information. The expected result is Negative.  Fact Sheet for Patients:  BloggerCourse.com  Fact Sheet for Healthcare Providers:  SeriousBroker.it  This test is no t yet approved or cleared by the [place] FDA and  has been authorized for detection and/or diagnosis of SARS-CoV-2 by FDA under an Emergency Use Authorization (EUA). This EUA will remain  in effect (meaning this test can be used) for the duration of the COVID-19 declaration under Section 564(b)(1) of the Act, 21 U.S.C.section 360bbb-3(b)(1), unless the authorization is terminated  or revoked sooner.       Influenza A by PCR NEGATIVE NEGATIVE Final   Influenza B by PCR NEGATIVE NEGATIVE Final    Comment: (NOTE) The Xpert Xpress SARS-CoV-2/FLU/RSV plus assay is intended as an aid in the diagnosis of influenza from Nasopharyngeal swab specimens and should not be used as a sole basis for treatment. Nasal washings and aspirates are unacceptable for Xpert Xpress SARS-CoV-2/FLU/RSV testing.  Fact Sheet for Patients: BloggerCourse.com  Fact Sheet for Healthcare Providers: SeriousBroker.it  This test is not yet approved or cleared by the [place] FDA and has been  authorized for detection and/or diagnosis of SARS-CoV-2 by FDA under an Emergency Use Authorization (EUA). This EUA will remain in effect (meaning this test can be used) for the duration of the COVID-19 declaration under Section 564(b)(1) of the Act, 21 U.S.C. section 360bbb-3(b)(1), unless the authorization  is terminated or revoked.  Performed at [facility], [address] [place], [place] [address]      Radiological Exams on Admission: CT Angio Chest PE W and/or Wo Contrast  Result Date: 05/22/2021 CLINICAL DATA:  Shortness of breath for 3 days EXAM: CT ANGIOGRAPHY CHEST WITH CONTRAST TECHNIQUE: Multidetector CT imaging of the chest was performed using the standard protocol during bolus administration of intravenous contrast. Multiplanar CT image reconstructions and MIPs were obtained to evaluate the vascular anatomy. CONTRAST:  100mL OMNIPAQUE IOHEXOL 350 MG/ML SOLN COMPARISON:  03/10/2018 FINDINGS: Cardiovascular: Thoracic aorta and its branches demonstrate atherosclerotic calcifications. No aneurysmal dilatation is seen. Changes consistent with prior coronary bypass grafting are noted. Aortic valve calcifications are noted. No cardiac enlargement is seen. Coronary calcifications are noted. The pulmonary artery shows a normal branching pattern bilaterally. Filling defects are noted within the right lower lobe pulmonary arterial branches consistent with acute pulmonary emboli. Right heart strain is noted within RV/LV ratio of 1.5 although this appears similar to that seen on prior examination. It also appears greater than that expected for the relative small volume of pulmonary emboli. Mediastinum/Nodes: Thoracic inlet is within normal limits. Scattered small mediastinal lymph nodes are noted likely reactive in nature. A few small hilar lymph nodes are noted as well. Esophagus as visualized is within normal limits. Lungs/Pleura: Lungs are well aerated bilaterally. Mild bibasilar  atelectatic changes are seen. No focal confluent infiltrate is noted. No sizable effusion is seen. No parenchymal nodules are noted. Upper Abdomen: No acute abnormality. Musculoskeletal: Degenerative changes of the thoracic spine are seen. No acute rib abnormality is noted. Review of the MIP images confirms the above findings. IMPRESSION: Mild pulmonary emboli within the right lower lobe. Changes of right heart strain as described although they appears similar to that seen on prior exam from 2019 and likely a longstanding abnormality. The degree of heart strain is also greater than that expected for the small volume of pulmonary emboli. Bibasilar atelectatic changes. Aortic Atherosclerosis (ICD10-I70.0). Critical Value/emergent results were called by telephone at the time of interpretation on 05/22/2021 at 3:52 am to Dr. Blinda LeatherwoodPOLLINA, who verbally acknowledged these results. Electronically Signed   By: Alcide CleverMark  Lukens M.D.   On: 05/22/2021 03:53   DG Chest Portable 1 View  Result Date: 05/21/2021 CLINICAL DATA:  Shortness of breath EXAM: PORTABLE CHEST 1 VIEW COMPARISON:  12/06/2019 FINDINGS: Post sternotomy changes. No focal opacity or pleural effusion. Normal cardiomediastinal silhouette with aortic atherosclerosis. No pneumothorax IMPRESSION: No active disease. Electronically Signed   By: Jasmine PangKim  Fujinaga M.D.   On: 05/21/2021 20:28    EKG: Independently reviewed.  Normal sinus rhythm with left axis deviation and RBBB  Assessment/Plan Acute respiratory failure with hypoxia: Patient presented with O2 saturations noted to be as low as 88% on room air.  At baseline he is not on oxygen and is requiring 4 L nasal cannula oxygen maintaining O2 saturations at this time.  Physical exam revealed wheezing.  Chest x-ray was otherwise clear but due to patient's symptoms a CTA of the chest was obtained which revealed signs of right-sided pulmonary embolus.  Symptoms thought to be multifactorial secondary to PE possible asthma  exacerbation -Admit to a progressive bed -Continuous pulse oximetry with nasal cannula oxygen maintain O2 saturation greater than 92%  Pulmonary embolus: Acute.  CTA did note pulmonary embolus with concern for right heart strain, but the degree of right heart strain is greater than expected for the small volume of embolus  noted.  Right lower extremity did appear to be slightly larger than the left given concern for possible residual DVT, but records note current right lower extremity edema is so chronic.  Question cause of symptoms at this time no known malignancy reported in given history of palpitations question arrhythmia as cause versus possibility of the trip taken last month.   -Strict bedrest -Continue heparin drip per pharmacy -Check echocardiogram -Check Doppler ultrasound of the lower extremity -Consider transitioning to p.o. anticoagulation  Asthma exacerbation: Acute.  Patient noted to have wheezing on physical exam with complaints of slightly productive cough.  Patient had been given Solu-Medrol IV in the ED. -Albuterol nebs 4 times daily and as needed -Substituted Brovana and budesonide nebs for Breo inhaler -Prednisone p.o.  Transient hypotension: Acute.  On admission patient blood pressure was noted to be as low as 78/49 after initial IV fluid bolus.  Patient has a history of hypertension at baseline. -Held home blood pressure medications due to initial hypotension  CAD s/p CABG: Patient had CABG in 2008.  Last cardiac cath in 11/2019 revealed severe native vessel coronary artery disease with total occlusion of the LAD at the ostium, moderate distal left main/ostial left circumflex stenosis, total fusion of the first OM branch of the circumflex, and moderate disease of the small nondominant RCA with successful SVG-OM PCI using thrombectomy and stenting.  Home medications include Effient, aspirin, statin. -Hold Effient -Continue aspirin unless cardiology disagrees -Cardiology  consulted in regards to right heart strain and palpitations in setting of PE currently on dual antiplatelet therapy.    Palpitations: Patient reports having intermittent episodes of feeling like his heart is fluttering for the last 4 months or so. -TSH -Follow-up telemetry -Question need of longer cardiac monitoring  Dyslipidemia -Continue atorvastatin and Zetia  Obesity: BMI 34.06 kg/m  DVT prophylaxis: Heparin per pharmacy Code Status: Full Family Communication: Wife updated at bedside Disposition Plan: Home Consults called: Cardiology Admission status: Observation  Clydie Braun MD [facility]   If 7PM-7AM, please contact night-coverage   05/22/2021, 7:00 AM

## 2021-05-22 NOTE — Progress Notes (Signed)
  Echocardiogram 2D Echocardiogram has been performed.  Kevin Guzman 05/22/2021, 9:40 AM

## 2021-05-22 NOTE — Progress Notes (Signed)
ANTICOAGULATION CONSULT NOTE   Pharmacy Consult for heparin Indication: pulmonary embolus  Allergies  Allergen Reactions   Almond Meal Anaphylaxis and Swelling   Almond Oil Anaphylaxis and Swelling   Ace Inhibitors Cough    Patient Measurements: Height: 5\' 4"  (162.6 cm) Weight: 94.3 kg (207 lb 12.8 oz) (scale c) IBW/kg (Calculated) : 59.2 Heparin Dosing Weight: 80kg  Vital Signs: Temp: 98.3 F (36.8 C) (08/10 2006) Temp Source: Oral (08/10 2006) BP: 120/52 (08/10 2006) Pulse Rate: 73 (08/10 2006)  Labs: Recent Labs    05/21/21 1935 05/21/21 2159 05/22/21 1130 05/22/21 1931  HGB 14.1  --   --   --   HCT 43.3  --   --   --   PLT 245  --   --   --   HEPARINUNFRC  --   --  0.94* 0.52  CREATININE 1.23  --   --   --   TROPONINIHS 8 9  --   --      Estimated Creatinine Clearance: 59.5 mL/min (by C-G formula based on SCr of 1.23 mg/dL).   Medical History: Past Medical History:  Diagnosis Date   Acute ST elevation myocardial infarction (STEMI) of inferior wall (HCC) 12/06/2019   Asthma    CAD (coronary artery disease)    a. 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 EF 65% at cath 8/08   Coronary artery disease involving coronary bypass graft of native heart with angina pectoris [facility]) 12/07/2019   12/06/2019: Inferior STEMI-thrombotic occlusion of SVG-OM treated with rheolytic (AngioJet) thrombectomy and 2 site DES PCI.   History of cardiac monitoring    Cardiac Monitor 02/2020: NSR, avg HR 65, no PVCs, no AF   HLD (hyperlipidemia)    HTN (hypertension)    STEMI (ST elevation myocardial infarction) (HCC) 12/06/2019   Testosterone deficiency     Assessment: 70yo male c/o SOB and chest discomfort x3d, CT reveals PE, to begin heparin.  Heparin level is therapeutic at 0.52, no bleeding observed  Goal of Therapy:  Heparin level 0.3-0.7 units/ml Monitor platelets by anticoagulation protocol: Yes   Plan:  Continue heparin gtt at 1150  units/hr F/u 6 hour confirmatory heparin level  70yo, PharmD, [facility] Clinical Pharmacist Please see AMION for all Pharmacists' Contact Phone Numbers 05/22/2021, 8:20 PM

## 2021-05-23 ENCOUNTER — Other Ambulatory Visit (HOSPITAL_COMMUNITY): Payer: Self-pay

## 2021-05-23 DIAGNOSIS — I252 Old myocardial infarction: Secondary | ICD-10-CM | POA: Diagnosis not present

## 2021-05-23 DIAGNOSIS — Z87891 Personal history of nicotine dependence: Secondary | ICD-10-CM | POA: Diagnosis not present

## 2021-05-23 DIAGNOSIS — J4521 Mild intermittent asthma with (acute) exacerbation: Secondary | ICD-10-CM | POA: Diagnosis present

## 2021-05-23 DIAGNOSIS — E785 Hyperlipidemia, unspecified: Secondary | ICD-10-CM | POA: Diagnosis present

## 2021-05-23 DIAGNOSIS — I2510 Atherosclerotic heart disease of native coronary artery without angina pectoris: Secondary | ICD-10-CM | POA: Diagnosis present

## 2021-05-23 DIAGNOSIS — R002 Palpitations: Secondary | ICD-10-CM | POA: Diagnosis present

## 2021-05-23 DIAGNOSIS — Z7951 Long term (current) use of inhaled steroids: Secondary | ICD-10-CM | POA: Diagnosis not present

## 2021-05-23 DIAGNOSIS — E669 Obesity, unspecified: Secondary | ICD-10-CM | POA: Diagnosis present

## 2021-05-23 DIAGNOSIS — I493 Ventricular premature depolarization: Secondary | ICD-10-CM | POA: Diagnosis present

## 2021-05-23 DIAGNOSIS — I4510 Unspecified right bundle-branch block: Secondary | ICD-10-CM | POA: Diagnosis present

## 2021-05-23 DIAGNOSIS — Z888 Allergy status to other drugs, medicaments and biological substances status: Secondary | ICD-10-CM | POA: Diagnosis not present

## 2021-05-23 DIAGNOSIS — Z20822 Contact with and (suspected) exposure to covid-19: Secondary | ICD-10-CM | POA: Diagnosis present

## 2021-05-23 DIAGNOSIS — I2699 Other pulmonary embolism without acute cor pulmonale: Secondary | ICD-10-CM | POA: Diagnosis present

## 2021-05-23 DIAGNOSIS — Z6835 Body mass index (BMI) 35.0-35.9, adult: Secondary | ICD-10-CM | POA: Diagnosis not present

## 2021-05-23 DIAGNOSIS — Z79899 Other long term (current) drug therapy: Secondary | ICD-10-CM | POA: Diagnosis not present

## 2021-05-23 DIAGNOSIS — Z7982 Long term (current) use of aspirin: Secondary | ICD-10-CM | POA: Diagnosis not present

## 2021-05-23 DIAGNOSIS — I25709 Atherosclerosis of coronary artery bypass graft(s), unspecified, with unspecified angina pectoris: Secondary | ICD-10-CM | POA: Diagnosis not present

## 2021-05-23 DIAGNOSIS — J9601 Acute respiratory failure with hypoxia: Secondary | ICD-10-CM | POA: Diagnosis present

## 2021-05-23 DIAGNOSIS — J9811 Atelectasis: Secondary | ICD-10-CM | POA: Diagnosis present

## 2021-05-23 DIAGNOSIS — Z9109 Other allergy status, other than to drugs and biological substances: Secondary | ICD-10-CM | POA: Diagnosis not present

## 2021-05-23 DIAGNOSIS — R61 Generalized hyperhidrosis: Secondary | ICD-10-CM | POA: Diagnosis present

## 2021-05-23 DIAGNOSIS — Z955 Presence of coronary angioplasty implant and graft: Secondary | ICD-10-CM | POA: Diagnosis not present

## 2021-05-23 DIAGNOSIS — Z951 Presence of aortocoronary bypass graft: Secondary | ICD-10-CM | POA: Diagnosis not present

## 2021-05-23 DIAGNOSIS — I959 Hypotension, unspecified: Secondary | ICD-10-CM | POA: Diagnosis present

## 2021-05-23 DIAGNOSIS — I10 Essential (primary) hypertension: Secondary | ICD-10-CM | POA: Diagnosis present

## 2021-05-23 LAB — BASIC METABOLIC PANEL
Anion gap: 7 (ref 5–15)
BUN: 15 mg/dL (ref 8–23)
CO2: 20 mmol/L — ABNORMAL LOW (ref 22–32)
Calcium: 7.8 mg/dL — ABNORMAL LOW (ref 8.9–10.3)
Chloride: 111 mmol/L (ref 98–111)
Creatinine, Ser: 1.08 mg/dL (ref 0.61–1.24)
GFR, Estimated: >60 mL/min (ref >60)
Glucose, Bld: 129 mg/dL — ABNORMAL HIGH (ref 70–99)
Potassium: 4.2 mmol/L (ref 3.5–5.1)
Sodium: 138 mmol/L (ref 135–145)

## 2021-05-23 LAB — CBC
HCT: 36.7 % — ABNORMAL LOW (ref 39.0–52.0)
Hemoglobin: 12.2 g/dL — ABNORMAL LOW (ref 13.0–17.0)
MCH: 29.9 pg (ref 26.0–34.0)
MCHC: 33.2 g/dL (ref 30.0–36.0)
MCV: 90 fL (ref 80.0–100.0)
Platelets: 210 10*3/uL (ref 150–400)
RBC: 4.08 MIL/uL — ABNORMAL LOW (ref 4.22–5.81)
RDW: 13.3 % (ref 11.5–15.5)
WBC: 5.8 10*3/uL (ref 4.0–10.5)
nRBC: 0 % (ref 0.0–0.2)

## 2021-05-23 LAB — HEPARIN LEVEL (UNFRACTIONATED): Heparin Unfractionated: 0.44 IU/mL (ref 0.30–0.70)

## 2021-05-23 MED ORDER — APIXABAN 5 MG PO TABS: 5.0000 mg | ORAL_TABLET | Freq: Two times a day (BID) | ORAL | Status: DC

## 2021-05-23 MED ORDER — ALBUTEROL SULFATE (2.5 MG/3ML) 0.083% IN NEBU
2.5000 mg | INHALATION_SOLUTION | Freq: Three times a day (TID) | RESPIRATORY_TRACT | Status: DC
  Administered 2021-05-23 – 2021-05-24 (×2): 2.5 mg via RESPIRATORY_TRACT
  Filled 2021-05-23 (×5): qty 3

## 2021-05-23 MED ORDER — APIXABAN 5 MG PO TABS: 10.0000 mg | ORAL_TABLET | Freq: Two times a day (BID) | ORAL | Status: DC

## 2021-05-23 MED ORDER — APIXABAN 5 MG PO TABS
10.0000 mg | ORAL_TABLET | Freq: Two times a day (BID) | ORAL | Status: DC
  Administered 2021-05-23 – 2021-05-24 (×2): 10 mg via ORAL
  Filled 2021-05-23 (×2): qty 2

## 2021-05-23 NOTE — Progress Notes (Signed)
ANTICOAGULATION CONSULT NOTE   Pharmacy Consult for heparin Indication: pulmonary embolus 8/10  Allergies  Allergen Reactions   Almond Meal Anaphylaxis and Swelling   Almond Oil Anaphylaxis and Swelling   Ace Inhibitors Cough    Patient Measurements: Height: 5\' 4"  (162.6 cm) Weight: 94.8 kg (208 lb 14.4 oz) (scale c) IBW/kg (Calculated) : 59.2 Heparin Dosing Weight: 80kg  Vital Signs: Temp: 98.3 F (36.8 C) (08/11 0732) Temp Source: Oral (08/11 0018) BP: 151/62 (08/11 0732) Pulse Rate: 57 (08/11 0732)  Labs: Recent Labs    05/21/21 1935 05/21/21 2159 05/22/21 1130 05/22/21 1931 05/23/21 0344  HGB 14.1  --   --   --  12.2*  HCT 43.3  --   --   --  36.7*  PLT 245  --   --   --  210  HEPARINUNFRC  --   --  0.94* 0.52 0.44  CREATININE 1.23  --   --   --  1.08  TROPONINIHS 8 9  --   --   --      Estimated Creatinine Clearance: 68 mL/min (by C-G formula based on SCr of 1.08 mg/dL).   Medical History: Past Medical History:  Diagnosis Date   Acute ST elevation myocardial infarction (STEMI) of inferior wall (HCC) 12/06/2019   Asthma    CAD (coronary artery disease)    a. 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 EF 65% at cath 8/08   Coronary artery disease involving coronary bypass graft of native heart with angina pectoris [facility]) 12/07/2019   12/06/2019: Inferior STEMI-thrombotic occlusion of SVG-OM treated with rheolytic (AngioJet) thrombectomy and 2 site DES PCI.   History of cardiac monitoring    Cardiac Monitor 02/2020: NSR, avg HR 65, no PVCs, no AF   HLD (hyperlipidemia)    HTN (hypertension)    STEMI (ST elevation myocardial infarction) (HCC) 12/06/2019   Testosterone deficiency     Assessment: 71yo M with acute multiple R side Pes, no acute RHS. No anticoagulation prior to admission. Pharmacy consulted for heparin.     Heparin level is therapeutic at 0.44, slightly down trending. H/H, plt slight down on fluids. Will increase  slightly to target upper end of goal given acute PE   Goal of Therapy:  Heparin level 0.3-0.7 units/ml Monitor platelets by anticoagulation protocol: Yes   Plan:  Increase heparin gtt to 1200 units/hr Monitor daily HL, CBC/plt Monitor for signs/symptoms of bleeding  F/u switch to PO   70yo, PharmD, BCPS, [facility] Clinical Pharmacist  Please check AMION for all [facility] phone numbers After 10:00 PM, call Main Pharmacy [phone]

## 2021-05-23 NOTE — Progress Notes (Signed)
PROGRESS NOTE    Subhan Hoopes  WEX:[id number] DOB: 12/27/1950 DOA: 05/21/2021 PCP: Verlon Au, MD    Brief Narrative:  Mr. Lapaglia was admitted to the hospital working diagnosis of acute pulmonary embolism.  70 year old male past medical history for hypertension, dyslipidemia, coronary artery disease and asthma who presented with dyspnea.  Progressive symptoms to the point where he became dyspneic with minimal efforts.  On the day of hospitalization he was diaphoretic, subjective fevers and chills, when EMS arrived his systolic blood pressure was in the 70s and his oxygenation was 88% on room air.  He was placed on supplemental oxygen and transported to the hospital.  At his arrival his heart rate was 76, respiratory rate 29, blood pressure 78/49, oxygen saturation 88%.  His lungs had expiratory wheezing, heart S1-S2, present, rhythmic, soft abdomen, positive lower extremity edema 1+.  Sodium 140, potassium 4.0, chloride 103, bicarb 26 glucose 112, BUN 14, creatinine 1.23, white count 8.7, hemoglobin 14.1, hematocrit 43.3, platelets 245. SARS COVID-19 negative.  Chest radiograph with bibasilar atelectasis.  CT chest with mild pulm emboli within the right lower lobe.  Bibasilar atelectasis. Right heart strain noted within RV/LV ratio of 1.5.(Similar appearing to prior examination).  EKG 89 bpm, left axis deviation, right bundle branch block, sinus rhythm, no significant ST segment or T wave changes.  Assessment & Plan:   Principal Problem:   Acute respiratory failure with hypoxia (HCC) Active Problems:   Dyslipidemia   Essential hypertension   Asthma, intermittent with acute exacerbation   Coronary artery disease involving coronary bypass graft of native heart with angina pectoris (HCC)   Palpitations   Pulmonary embolus (HCC)   Acute right lower lobe pulmonary embolism with acute hypoxemic respiratory failure. Patient with improved dyspnea but not yet back to  baseline. Mid July he did a total of 6 hrs drive in one day. Possible provoked VTE.  Echocardiogram with preserved LV systolic function, 60 to 65% EF, RV with normal systolic function. Mild elevation in pulmonary systolic pressure, up to 43 mmHg.  [place] lower extremities negative for DVT.  Plan to transition from heparin to apixaban.  Continue telemetry monitoring. Discontinue IV fluids and continue close monitoring of blood pressure.   Out of bed to chair tid with meals and ambulate in the hallway, continue oxymetry monitoring.   2. Asthma exacerbation Patient had steroids on admission, at the time of my examination no more wheezing. Continue with bronchodilator therapy and inhaled corticosteroids.   3. CAD and dyslipidemia no chest pain, plan to discontinue prasugrel and continue with aspirin Continue with statin and ezetimibe therapy.   4. Obesity class 2. Calculated BMI is 35,8      Patient continue to be at high risk for worsening PE   Status is: Observation  The patient will require care spanning > 2 midnights and should be moved to inpatient because: IV treatments appropriate due to intensity of illness or inability to take PO and Inpatient level of care appropriate due to severity of illness  Dispo: The patient is from: Home              Anticipated d/c is to: Home              Patient currently is not medically stable to d/c.   Difficult to place patient No   DVT prophylaxis: Apixaban   Code Status:   full  Family Communication:  I spoke with patient's wife at the bedside, we talked in detail about  patient's condition, plan of care and prognosis and all questions were addressed.     Consultants:  Cardiology     Subjective: Patient with improvement in dyspnea but not yet back to baseline, no nausea or vomiting, no chest pain.   Objective: Vitals:   05/23/21 0732 05/23/21 0852 05/23/21 0853 05/23/21 1013  BP: (!) 151/62   (!) 130/59  Pulse: (!) 57   70  Resp:  18   17  Temp: 98.3 F (36.8 C)   98 F (36.7 C)  TempSrc:      SpO2: 95% 94% 94% 90%  Weight:      Height:        Intake/Output Summary (Last 24 hours) at 05/23/2021 1458 Last data filed at 05/23/2021 0839 Gross per 24 hour  Intake 4353.02 ml  Output --  Net 4353.02 ml   Filed Weights   05/22/21 0415 05/22/21 1611 05/23/21 0510  Weight: 90 kg 94.3 kg 94.8 kg    Examination:   General: Not in pain or dyspnea,  Neurology: Awake and alert, non focal  E ENT: no pallor, no icterus, oral mucosa moist Cardiovascular: No JVD. S1-S2 present, rhythmic, no gallops, rubs, or murmurs. Bilateral non pitting lower extremity edema. Pulmonary: positive breath sounds bilaterally with no wheezing, rhonchi or rales. Gastrointestinal. Abdomen protuberant but not tender Skin. No rashes Musculoskeletal: no joint deformities     Data Reviewed: I have personally reviewed following labs and imaging studies  CBC: Recent Labs  Lab 05/21/21 1935 05/23/21 0344  WBC 8.7 5.8  NEUTROABS 7.4  --   HGB 14.1 12.2*  HCT 43.3 36.7*  MCV 90.4 90.0  PLT 245 210   Basic Metabolic Panel: Recent Labs  Lab 05/21/21 1935 05/23/21 0344  NA 140 138  K 4.0 4.2  CL 103 111  CO2 26 20*  GLUCOSE 112* 129*  BUN 14 15  CREATININE 1.23 1.08  CALCIUM 9.1 7.8*   GFR: Estimated Creatinine Clearance: 68 mL/min (by C-G formula based on SCr of 1.08 mg/dL). Liver Function Tests: Recent Labs  Lab 05/21/21 1935  AST 24  ALT 21  ALKPHOS 114  BILITOT 0.9  PROT 6.9  ALBUMIN 4.0   No results for input(s): LIPASE, AMYLASE in the last 168 hours. No results for input(s): AMMONIA in the last 168 hours. Coagulation Profile: No results for input(s): INR, PROTIME in the last 168 hours. Cardiac Enzymes: No results for input(s): CKTOTAL, CKMB, CKMBINDEX, TROPONINI in the last 168 hours. BNP (last 3 results) No results for input(s): PROBNP in the last 8760 hours. HbA1C: No results for input(s): HGBA1C in the  last 72 hours. CBG: No results for input(s): GLUCAP in the last 168 hours. Lipid Profile: No results for input(s): CHOL, HDL, LDLCALC, TRIG, CHOLHDL, LDLDIRECT in the last 72 hours. Thyroid Function Tests: Recent Labs    05/22/21 1931  TSH 0.350   Anemia Panel: No results for input(s): VITAMINB12, FOLATE, FERRITIN, TIBC, IRON, RETICCTPCT in the last 72 hours.    Radiology Studies: I have reviewed all of the imaging during this hospital visit personally     Scheduled Meds:  albuterol  2.5 mg Nebulization TID   allopurinol  300 mg Oral Daily   arformoterol  15 mcg Nebulization BID   aspirin EC  81 mg Oral Daily   atorvastatin  80 mg Oral QPM   budesonide (PULMICORT) nebulizer solution  0.5 mg Nebulization BID   ezetimibe  10 mg Oral Daily   predniSONE  40  mg Oral Q breakfast   sodium chloride flush  3 mL Intravenous Q12H   Continuous Infusions:  sodium chloride 1,000 mL (05/23/21 0656)   heparin 1,200 Units/hr (05/23/21 1002)     LOS: 0 days        Arael Piccione Annett Gula, MD

## 2021-05-23 NOTE — TOC Initial Note (Signed)
ransition of Care [facility]) - Initial/Assessment Note    Patient Details  Name: Kevin Guzman MRN: [id number] Date of Birth: May 05, 1951  Transition of Care [facility]) CM/SW Contact:    Lockie Pares, RN Phone Number: 05/23/2021, 5:34 PM  Clinical Narrative:                  Patient admitted with progressive dyspnea, PE initially under observation, now inpatient.Has Primary Medicare and secondary insurance. Will send for eligibility of DOAC. CM will follow patient for needs and transitions.    Barriers to Discharge: Continued Medical Work up   Patient Goals and CMS Choice        Expected Discharge Plan and Services         Living arrangements for the past 2 months: Single Family Home                                      Prior Living Arrangements/Services Living arrangements for the past 2 months: Single Family Home Lives with:: Spouse Patient language and need for interpreter reviewed:: Yes        Need for Family Participation in Patient Care: Yes (Comment) Care giver support system in place?: Yes (comment)   Criminal Activity/Legal Involvement Pertinent to Current Situation/Hospitalization: No - Comment as needed  Activities of Daily Living Home Assistive Devices/Equipment: None ADL Screening (condition at time of admission) Patient's cognitive ability adequate to safely complete daily activities?: Yes Is the patient deaf or have difficulty hearing?: No Does the patient have difficulty seeing, even when wearing glasses/contacts?: No Does the patient have difficulty concentrating, remembering, or making decisions?: No Patient able to express need for assistance with ADLs?: Yes Does the patient have difficulty dressing or bathing?: No Independently performs ADLs?: Yes (appropriate for developmental age) Does the patient have difficulty walking or climbing stairs?: No Weakness of Legs: None Weakness of Arms/Hands: None  Permission Sought/Granted                   Emotional Assessment   Attitude/Demeanor/Rapport: Gracious Affect (typically observed): Stable Orientation: : Oriented to Self, Oriented to Place, Oriented to  Time, Oriented to Situation   Psych Involvement: No (comment)  Admission diagnosis:  Acute respiratory failure with hypoxia (HCC) [J96.01] Exacerbation of asthma, unspecified asthma severity, unspecified whether persistent [J45.901] Acute pulmonary embolism without acute cor pulmonale, unspecified pulmonary embolism type (HCC) [I26.99] Pulmonary embolism (HCC) [I26.99] Patient Active Problem List   Diagnosis Date Noted   Pulmonary embolism (HCC) 05/23/2021   Palpitations 05/22/2021   Pulmonary embolus (HCC) 05/22/2021   Coronary artery disease involving coronary bypass graft of native heart with angina pectoris (HCC) 12/07/2019   Acute ST elevation myocardial infarction (STEMI) of inferior wall (HCC) 12/06/2019   Cellulitis of right lower extremity    Acute gout    Asthma 07/06/2016   Cellulitis 07/05/2016   Asthma exacerbation 01/13/2016   Acute respiratory failure with hypoxia (HCC) 01/13/2016   Hypokalemia 01/13/2016   Viral URI 01/13/2016   Allergic rhinitis 01/14/2013   Rash and nonspecific skin eruption 03/01/2012   Scabies 01/20/2012   Eczema 01/20/2012   Coronary artery disease involving native coronary artery of native heart with unstable angina pectoris (HCC) 08/03/2009   MASS, CHEST WALL 07/27/2009   Dyslipidemia 07/24/2008   Essential hypertension 07/24/2008   URI 07/24/2008   Asthma, intermittent with acute exacerbation 07/24/2008   PCP:  Virl Son  Howell Rucks, MD Pharmacy:   [facility] [address], [place] - [address] [place] [address] Phone: [phone] Fax: [phone]  [facility] [address] [place] [address] Phone: [phone] Fax: [phone]  [facility] [id number] [place], [place] -  [address] [place] [address] Phone: [phone] Fax: [phone]     Social Determinants of Health (SDOH) Interventions    Readmission Risk Interventions No flowsheet data found.

## 2021-05-23 NOTE — TOC Benefit Eligibility Note (Signed)
Patient Product/process development scientist completed.    The patient is currently admitted and upon discharge could be taking Eliquis 5 mg.  The current 30 day co-pay is, $47.00.   The patient is currently admitted and upon discharge could be taking Xarelto 20 mg.  The current 30 day co-pay is, $47.00.   The patient is insured through [place] Medicare Part D     Roland Earl, CPhT Pharmacy Patient Advocate Specialist [facility] Antimicrobial Stewardship Team Direct Number: [phone]  Fax: [phone]

## 2021-05-23 NOTE — Progress Notes (Signed)
SATURATION QUALIFICATIONS:  Patient Saturations on Room Air at Rest = 95%  Patient Saturations on Room Air while Ambulating = 92%   Patient heart rate with in 84-95bpm

## 2021-05-23 NOTE — Progress Notes (Addendum)
ANTICOAGULATION CONSULT NOTE - Follow Up Consult  Pharmacy Consult for IV Heparin >> Apixaban Indication: pulmonary embolus 8/10  Allergies  Allergen Reactions   Almond Meal Anaphylaxis and Swelling   Almond Oil Anaphylaxis and Swelling   Ace Inhibitors Cough    Patient Measurements: Height: 5\' 4"  (162.6 cm) Weight: 94.8 kg (208 lb 14.4 oz) (scale c) IBW/kg (Calculated) : 59.2 Heparin Dosing Weight:  80 kg  Vital Signs: Temp: 98 F (36.7 C) (08/11 1013) BP: 130/59 (08/11 1013) Pulse Rate: 70 (08/11 1013)  Labs: Recent Labs    05/21/21 1935 05/21/21 2159 05/22/21 1130 05/22/21 1931 05/23/21 0344  HGB 14.1  --   --   --  12.2*  HCT 43.3  --   --   --  36.7*  PLT 245  --   --   --  210  HEPARINUNFRC  --   --  0.94* 0.52 0.44  CREATININE 1.23  --   --   --  1.08  TROPONINIHS 8 9  --   --   --     Estimated Creatinine Clearance: 68 mL/min (by C-G formula based on SCr of 1.08 mg/dL).   Medical History: Past Medical History:  Diagnosis Date   Acute ST elevation myocardial infarction (STEMI) of inferior wall (HCC) 12/06/2019   Asthma    CAD (coronary artery disease)    a. 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 EF 65% at cath 8/08   Coronary artery disease involving coronary bypass graft of native heart with angina pectoris [facility]) 12/07/2019   12/06/2019: Inferior STEMI-thrombotic occlusion of SVG-OM treated with rheolytic (AngioJet) thrombectomy and 2 site DES PCI.   History of cardiac monitoring    Cardiac Monitor 02/2020: NSR, avg HR 65, no PVCs, no AF   HLD (hyperlipidemia)    HTN (hypertension)    STEMI (ST elevation myocardial infarction) (HCC) 12/06/2019   Testosterone deficiency     Assessment: 70 yr old mane with acute multiple R side PE's, no acute RHS. Pharmacy was consulted to dose IV heparin; pt was on no anticoagulants PTA. Heparin infusion has been discontinued, and pharmacy is consulted to dose apixaban for PE.  H/H 12.2/36.7,  plt 210; Scr 1.08, TBW CrCl ~87 ml/min; per RN, no bleeding issues observed.  Goal of Therapy:  Heparin level 0.3-0.7 units/ml Monitor platelets by anticoagulation protocol: Yes   Plan:  Apixaban 10 mg po BID X 7 days, followed by apixaban 5 mg PO BID thereafter (RN will turn off heparin infusion at 1800 tonight and give first dose of apixaban at that time) Monitor daily CBC Monitor for signs/symptoms of bleeding    07-24-1977, PharmD, BCPS, [facility] Clinical Pharmacist

## 2021-05-23 NOTE — Care Management Obs Status (Signed)
MEDICARE OBSERVATION STATUS NOTIFICATION   Patient Details  Name: Kevin Guzman MRN: [id number] Date of Birth: 1951/04/30   Medicare Observation Status Notification Given:  Yes  Permission given to sign for patient  that notice given  Lockie Pares, RN 05/23/2021, 1:23 PM

## 2021-05-24 LAB — CBC
HCT: 37.7 % — ABNORMAL LOW (ref 39.0–52.0)
Hemoglobin: 12.5 g/dL — ABNORMAL LOW (ref 13.0–17.0)
MCH: 30 pg (ref 26.0–34.0)
MCHC: 33.2 g/dL (ref 30.0–36.0)
MCV: 90.4 fL (ref 80.0–100.0)
Platelets: 220 10*3/uL (ref 150–400)
RBC: 4.17 MIL/uL — ABNORMAL LOW (ref 4.22–5.81)
RDW: 13.5 % (ref 11.5–15.5)
WBC: 8.5 10*3/uL (ref 4.0–10.5)
nRBC: 0 % (ref 0.0–0.2)

## 2021-05-24 MED ORDER — APIXABAN 5 MG PO TABS: ORAL_TABLET | ORAL | 0 refills | Status: DC

## 2021-05-24 MED ORDER — APIXABAN 5 MG PO TABS: 5.0000 mg | ORAL_TABLET | Freq: Two times a day (BID) | ORAL | 1 refills | Status: DC

## 2021-05-24 NOTE — Discharge Instructions (Addendum)
Information on my medicine - ELIQUIS (apixaban)  This medication education was reviewed with me or my healthcare representative as part of my discharge preparation.   Why was Eliquis prescribed for you? Eliquis was prescribed to treat blood clots that may have been found in the veins of your legs (deep vein thrombosis) or in your lungs (pulmonary embolism) and to reduce the risk of them occurring again.  What do You need to know about Eliquis ? The starting dose is 10 mg (two 5 mg tablets) taken TWICE daily for the FIRST SEVEN (7) DAYS, then on  04/29/21  the dose is reduced to ONE 5 mg tablet taken TWICE daily.  Eliquis may be taken with or without food.   Try to take the dose about the same time in the morning and in the evening. If you have difficulty swallowing the tablet whole please discuss with your pharmacist how to take the medication safely.  Take Eliquis exactly as prescribed and DO NOT stop taking Eliquis without talking to the doctor who prescribed the medication.  Stopping may increase your risk of developing a new blood clot.  Refill your prescription before you run out.  After discharge, you should have regular check-up appointments with your healthcare provider that is prescribing your Eliquis.    What do you do if you miss a dose? If a dose of ELIQUIS is not taken at the scheduled time, take it as soon as possible on the same day and twice-daily administration should be resumed. The dose should not be doubled to make up for a missed dose.  Important Safety Information A possible side effect of Eliquis is bleeding. You should call your healthcare provider right away if you experience any of the following: Bleeding from an injury or your nose that does not stop. Unusual colored urine (red or dark brown) or unusual colored stools (red or black). Unusual bruising for unknown reasons. A serious fall or if you hit your head (even if there is no bleeding).  Some  medicines may interact with Eliquis and might increase your risk of bleeding or clotting while on Eliquis. To help avoid this, consult your healthcare provider or pharmacist prior to using any new prescription or non-prescription medications, including herbals, vitamins, non-steroidal anti-inflammatory drugs (NSAIDs) and supplements.  This website has more information on Eliquis (apixaban): http://www.eliquis.com/eliquis/home ===================================== Pulmonary Embolism    A pulmonary embolism (PE) is a sudden blockage or decrease of blood flow in one or both lungs. Most blockages come from a blood clot that forms in the vein of a lower leg, thigh, or arm (deep vein thrombosis, DVT) and travels to the lungs. A clot is blood that has thickened into a gel or solid. PE is a dangerous and life-threatening condition that needs to be treated right away.  What are the causes? This condition is usually caused by a blood clot that forms in a vein and moves to the lungs. In rare cases, it may be caused by air, fat, part of a tumor, or other tissue that moves through the veins and into the lungs.  What increases the risk? The following factors may make you more likely to develop this condition: Experiencing a traumatic injury, such as breaking a hip or leg. Having: A spinal cord injury. Orthopedic surgery, especially hip or knee replacement. Any major surgery. A stroke. DVT. Blood clots or blood clotting disease. Long-term (chronic) lung or heart disease. Cancer treated with chemotherapy. A central venous catheter. Taking medicines that  contain estrogen. These include birth control pills and hormone replacement therapy. Being: Pregnant. In the period of time after your baby is delivered (postpartum). Older than age 24. Overweight. A smoker, especially if you have other risks.  What are the signs or symptoms? Symptoms of this condition usually start suddenly and  include: Shortness of breath during activity or at rest. Coughing, coughing up blood, or coughing up blood-tinged mucus. Chest pain that is often worse with deep breaths. Rapid or irregular heartbeat. Feeling light-headed or dizzy. Fainting. Feeling anxious. Fever. Sweating. Pain and swelling in a leg. This is a symptom of DVT, which can lead to PE. How is this diagnosed? This condition may be diagnosed based on: Your medical history. A physical exam. Blood tests. CT pulmonary angiogram. This test checks blood flow in and around your lungs. Ventilation-perfusion scan, also called a lung VQ scan. This test measures air flow and blood flow to the lungs. An ultrasound of the legs.  How is this treated? Treatment for this condition depends on many factors, such as the cause of your PE, your risk for bleeding or developing more clots, and other medical conditions you have. Treatment aims to remove, dissolve, or stop blood clots from forming or growing larger. Treatment may include: Medicines, such as: Blood thinning medicines (anticoagulants) to stop clots from forming. Medicines that dissolve clots (thrombolytics). Procedures, such as: Using a flexible tube to remove a blood clot (embolectomy) or to deliver medicine to destroy it (catheter-directed thrombolysis). Inserting a filter into a large vein that carries blood to the heart (inferior vena cava). This filter (vena cava filter) catches blood clots before they reach the lungs. Surgery to remove the clot (surgical embolectomy). This is rare. You may need a combination of immediate, long-term (up to 3 months after diagnosis), and extended (more than 3 months after diagnosis) treatments. Your treatment may continue for several months (maintenance therapy). You and your health care provider will work together to choose the treatment program that is best for you.  Follow these instructions at home: Medicines Take over-the-counter and  prescription medicines only as told by your health care provider. If you are taking an anticoagulant medicine: Take the medicine every day at the same time each day. Understand what foods and drugs interact with your medicine. Understand the side effects of this medicine, including excessive bruising or bleeding. Ask your health care provider or pharmacist about other side effects.  General instructions Wear a medical alert bracelet or carry a medical alert card that says you have had a PE and lists what medicines you take. Ask your health care provider when you may return to your normal activities. Avoid sitting or lying for a long time without moving. Maintain a healthy weight. Ask your health care provider what weight is healthy for you. Do not use any products that contain nicotine or tobacco, such as cigarettes, e-cigarettes, and chewing tobacco. If you need help quitting, ask your health care provider. Talk with your health care provider about any travel plans. It is important to make sure that you are still able to take your medicine while on trips. Keep all follow-up visits as told by your health care provider. This is important.  Contact a health care provider if: You missed a dose of your blood thinner medicine.  Get help right away if: You have: New or increased pain, swelling, warmth, or redness in an arm or leg. Numbness or tingling in an arm or leg. Shortness of breath during  activity or at rest. A fever. Chest pain. A rapid or irregular heartbeat. A severe headache. Vision changes. A serious fall or accident, or you hit your head. Stomach (abdominal) pain. Blood in your vomit, stool, or urine. A cut that will not stop bleeding. You cough up blood. You feel light-headed or dizzy. You cannot move your arms or legs. You are confused or have memory loss.  These symptoms may represent a serious problem that is an emergency. Do not wait to see if the symptoms will go  away. Get medical help right away. Call your local emergency services (911 in the [place] Do not drive yourself to the hospital. Summary A pulmonary embolism (PE) is a sudden blockage or decrease of blood flow in one or both lungs. PE is a dangerous and life-threatening condition that needs to be treated right away. Treatments for this condition usually include medicines to thin your blood (anticoagulants) or medicines to break apart blood clots (thrombolytics). If you are given blood thinners, it is important to take the medicine every day at the same time each day. Understand what foods and drugs interact with any medicines that you are taking. If you have signs of PE or DVT, call your local emergency services (911 in the [place] This information is not intended to replace advice given to you by your health care provider. Make sure you discuss any questions you have with your health care provider. Document Revised: 07/07/2018 Document Reviewed: 07/07/2018 Elsevier Patient Education  2020 [employer].

## 2021-05-24 NOTE — Plan of Care (Signed)

## 2021-05-24 NOTE — TOC Transition Note (Signed)
Transition of Care [facility]) - CM/SW Discharge Note   Patient Details  Name: Kevin Guzman MRN: [id number] Date of Birth: [birth year]  [facility] of [facility]) CM/SW Contact:  Lockie Pares, RN Phone Number: 05/24/2021, 10:32 AM   Clinical Narrative:    Patient to DC today, aware of eliquis co-pay. No needs identified     Barriers to Discharge: Continued Medical Work up   Patient Goals and CMS Choice        Discharge Placement             Home- self care          Discharge Plan and Services                                     Social Determinants of Health (SDOH) Interventions     Readmission Risk Interventions No flowsheet data found.

## 2021-05-24 NOTE — TOC Benefit Eligibility Note (Signed)
Transition of Care [facility]) Benefit Eligibility Note    Patient Details  Name: Barlow Harrison MRN: [id number] Date of Birth: 10/19/1950   Medication/Dose: Alveda Reasons  10 MG BID  Covered?: Yes  Tier: 3 Drug  Prescription Coverage Preferred Pharmacy: [facility] [address]  Spoke with Person/Company/Phone Number:: DIANA  @ [place] [employer] #  [phone]  Co-Pay: $412.00  Prior Approval: No  Deductible: Met (OUT-OF-POCKET : UNMET)  Additional Notes: ELIQUIS  5 MG BID : COVER- YES  ,  CO-PAY- $42.00 , TIER- 3 DRUG,  P/A-NO    Memory Argue Phone Number: 05/24/2021, 10:38 AM

## 2021-05-24 NOTE — Social Work (Signed)
CSW provided pt with 30 day Eliquis, pt states he is waiting for his TOC meds then will DC. CSW signing off.

## 2021-05-24 NOTE — Discharge Summary (Signed)
Physician Discharge Summary  Kevin Guzman ZOX:[id number] DOB: 21-Apr-1951 DOA: 05/21/2021  PCP: Verlon Au, MD  Admit date: 05/21/2021 Discharge date: 05/24/2021  Admitted From: Home  Disposition:  Home   Recommendations for Outpatient Follow-up and new medication changes:  Follow up with Dr. Leavy Cella in 7 to 10 days.  Patient has been placed on apixaban for anticoagulation  Discontinue prasugrel to decrease risk of bleeding   Home Health: no   Equipment/Devices: no    Discharge Condition: stable CODE STATUS: full  Diet recommendation:  heart healthy   Brief/Interim Summary: [name] was admitted to the hospital working diagnosis of acute pulmonary embolism.   70 year old male past medical history for hypertension, dyslipidemia, coronary artery disease and asthma who presented with dyspnea.  Progressive symptoms to the point where he became dyspneic with minimal efforts.  On the day of hospitalization he was diaphoretic, subjective fevers and chills, when EMS arrived his systolic blood pressure was in the 70s and his oxygenation was 88% on room air.  He was placed on supplemental oxygen and transported to the hospital.  At his arrival his heart rate was 76, respiratory rate 29, blood pressure 78/49, oxygen saturation 88%.  His lungs had expiratory wheezing, heart S1-S2, present, rhythmic, soft abdomen, positive lower extremity edema 1+.   Sodium 140, potassium 4.0, chloride 103, bicarb 26 glucose 112, BUN 14, creatinine 1.23, white count 8.7, hemoglobin 14.1, hematocrit 43.3, platelets 245. SARS COVID-19 negative.   Chest radiograph with bibasilar atelectasis.   CT chest with mild pulm emboli within the right lower lobe.  Bibasilar atelectasis. Right heart strain noted within RV/LV ratio of 1.5.(Similar appearing to prior examination).   EKG 89 bpm, left axis deviation, right bundle branch block, sinus rhythm, no significant ST segment or T wave changes.  Patient was  placed on heparin for anticoagulation, received supplemental 02 per Declo and IV fluids with good toleration.  No RV dysfunction per echocardiogram, blood pressure recovered well. Transition to oral anticoagulation with apixaban and plan to follow up as outpatient.   Acute right lower lobe pulmonary embolism with acute hypoxic respiratory failure and hypotension. Patient was admitted to the telemetry ward, he was placed on full anticoagulation with IV unfractionated heparin, received intravenous fluids and supplemental oxygen per nasal cannula. He regained hemodynamic stability.  Further work-up with echocardiography showed a preserved LV systolic function, 60 to 65%, RV systolic function was normal.  Mild elevation in pulmonary artery systolic pressure up to 43 mmHg. Ultrasonography of the lower extremities negative for deep vein thrombosis.  I suspect patient has underlying chronic pulmonary hypertension, recommend outpatient sleep study and further weight reduction.  Patient has been transitioned to oral anticoagulation with apixaban.  His oxygenation discharge is 98% on room air, blood pressure 138/69.  Mid July he had a 3-hour drive back and forth for total 6 hours within 24 hours that might be the culprit of his venous thromboembolism.  Rising the possibility of provoked deep vein thrombosis, will recommend outpatient follow-up.  2.  Acute asthma exacerbation.  Patient received 1 dose of systemic corticosteroids along with bronchodilator therapy. His symptoms have resolved. I suspect that dyspnea was related to pulmonary embolism and not as much as asthma exacerbation. Continue fluticasone-vilanterol.   3.  Coronary artery disease.  Dyslipidemia. HTN. No chest pain, patient continue taking aspirin, prasugrel has been discontinued to decrease risk of bleeding. Continue statin and ezetimibe. Blood pressure control with bisoprolol.   4.  Obesity class II.  His  calculated BMI 35.8.   Recommend outpatient follow-up.  Lifestyle modifications.  Discharge Diagnoses:  Principal Problem:   Acute respiratory failure with hypoxia (HCC) Active Problems:   Dyslipidemia   Essential hypertension   Asthma, intermittent with acute exacerbation   Coronary artery disease involving coronary bypass graft of native heart with angina pectoris (HCC)   Palpitations   Pulmonary embolus (HCC)   Pulmonary embolism (HCC)    Discharge Instructions   Allergies as of 05/24/2021       Reactions   Almond Meal Anaphylaxis, Swelling   Almond Oil Anaphylaxis, Swelling   Ace Inhibitors Cough        Medication List     STOP taking these medications    prasugrel 10 MG Tabs tablet Commonly known as: EFFIENT       TAKE these medications    albuterol (2.5 MG/3ML) 0.083% nebulizer solution Commonly known as: PROVENTIL Take 2.5 mg by nebulization every 6 (six) hours as needed for wheezing or shortness of breath.   albuterol 108 (90 Base) MCG/ACT inhaler Commonly known as: VENTOLIN HFA Inhale 2 puffs into the lungs every 4 (four) hours as needed for wheezing or shortness of breath.   allopurinol 300 MG tablet Commonly known as: ZYLOPRIM Take 300 mg by mouth daily.   apixaban 5 MG Tabs tablet Commonly known as: ELIQUIS Take 2 tablets twice daily through 05/29/21, on 05/30/21 start taking 1 tablet twice daily.   aspirin 81 MG EC tablet Take 81 mg by mouth daily.   atorvastatin 80 MG tablet Commonly known as: LIPITOR TAKE 1 TABLET (80 MG TOTAL) BY MOUTH DAILY AT 6 PM. What changed:  when to take this additional instructions   betamethasone dipropionate 0.05 % cream Apply 1 application topically 2 (two) times daily as needed (rash on legs).   bisoprolol 5 MG tablet Commonly known as: ZEBETA Take 0.5 tablets (2.5 mg total) by mouth daily.   Breo Ellipta 100-25 MCG/INH Aepb Generic drug: fluticasone furoate-vilanterol Inhale 1 puff into the lungs as needed  (SOB/wheezing).   diphenhydramine-acetaminophen 25-500 MG Tabs tablet Commonly known as: TYLENOL PM Take 1 tablet by mouth at bedtime as needed (sleep).   ezetimibe 10 MG tablet Commonly known as: ZETIA TAKE 1 TABLET BY MOUTH EVERY DAY   hydrOXYzine 10 MG tablet Commonly known as: ATARAX/VISTARIL Take 10-20 mg by mouth at bedtime as needed for anxiety.   Melatonin 10 MG Caps Take 10 mg by mouth at bedtime as needed (for sleep).   nitroGLYCERIN 0.4 MG SL tablet Commonly known as: NITROSTAT Place 1 tablet (0.4 mg total) under the tongue every 5 (five) minutes x 3 doses as needed for chest pain.        Allergies  Allergen Reactions   Almond Meal Anaphylaxis and Swelling   Almond Oil Anaphylaxis and Swelling   Ace Inhibitors Cough    Consultations: Cardiology    Procedures/Studies: CT Angio Chest PE W and/or Wo Contrast  Result Date: 05/22/2021 CLINICAL DATA:  Shortness of breath for 3 days EXAM: CT ANGIOGRAPHY CHEST WITH CONTRAST TECHNIQUE: Multidetector CT imaging of the chest was performed using the standard protocol during bolus administration of intravenous contrast. Multiplanar CT image reconstructions and MIPs were obtained to evaluate the vascular anatomy. CONTRAST:  OMNIPAQUE IOHEXOL 350 MG/ML SOLN COMPARISON:  03/10/2018 FINDINGS: Cardiovascular: Thoracic aorta and its branches demonstrate atherosclerotic calcifications. No aneurysmal dilatation is seen. Changes consistent with prior coronary bypass grafting are noted. Aortic valve calcifications are noted. No cardiac enlargement  is seen. Coronary calcifications are noted. The pulmonary artery shows a normal branching pattern bilaterally. Filling defects are noted within the right lower lobe pulmonary arterial branches consistent with acute pulmonary emboli. Right heart strain is noted within RV/LV ratio of 1.5 although this appears similar to that seen on prior examination. It also appears greater than that  expected for the relative small volume of pulmonary emboli. Mediastinum/Nodes: Thoracic inlet is within normal limits. Scattered small mediastinal lymph nodes are noted likely reactive in nature. A few small hilar lymph nodes are noted as well. Esophagus as visualized is within normal limits. Lungs/Pleura: Lungs are well aerated bilaterally. Mild bibasilar atelectatic changes are seen. No focal confluent infiltrate is noted. No sizable effusion is seen. No parenchymal nodules are noted. Upper Abdomen: No acute abnormality. Musculoskeletal: Degenerative changes of the thoracic spine are seen. No acute rib abnormality is noted. Review of the MIP images confirms the above findings. IMPRESSION: Mild pulmonary emboli within the right lower lobe. Changes of right heart strain as described although they appears similar to that seen on prior exam from 2019 and likely a longstanding abnormality. The degree of heart strain is also greater than that expected for the small volume of pulmonary emboli. Bibasilar atelectatic changes. Aortic Atherosclerosis (ICD10-I70.0). Critical Value/emergent results were called by telephone at the time of interpretation on 05/22/2021 at 3:52 am to Dr. Blinda Leatherwood, who verbally acknowledged these results. Electronically Signed   By: Alcide Clever M.D.   On: 05/22/2021 03:53   DG Chest Portable 1 View  Result Date: 05/21/2021 CLINICAL DATA:  Shortness of breath EXAM: PORTABLE CHEST 1 VIEW COMPARISON:  12/06/2019 FINDINGS: Post sternotomy changes. No focal opacity or pleural effusion. Normal cardiomediastinal silhouette with aortic atherosclerosis. No pneumothorax IMPRESSION: No active disease. Electronically Signed   By: Jasmine Pang M.D.   On: 05/21/2021 20:28   ECHOCARDIOGRAM COMPLETE  Result Date: 05/22/2021    ECHOCARDIOGRAM REPORT   Patient Name:   XAN INGRAHAM Date of Exam: 05/22/2021 Medical Rec #:  [id number]         Height:       64.0 in Accession #:    [id number]        Weight:        198.4 lb Date of Birth:  Feb 19, 1951        BSA:          1.950 m Patient Age:    69 years          BP:           119/71 mmHg Patient Gender: M                 HR:           76 bpm. Exam Location:  Inpatient Procedure: 2D Echo, Cardiac Doppler and Color Doppler Indications:    I26.02 Pulmonary embolus  History:        Patient has prior history of Echocardiogram examinations, most                 recent 12/07/2019. CAD and Previous Myocardial Infarction; Risk                 Factors:Hypertension and Dyslipidemia.  Sonographer:    Tiffany Dance Referring Phys: [id number] RONDELL A SMITH IMPRESSIONS  1. Left ventricular ejection fraction, by estimation, is 60 to 65%. The left ventricle has normal function. The left ventricle has no regional wall motion abnormalities. There is mild asymmetric left ventricular hypertrophy of  the basal-septal segment. Left ventricular diastolic parameters are consistent with Grade I diastolic dysfunction (impaired relaxation).  2. Right ventricular systolic function is low normal. The right ventricular size is normal. There is mildly elevated pulmonary artery systolic pressure. The estimated right ventricular systolic pressure is 43.0 mmHg.  3. The mitral valve is grossly normal. No evidence of mitral valve regurgitation.  4. The aortic valve is tricuspid. Aortic valve regurgitation is not visualized. Mild aortic valve sclerosis is present, with no evidence of aortic valve stenosis.  5. The inferior vena cava is dilated in size with >50% respiratory variability, suggesting right atrial pressure of 8 mmHg. Comparison(s): Changes from prior study are noted. 12/07/2019: LVEF 60-65%. FINDINGS  Left Ventricle: Left ventricular ejection fraction, by estimation, is 60 to 65%. The left ventricle has normal function. The left ventricle has no regional wall motion abnormalities. The left ventricular internal cavity size was normal in size. There is  mild asymmetric left ventricular hypertrophy of the  basal-septal segment. Left ventricular diastolic parameters are consistent with Grade I diastolic dysfunction (impaired relaxation). Indeterminate filling pressures. Right Ventricle: The right ventricular size is normal. No increase in right ventricular wall thickness. Right ventricular systolic function is low normal. There is mildly elevated pulmonary artery systolic pressure. The tricuspid regurgitant velocity is 2.96 m/s, and with an assumed right atrial pressure of 8 mmHg, the estimated right ventricular systolic pressure is 43.0 mmHg. Left Atrium: Left atrial size was normal in size. Right Atrium: Right atrial size was normal in size. Pericardium: There is no evidence of pericardial effusion. Mitral Valve: The mitral valve is grossly normal. No evidence of mitral valve regurgitation. Tricuspid Valve: The tricuspid valve is grossly normal. Tricuspid valve regurgitation is trivial. Aortic Valve: The aortic valve is tricuspid. Aortic valve regurgitation is not visualized. Mild aortic valve sclerosis is present, with no evidence of aortic valve stenosis. Pulmonic Valve: The pulmonic valve was normal in structure. Pulmonic valve regurgitation is not visualized. Aorta: The aortic root and ascending aorta are structurally normal, with no evidence of dilitation. Venous: The inferior vena cava is dilated in size with greater than 50% respiratory variability, suggesting right atrial pressure of 8 mmHg. IAS/Shunts: No atrial level shunt detected by color flow Doppler.  LEFT VENTRICLE PLAX 2D LVIDd:         4.10 cm  Diastology LVIDs:         3.00 cm  LV e' medial:    5.66 cm/s LV PW:         1.00 cm  LV E/e' medial:  16.1 LV IVS:        1.20 cm  LV e' lateral:   8.81 cm/s LVOT diam:     2.10 cm  LV E/e' lateral: 10.4 LV SV:         86 LV SV Index:   44 LVOT Area:     3.46 cm  RIGHT VENTRICLE            IVC RV Basal diam:  4.10 cm    IVC diam: 2.40 cm RV Mid diam:    2.90 cm RV S prime:     9.90 cm/s TAPSE (M-mode): 1.8  cm LEFT ATRIUM             Index       RIGHT ATRIUM           Index LA diam:        3.90 cm 2.00 cm/m  RA Area:  14.90 cm LA Vol (A2C):   46.0 ml 23.59 ml/m RA Volume:   37.00 ml  18.98 ml/m LA Vol (A4C):   38.0 ml 19.49 ml/m LA Biplane Vol: 42.9 ml 22.00 ml/m  AORTIC VALVE LVOT Vmax:   125.00 cm/s LVOT Vmean:  81.750 cm/s LVOT VTI:    0.248 m  AORTA Ao Root diam: 3.50 cm Ao Asc diam:  3.20 cm MITRAL VALVE                TRICUSPID VALVE MV Area (PHT): 3.99 cm     TR Peak grad:   35.0 mmHg MV Decel Time: 190 msec     TR Vmax:        296.00 cm/s MV E velocity: 91.30 cm/s MV A velocity: 109.00 cm/s  SHUNTS MV E/A ratio:  0.84         Systemic VTI:  0.25 m                             Systemic Diam: 2.10 cm Zoila Shutter MD Electronically signed by Zoila Shutter MD Signature Date/Time: 05/22/2021/12:05:37 PM    Final    VAS [place] LOWER EXTREMITY VENOUS (DVT)  Result Date: 05/22/2021  Lower Venous DVT Study Patient Name:  KYLIAN LOH  Date of Exam:   05/22/2021 Medical Rec #: [id number]          Accession #:    [id number] Date of Birth: 02/04/1951         Patient Gender: M Patient Age:   25 years Exam Location:  [facility] Procedure:      VAS [place] LOWER EXTREMITY VENOUS (DVT) Referring Phys: Madelyn Flavors --------------------------------------------------------------------------------  Indications: Pulmonary embolism.  Comparison Study: No prior study Performing Technologist: Gertie Fey MHA, RDMS, RVT, RDCS  Examination Guidelines: A complete evaluation includes B-mode imaging, spectral Doppler, color Doppler, and power Doppler as needed of all accessible portions of each vessel. Bilateral testing is considered an integral part of a complete examination. Limited examinations for reoccurring indications may be performed as noted. The reflux portion of the exam is performed with the patient in reverse Trendelenburg.  +---------+---------------+---------+-----------+----------+--------------+  RIGHT    CompressibilityPhasicitySpontaneityPropertiesThrombus Aging +---------+---------------+---------+-----------+----------+--------------+ CFV      Full           Yes      Yes                                 +---------+---------------+---------+-----------+----------+--------------+ SFJ      Full                                                        +---------+---------------+---------+-----------+----------+--------------+ FV Prox  Full                                                        +---------+---------------+---------+-----------+----------+--------------+ FV Mid   Full                                                        +---------+---------------+---------+-----------+----------+--------------+  FV DistalFull                                                        +---------+---------------+---------+-----------+----------+--------------+ PFV      Full                                                        +---------+---------------+---------+-----------+----------+--------------+ POP      Full           Yes      Yes                                 +---------+---------------+---------+-----------+----------+--------------+ PTV      Full                                                        +---------+---------------+---------+-----------+----------+--------------+ PERO     Full                                                        +---------+---------------+---------+-----------+----------+--------------+   +---------+---------------+---------+-----------+----------+--------------+ LEFT     CompressibilityPhasicitySpontaneityPropertiesThrombus Aging +---------+---------------+---------+-----------+----------+--------------+ CFV      Full           Yes      Yes                                 +---------+---------------+---------+-----------+----------+--------------+ SFJ      Full                                                         +---------+---------------+---------+-----------+----------+--------------+ FV Prox  Full                                                        +---------+---------------+---------+-----------+----------+--------------+ FV Mid   Full                                                        +---------+---------------+---------+-----------+----------+--------------+ FV DistalFull                                                        +---------+---------------+---------+-----------+----------+--------------+  PFV      Full                                                        +---------+---------------+---------+-----------+----------+--------------+ POP      Full           Yes      Yes                                 +---------+---------------+---------+-----------+----------+--------------+ PTV      Full                                                        +---------+---------------+---------+-----------+----------+--------------+ PERO     Full                                                        +---------+---------------+---------+-----------+----------+--------------+     Summary: RIGHT: - There is no evidence of deep vein thrombosis in the lower extremity.  - No cystic structure found in the popliteal fossa.  LEFT: - There is no evidence of deep vein thrombosis in the lower extremity.  - No cystic structure found in the popliteal fossa.  *See table(s) above for measurements and observations. Electronically signed by Coral Else MD on 05/22/2021 at 6:54:48 PM.    Final        Subjective: Patient is feeling better, dyspnea continue to improve, no chest pain, no nausea or vomiting, no bleeding.   Discharge Exam: Vitals:   05/24/21 0413 05/24/21 0831  BP: 138/69   Pulse: (!) 55   Resp: 18   Temp: 97.7 F (36.5 C)   SpO2: 96% 98%   Vitals:   05/23/21 2005 05/23/21 2031 05/24/21 0413 05/24/21 0831  BP:  139/60 138/69    Pulse:  62 (!) 55   Resp:  17 18   Temp:  97.6 F (36.4 C) 97.7 F (36.5 C)   TempSrc:  Oral Oral   SpO2: 95% 96% 96% 98%  Weight:   94.4 kg   Height:        General: Not in pain or dyspnea  Neurology: Awake and alert, non focal  E ENT: no pallor, no icterus, oral mucosa moist Cardiovascular: No JVD. S1-S2 present, rhythmic, no gallops, rubs, or murmurs. Trace non pitting lower extremity edema. Pulmonary: positive breath sounds bilaterally, adequate air movement, no wheezing, rhonchi or rales. Gastrointestinal. Abdomen soft and non tender Skin. No rashes Musculoskeletal: no joint deformities   The results of significant diagnostics from this hospitalization (including imaging, microbiology, ancillary and laboratory) are listed below for reference.     Microbiology: Recent Results (from the past 240 hour(s))  Resp Panel by RT-PCR (Flu A&B, Covid) Nasopharyngeal Swab     Status: None   Collection Time: 05/21/21  8:30 PM   Specimen: Nasopharyngeal Swab; Nasopharyngeal(NP) swabs in vial transport medium  Result Value Ref Range Status   SARS Coronavirus 2  by RT PCR NEGATIVE NEGATIVE Final    Comment: (NOTE) SARS-CoV-2 target nucleic acids are NOT DETECTED.  The SARS-CoV-2 RNA is generally detectable in upper respiratory specimens during the acute phase of infection. The lowest concentration of SARS-CoV-2 viral copies this assay can detect is 138 copies/mL. A negative result does not preclude SARS-Cov-2 infection and should not be used as the sole basis for treatment or other patient management decisions. A negative result may occur with  improper specimen collection/handling, submission of specimen other than nasopharyngeal swab, presence of viral mutation(s) within the areas targeted by this assay, and inadequate number of viral copies(<138 copies/mL). A negative result must be combined with clinical observations, patient history, and epidemiological information. The  expected result is Negative.  Fact Sheet for Patients:  BloggerCourse.com  Fact Sheet for Healthcare Providers:  SeriousBroker.it  This test is no t yet approved or cleared by the [place] FDA and  has been authorized for detection and/or diagnosis of SARS-CoV-2 by FDA under an Emergency Use Authorization (EUA). This EUA will remain  in effect (meaning this test can be used) for the duration of the COVID-19 declaration under Section 564(b)(1) of the Act, 21 U.S.C.section 360bbb-3(b)(1), unless the authorization is terminated  or revoked sooner.       Influenza A by PCR NEGATIVE NEGATIVE Final   Influenza B by PCR NEGATIVE NEGATIVE Final    Comment: (NOTE) The Xpert Xpress SARS-CoV-2/FLU/RSV plus assay is intended as an aid in the diagnosis of influenza from Nasopharyngeal swab specimens and should not be used as a sole basis for treatment. Nasal washings and aspirates are unacceptable for Xpert Xpress SARS-CoV-2/FLU/RSV testing.  Fact Sheet for Patients: BloggerCourse.com  Fact Sheet for Healthcare Providers: SeriousBroker.it  This test is not yet approved or cleared by the [place] FDA and has been authorized for detection and/or diagnosis of SARS-CoV-2 by FDA under an Emergency Use Authorization (EUA). This EUA will remain in effect (meaning this test can be used) for the duration of the COVID-19 declaration under Section 564(b)(1) of the Act, 21 U.S.C. section 360bbb-3(b)(1), unless the authorization is terminated or revoked.  Performed at [facility], [address] [place], [place] [address]      Labs: BNP (last 3 results) Recent Labs    05/21/21 1935  BNP 91.5   Basic Metabolic Panel: Recent Labs  Lab 05/21/21 1935 05/23/21 0344  NA 140 138  K 4.0 4.2  CL 103 111  CO2 26 20*  GLUCOSE 112* 129*  BUN 14 15  CREATININE 1.23 1.08   CALCIUM 9.1 7.8*   Liver Function Tests: Recent Labs  Lab 05/21/21 1935  AST 24  ALT 21  ALKPHOS 114  BILITOT 0.9  PROT 6.9  ALBUMIN 4.0   No results for input(s): LIPASE, AMYLASE in the last 168 hours. No results for input(s): AMMONIA in the last 168 hours. CBC: Recent Labs  Lab 05/21/21 1935 05/23/21 0344 05/24/21 0202  WBC 8.7 5.8 8.5  NEUTROABS 7.4  --   --   HGB 14.1 12.2* 12.5*  HCT 43.3 36.7* 37.7*  MCV 90.4 90.0 90.4  PLT 245 210 220   Cardiac Enzymes: No results for input(s): CKTOTAL, CKMB, CKMBINDEX, TROPONINI in the last 168 hours. BNP: Invalid input(s): POCBNP CBG: No results for input(s): GLUCAP in the last 168 hours. D-Dimer Recent Labs    05/22/21 0206  DDIMER 0.38   Hgb A1c No results for input(s): HGBA1C in the last 72 hours. Lipid  Profile No results for input(s): CHOL, HDL, LDLCALC, TRIG, CHOLHDL, LDLDIRECT in the last 72 hours. Thyroid function studies Recent Labs    05/22/21 1931  TSH 0.350   Anemia work up No results for input(s): VITAMINB12, FOLATE, FERRITIN, TIBC, IRON, RETICCTPCT in the last 72 hours. Urinalysis    Component Value Date/Time   COLORURINE YELLOW 05/25/2007 1704   APPEARANCEUR CLEAR 05/25/2007 1704   LABSPEC 1.005 05/25/2007 1704   PHURINE 6.5 05/25/2007 1704   GLUCOSEU NEGATIVE 05/25/2007 1704   HGBUR NEGATIVE 05/25/2007 1704   BILIRUBINUR NEGATIVE 05/25/2007 1704   KETONESUR NEGATIVE 05/25/2007 1704   PROTEINUR NEGATIVE 05/25/2007 1704   UROBILINOGEN 0.2 05/25/2007 1704   NITRITE NEGATIVE 05/25/2007 1704   LEUKOCYTESUR  05/25/2007 1704    NEGATIVE MICROSCOPIC NOT DONE ON URINES WITH NEGATIVE PROTEIN, BLOOD, LEUKOCYTES, NITRITE, OR GLUCOSE <1000 mg/dL.   Sepsis Labs Invalid input(s): PROCALCITONIN,  WBC,  LACTICIDVEN Microbiology Recent Results (from the past 240 hour(s))  Resp Panel by RT-PCR (Flu A&B, Covid) Nasopharyngeal Swab     Status: None   Collection Time: 05/21/21  8:30 PM   Specimen:  Nasopharyngeal Swab; Nasopharyngeal(NP) swabs in vial transport medium  Result Value Ref Range Status   SARS Coronavirus 2 by RT PCR NEGATIVE NEGATIVE Final    Comment: (NOTE) SARS-CoV-2 target nucleic acids are NOT DETECTED.  The SARS-CoV-2 RNA is generally detectable in upper respiratory specimens during the acute phase of infection. The lowest concentration of SARS-CoV-2 viral copies this assay can detect is 138 copies/mL. A negative result does not preclude SARS-Cov-2 infection and should not be used as the sole basis for treatment or other patient management decisions. A negative result may occur with  improper specimen collection/handling, submission of specimen other than nasopharyngeal swab, presence of viral mutation(s) within the areas targeted by this assay, and inadequate number of viral copies(<138 copies/mL). A negative result must be combined with clinical observations, patient history, and epidemiological information. The expected result is Negative.  Fact Sheet for Patients:  BloggerCourse.com  Fact Sheet for Healthcare Providers:  SeriousBroker.it  This test is no t yet approved or cleared by the [place] FDA and  has been authorized for detection and/or diagnosis of SARS-CoV-2 by FDA under an Emergency Use Authorization (EUA). This EUA will remain  in effect (meaning this test can be used) for the duration of the COVID-19 declaration under Section 564(b)(1) of the Act, 21 U.S.C.section 360bbb-3(b)(1), unless the authorization is terminated  or revoked sooner.       Influenza A by PCR NEGATIVE NEGATIVE Final   Influenza B by PCR NEGATIVE NEGATIVE Final    Comment: (NOTE) The Xpert Xpress SARS-CoV-2/FLU/RSV plus assay is intended as an aid in the diagnosis of influenza from Nasopharyngeal swab specimens and should not be used as a sole basis for treatment. Nasal washings and aspirates are unacceptable for  Xpert Xpress SARS-CoV-2/FLU/RSV testing.  Fact Sheet for Patients: BloggerCourse.com  Fact Sheet for Healthcare Providers: SeriousBroker.it  This test is not yet approved or cleared by the [place] FDA and has been authorized for detection and/or diagnosis of SARS-CoV-2 by FDA under an Emergency Use Authorization (EUA). This EUA will remain in effect (meaning this test can be used) for the duration of the COVID-19 declaration under Section 564(b)(1) of the Act, 21 U.S.C. section 360bbb-3(b)(1), unless the authorization is terminated or revoked.  Performed at [facility], [address], [place], [place] [address]      Time coordinating discharge:  45 minutes  SIGNED:   Coralie Keens, MD  [facility] 05/24/2021, 8:54 AM

## 2021-05-24 NOTE — Progress Notes (Signed)
Patient given discharge instructions ans stated understanding.

## 2021-07-24 ENCOUNTER — Other Ambulatory Visit: Payer: Self-pay

## 2021-07-24 ENCOUNTER — Encounter (INDEPENDENT_AMBULATORY_CARE_PROVIDER_SITE_OTHER): Payer: Self-pay

## 2021-07-24 ENCOUNTER — Other Ambulatory Visit: Payer: Medicare Other | Admitting: *Deleted

## 2021-07-24 DIAGNOSIS — I493 Ventricular premature depolarization: Secondary | ICD-10-CM

## 2021-07-24 DIAGNOSIS — E782 Mixed hyperlipidemia: Secondary | ICD-10-CM

## 2021-07-24 DIAGNOSIS — I10 Essential (primary) hypertension: Secondary | ICD-10-CM

## 2021-07-24 DIAGNOSIS — I25810 Atherosclerosis of coronary artery bypass graft(s) without angina pectoris: Secondary | ICD-10-CM

## 2021-07-25 LAB — LIPID PANEL
Chol/HDL Ratio: 3 ratio (ref 0.0–5.0)
Cholesterol, Total: 153 mg/dL (ref 100–199)
HDL: 51 mg/dL (ref >39)
LDL Chol Calc (NIH): 83 mg/dL (ref 0–99)
Triglycerides: 102 mg/dL (ref 0–149)
VLDL Cholesterol Cal: 19 mg/dL (ref 5–40)

## 2021-07-29 NOTE — Progress Notes (Signed)
Cardiology Office Note:    Date:  07/30/2021   ID:  Kevin Guzman, DOB 1950-12-18, MRN [id number]  PCP:  Kevin Au, MD   [facility] Providers Cardiologist:  Kevin Bollman, MD    Referring MD: Kevin Au, MD   Chief Complaint:  f/u for CAD    Patient Profile:   Kevin Guzman is a 70 y.o. male with:  Coronary artery disease  S/p NSTEMI in 2008 >> CABG S/p inf STEMI 11/2019>>PCI:  DES x 2 to S-OM Hypertension  Hyperlipidemia  Hx of pulmonary embolism (05/2021) Aortic atherosclerosis  Right Bundle Branch Block   Prior CV studies: Chest CTA 05/22/21 IMPRESSION: Mild pulmonary emboli within the right lower lobe.   Changes of right heart strain as described although they appears similar to that seen on prior exam from 2019 and likely a longstanding abnormality. The degree of heart strain is also greater than that expected for the small volume of pulmonary emboli.   Bibasilar atelectatic changes.   Aortic Atherosclerosis (ICD10-I70.0).  Echocardiogram 05/22/21 EF 60-65, no RWMA, mild asymmetric LVH, G1 DD, low normal RVSF, mildly elevated PASP, RVSP 43, mild AV sclerosis  LONG TERM MONITOR (3-7 DAYS) INTERPRETATION 02/15/2020 Narrative 1. The basic rhythm is normal sinus with an average HR of 65 bpm 2. No atrial fibrillation or flutter 3. No high-grade heart block or pathologic pauses 4. There are no PVC's and rare supraventricular beats without sustained arrhythmias Benign monitor  Echocardiogram 12/07/2019 EF 60-65, Gr 1 DD, normal RVSF, trivial MR   Cardiac catheterization 12/06/2019 LM mid 50 LAD ost 100 OM1 100 RCA prox 50 L-LAD ok S-D1 ok S-OM 80, mid 100 EF 55-65 PCI: 3.5 x 26 mm Resolute Onyx DES, 4 x 26 mm Resolute Onyx DES to S-OM   Myoview 02/24/2019 EF 63, Low Risk    History of Present Illness: Kevin Guzman was last seen in 07/2020 by Kevin Reedy, PA-C.   He was admitted in 8/22 with an acute pulmonary embolism.   CT showed evidence of R heart strain.  Echocardiogram showed normal EF, normal RVF and mild pulmonary hypertension.  He was seen by Dr. Clifton Guzman for Cardiology consultation regarding his antiplatelet Rx.  His Effient was [facility] and he was placed on anticoagulation with Apixaban.  He returns for f/u.  He is here alone.  He continues to have issues with shortness of breath.  This seems to be worse when he is at home.  He suspects he may have issues with allergies related to his pets.  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  He has tightness related to his asthma but has not had any chest pain.  He has a lot of wheezing as well as a productive cough.  He has not had any fevers.  His cough is fairly chronic.  He has not had any recurrent anginal symptoms.  He has not had syncope.  He sleeps on 1 pillow.  He has chronic right lower extremity edema.  His right leg has been larger than his left for years since a prior infection.    Past Medical History:  Diagnosis Date   Acute ST elevation myocardial infarction (STEMI) of inferior wall (HCC) 12/06/2019   Asthma    CAD (coronary artery disease)    a. 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 EF 65% at cath 8/08   Coronary artery disease involving coronary bypass graft  of native heart with angina pectoris [facility]) 12/07/2019   12/06/2019: Inferior STEMI-thrombotic occlusion of SVG-OM treated with rheolytic (AngioJet) thrombectomy and 2 site DES PCI.   History of cardiac monitoring    Cardiac Monitor 02/2020: NSR, avg HR 65, no PVCs, no AF   HLD (hyperlipidemia)    HTN (hypertension)    STEMI (ST elevation myocardial infarction) (HCC) 12/06/2019   Testosterone deficiency    Current Medications: Current Meds  Medication Sig   albuterol (PROVENTIL HFA;VENTOLIN HFA) 108 (90 Base) MCG/ACT inhaler Inhale 2 puffs into the lungs every 4 (four) hours as needed for wheezing or shortness of breath.    albuterol  (PROVENTIL) (2.5 MG/3ML) 0.083% nebulizer solution Take 2.5 mg by nebulization every 6 (six) hours as needed for wheezing or shortness of breath.   allopurinol (ZYLOPRIM) 300 MG tablet Take 300 mg by mouth daily.   apixaban (ELIQUIS) 5 MG TABS tablet Take 1 tablet (5 mg total) by mouth 2 (two) times daily.   aspirin 81 MG EC tablet Take 81 mg by mouth daily.     betamethasone dipropionate 0.05 % cream Apply 1 application topically 2 (two) times daily as needed (rash on legs).   bisoprolol (ZEBETA) 5 MG tablet Take 0.5 tablets (2.5 mg total) by mouth daily.   diphenhydramine-acetaminophen (TYLENOL PM) 25-500 MG TABS tablet Take 1 tablet by mouth at bedtime as needed (sleep).   ezetimibe (ZETIA) 10 MG tablet TAKE 1 TABLET BY MOUTH EVERY DAY   fluticasone furoate-vilanterol (BREO ELLIPTA) 100-25 MCG/INH AEPB Inhale 1 puff into the lungs as needed (SOB/wheezing).    hydrOXYzine (ATARAX/VISTARIL) 10 MG tablet Take 10-20 mg by mouth at bedtime as needed for anxiety.   Melatonin 10 MG CAPS Take 10 mg by mouth at bedtime as needed (for sleep).   nitroGLYCERIN (NITROSTAT) 0.4 MG SL tablet Place 1 tablet (0.4 mg total) under the tongue every 5 (five) minutes x 3 doses as needed for chest pain.   rosuvastatin (CRESTOR) 40 MG tablet Take 1 tablet (40 mg total) by mouth daily.   [DISCONTINUED] apixaban (ELIQUIS) 5 MG TABS tablet Take 2 tablets twice daily through 05/29/21, on 05/30/21 start taking 1 tablet twice daily.   [DISCONTINUED] atorvastatin (LIPITOR) 80 MG tablet TAKE 1 TABLET (80 MG TOTAL) BY MOUTH DAILY AT 6 PM.    Allergies:   Almond meal, Almond oil, and Ace inhibitors   Social History   Tobacco Use   Smoking status: Former   Smokeless tobacco: Former    Quit date: 10/13/1998  Vaping Use   Vaping Use: Never used  Substance Use Topics   Alcohol use: No   Drug use: No    Family Hx: The patient's family history includes Cancer in his mother. There is no history of Heart attack or  Stroke.  Review of Systems  Gastrointestinal:  Negative for hematochezia.  Genitourinary:  Negative for hematuria.    EKGs/Labs/Other Test Reviewed:    EKG:  EKG is not ordered today.  The ekg ordered today demonstrates n/a EKG from 05/21/2021 was personally reviewed and demonstrated normal sinus rhythm, heart rate 89, left axis deviation, right bundle branch block  Recent Labs: 05/21/2021: ALT 21; B Natriuretic Peptide 91.5 05/22/2021: TSH 0.350 05/23/2021: BUN 15; Creatinine, Ser 1.08; Potassium 4.2; Sodium 138 05/24/2021: Hemoglobin 12.5; Platelets 220   Recent Lipid Panel Lab Results  Component Value Date/Time   CHOL 153 07/24/2021 12:58 PM   TRIG 102 07/24/2021 12:58 PM   TRIG 199 (H) 08/18/2006 03:07  PM   HDL 51 07/24/2021 12:58 PM   LDLCALC 83 07/24/2021 12:58 PM   LDLDIRECT 132.0 06/08/2015 05:32 PM     Risk Assessment/Calculations:          Physical Exam:    VS:  BP 120/80   Pulse 66   Ht 5\' 4"  (1.626 m)   Wt 203 lb 3.2 oz (92.2 kg)   SpO2 94%   BMI 34.88 kg/m     Wt Readings from Last 3 Encounters:  07/30/21 203 lb 3.2 oz (92.2 kg)  05/24/21 208 lb 1.6 oz (94.4 kg)  08/01/20 201 lb 9.6 oz (91.4 kg)    Constitutional:      Appearance: Healthy appearance. Not in distress.  Neck:     Vascular: JVD normal.  Pulmonary:     Effort: Pulmonary effort is normal.     Breath sounds: Diffuse Wheezing (inf and exp) present. No rales.  Cardiovascular:     Normal rate. Regular rhythm. Normal S1. Normal S2.      Murmurs: There is no murmur.  Edema:    Peripheral edema present.    Ankle: trace edema of the right ankle. Abdominal:     Palpations: Abdomen is soft.  Skin:    General: Skin is warm and dry.  Neurological:     General: No focal deficit present.     Mental Status: Alert and oriented to person, place and time.     Cranial Nerves: Cranial nerves are intact.        ASSESSMENT & PLAN:   1. Coronary artery disease involving coronary bypass graft of  native heart without angina pectoris History of non-STEMI in 2008 followed by CABG.  He had an inferior STEMI in February 2021 treated with a DES x2 to the SVG-OM.  At cardiac catheterization last year, his LIMA-LAD and vein graft to the diagonal were both patent.  He is now off of prasugrel as he is on Apixaban for management of his pulmonary embolism.  He has a lot of shortness of breath related to asthma but is not having anginal symptoms.  Continue aspirin 81 mg daily, statin therapy, ezetimibe 10 mg daily, bisoprolol 2.5 mg daily.  Follow-up in 6 months.  2. Essential hypertension Blood pressure is well controlled.  Continue bisoprolol 2.5 mg daily.  3. Mixed hyperlipidemia Recent LDL above goal.  Discontinue atorvastatin.  Start rosuvastatin 40 mg daily.  Continue ezetimibe 10 mg daily.  Obtain fasting lipids, LFTs in 3 months.  If his LDL remains above 70, consider referral to lipid clinic for consideration of PCSK9 inhibitor.  4. History of pulmonary embolus (PE) 5. Uncomplicated asthma, unspecified asthma severity, unspecified whether persistent Echocardiogram the hospital demonstrated just mild RV dysfunction and mildly elevated pulmonary pressures.  He is on anticoagulation now with Apixaban.  This is managed by primary care.  He has a lot of wheezing on exam and I suspect his shortness of breath is all related to uncontrolled asthma.  I have recommended that he follow-up with pulmonology for further management of his asthma.   Dispo:  Return in about 6 months (around 01/28/2022) for Routine Follow Up, w/ Dr. 01/30/2022.   Medication Adjustments/Labs and Tests Ordered: Current medicines are reviewed at length with the patient today.  Concerns regarding medicines are outlined above.  Tests Ordered: Orders Placed This Encounter  Procedures   Hepatic function panel   Lipid Profile   Medication Changes: Meds ordered this encounter  Medications   rosuvastatin (CRESTOR) 40  MG tablet     Sig: Take 1 tablet (40 mg total) by mouth daily.    Dispense:  90 tablet    Refill:  3   Signed, Tereso Newcomer, PA-C  07/30/2021 2:26 PM    [facility] [address] [place], [place], [place]  [address] Phone: [phone]; Fax: [phone]

## 2021-07-30 ENCOUNTER — Other Ambulatory Visit: Payer: Self-pay

## 2021-07-30 ENCOUNTER — Encounter: Payer: Self-pay | Admitting: Physician Assistant

## 2021-07-30 ENCOUNTER — Ambulatory Visit (INDEPENDENT_AMBULATORY_CARE_PROVIDER_SITE_OTHER): Payer: Medicare Other | Admitting: Physician Assistant

## 2021-07-30 VITALS — BP 120/80 | HR 66 | Ht 64.0 in | Wt 203.2 lb

## 2021-07-30 DIAGNOSIS — E782 Mixed hyperlipidemia: Secondary | ICD-10-CM

## 2021-07-30 DIAGNOSIS — I25810 Atherosclerosis of coronary artery bypass graft(s) without angina pectoris: Secondary | ICD-10-CM

## 2021-07-30 DIAGNOSIS — Z86711 Personal history of pulmonary embolism: Secondary | ICD-10-CM | POA: Diagnosis not present

## 2021-07-30 DIAGNOSIS — I10 Essential (primary) hypertension: Secondary | ICD-10-CM

## 2021-07-30 DIAGNOSIS — J45909 Unspecified asthma, uncomplicated: Secondary | ICD-10-CM

## 2021-07-30 MED ORDER — ROSUVASTATIN CALCIUM 40 MG PO TABS: 40.0000 mg | ORAL_TABLET | Freq: Every day | ORAL | 3 refills | Status: DC

## 2021-07-30 NOTE — Patient Instructions (Addendum)
Medication Instructions:   DISCONTINUE Atorvastatin  START Crestor one tablet by mouth ( 40 mg) daily.   *If you need a refill on your cardiac medications before your next appointment, please call your pharmacy*   Lab Work:Your physician recommends that you return for a FASTING lipid profile/lft on Monday, January 9. You can come in on the day of appointment between 7:30-4:30 fasting from midnight the night before.   If you have labs (blood work) drawn today and your tests are completely normal, you will receive your results only by: MyChart Message (if you have MyChart) OR A paper copy in the mail If you have any lab test that is abnormal or we need to change your treatment, we will call you to review the results.   Testing/Procedures:  -NONE-   Follow-Up: At [facility], you and your health needs are our priority.  As part of our continuing mission to provide you with exceptional heart care, we have created designated Provider Care Teams.  These Care Teams include your primary Cardiologist (physician) and Advanced Practice Providers (APPs -  Physician Assistants and Nurse Practitioners) who all work together to provide you with the care you need, when you need it.  We recommend signing up for the patient portal called "MyChart".  Sign up information is provided on this After Visit Summary.  MyChart is used to connect with patients for Virtual Visits (Telemedicine).  Patients are able to view lab/test results, encounter notes, upcoming appointments, etc.  Non-urgent messages can be sent to your provider as well.   To learn more about what you can do with MyChart, go to [email or web address].    Your next appointment:   6 month(s)  The format for your next appointment:   In Person  Provider:   Tonny Bollman, MD   Other Instructions Your physician wants you to follow-up in: 6 month with Dr. Excell Seltzer.  You will receive a reminder letter in the mail two months in advance. If  you don't receive a letter, please call our office to schedule the follow-up appointment.   Please keep your appointment on Thursday, October 20 @ 2:45. Come 15 minutes early with insurance card, list of medication, co-pay, mask on [employer]. [phone].

## 2021-09-18 IMAGING — CT CT ANGIO CHEST
2 of 7 series · 17 of 46 positions shown · IV contrast (APPLIED)
Comparison: 03/10/2018

CLINICAL DATA: Shortness of breath for 3 days

EXAM:
CT ANGIOGRAPHY CHEST WITH CONTRAST
TECHNIQUE: Multidetector CT imaging of the chest was performed using the
standard protocol during bolus administration of intravenous
contrast. Multiplanar CT image reconstructions and MIPs were
obtained to evaluate the vascular anatomy.
CONTRAST:  100mL OMNIPAQUE IOHEXOL 350 MG/ML SOLN

[Series 7: thins · axial · 0.82mm/px · z∈[+108,+359]mm · 15 of 404 slices shown]
[im 23/404  lung]
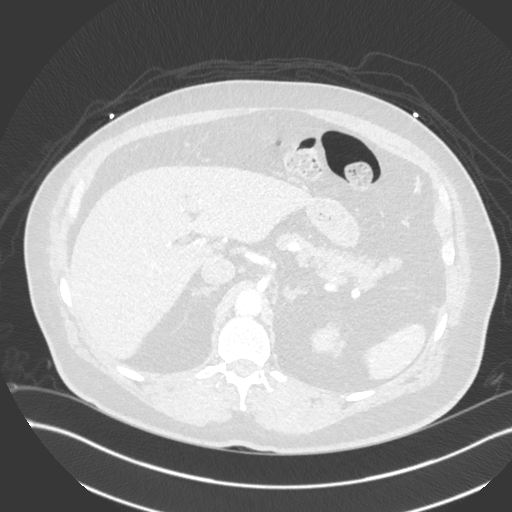
[im 45/404  soft-tissue]
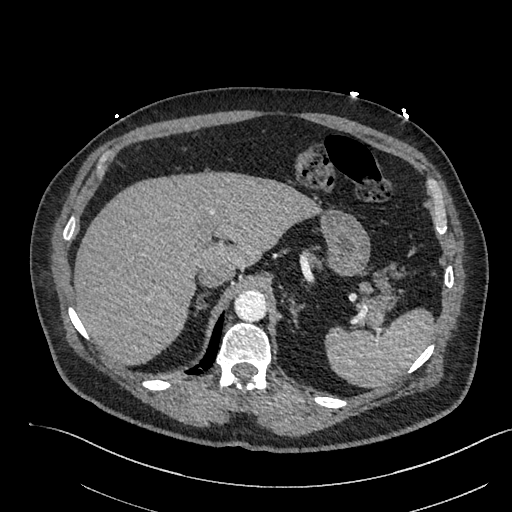
[im 68/404  lung]
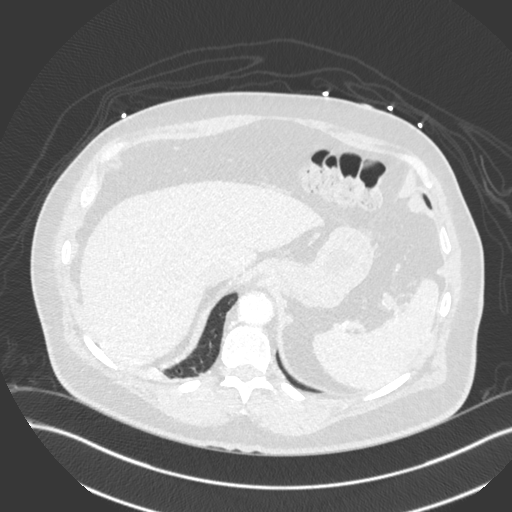
[im 90/404  soft-tissue]
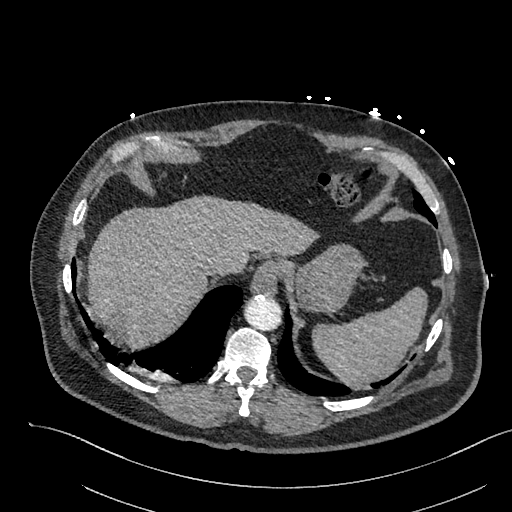
[im 135/404  lung]
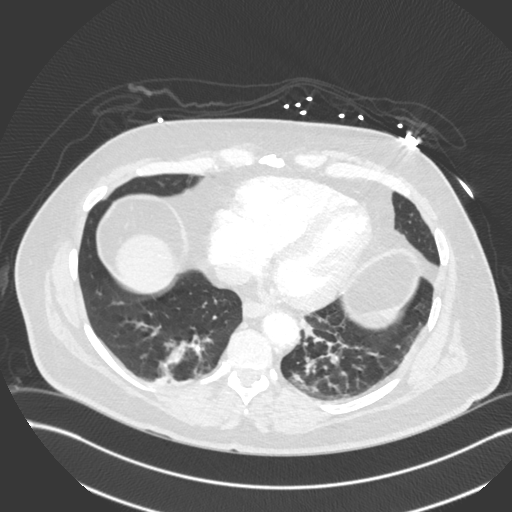
[im 157/404  soft-tissue]
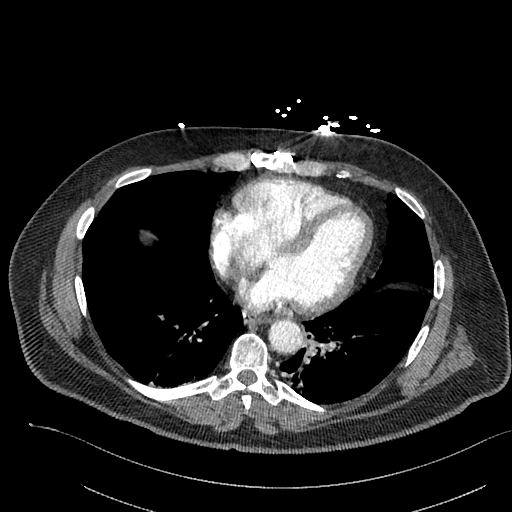
[im 180/404  lung]
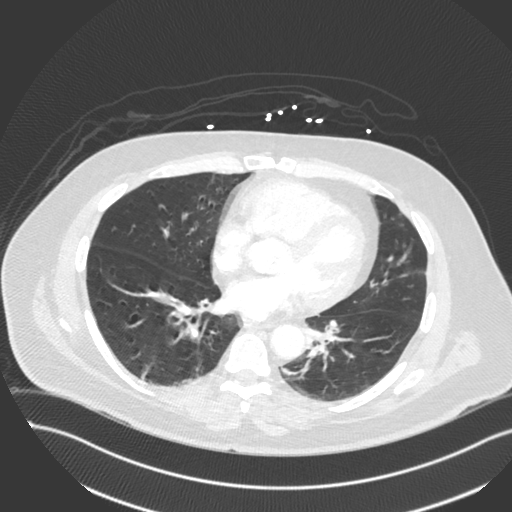
[im 202/404  soft-tissue]
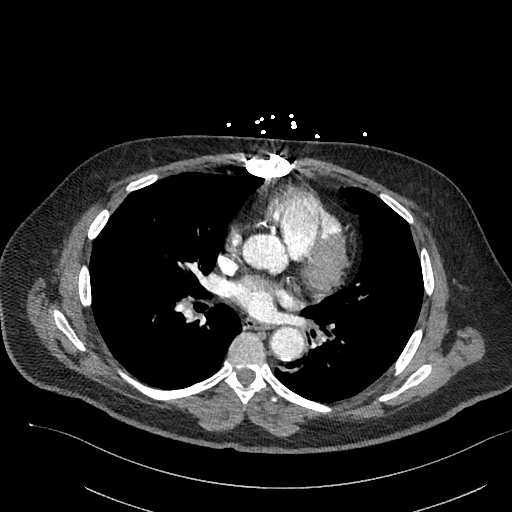
[im 224/404  lung]
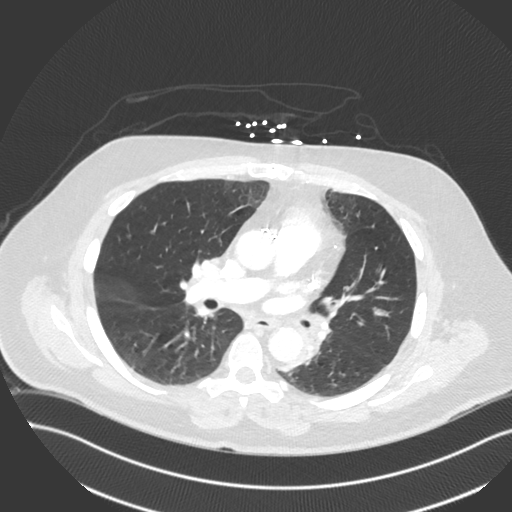
[im 247/404  soft-tissue]
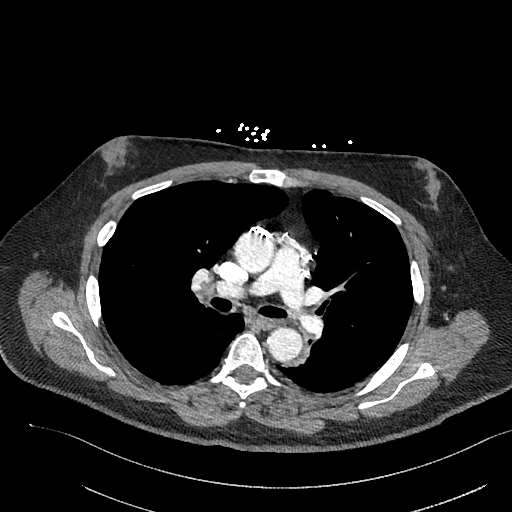
[im 269/404  lung]
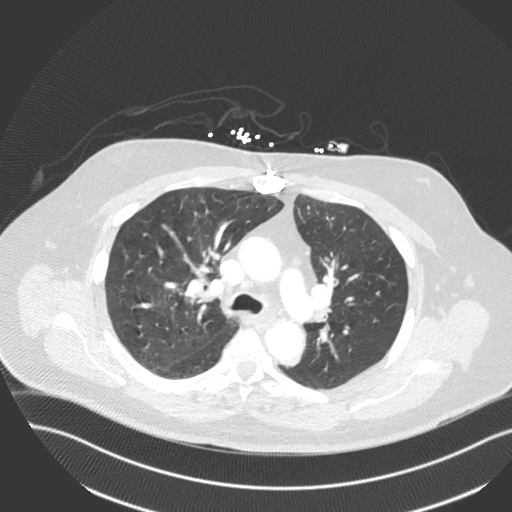
[im 314/404  soft-tissue]
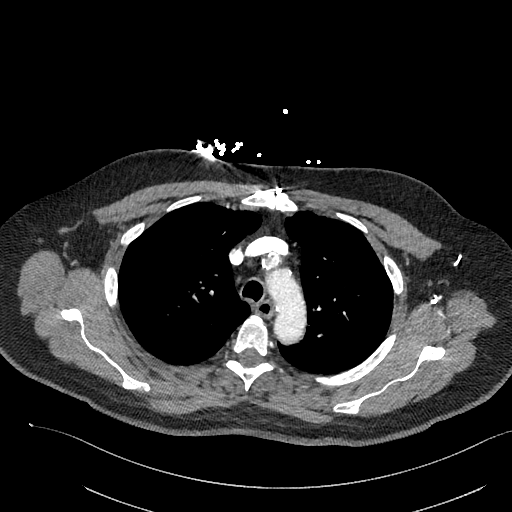
[im 336/404  lung]
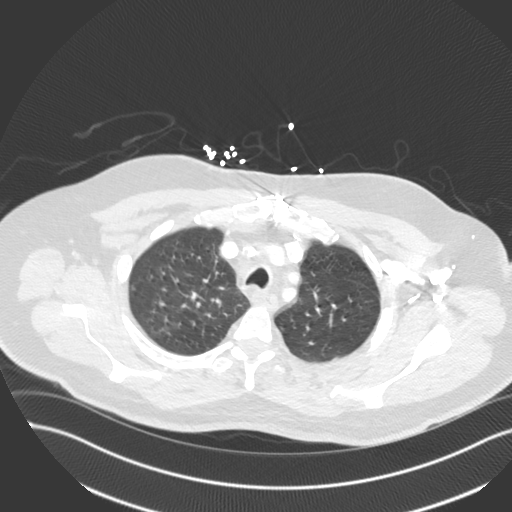
[im 359/404  soft-tissue]
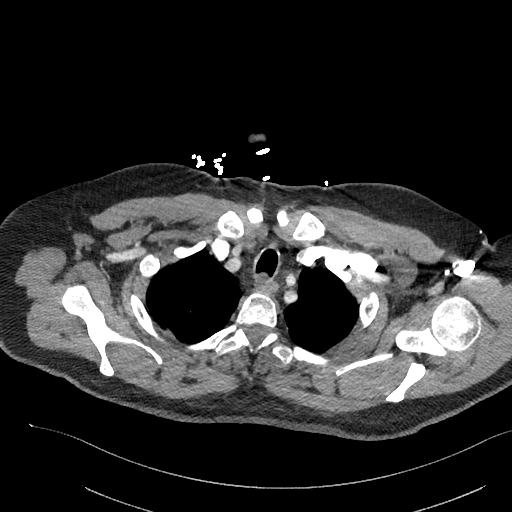
[im 381/404  lung]
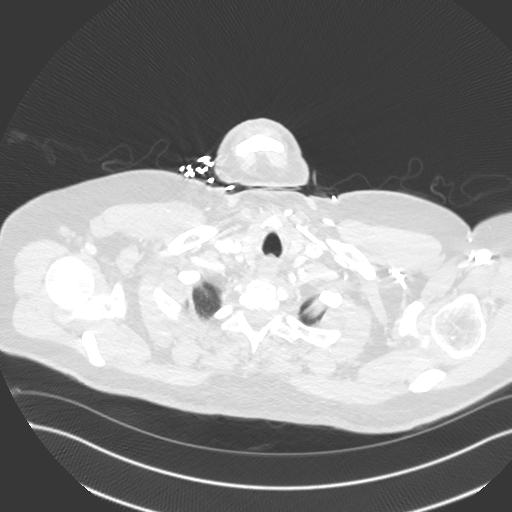

[Series 8: cor · coronal · 0.57mm/px · 2 of 122 slices shown]
[im 41/122  soft-tissue]
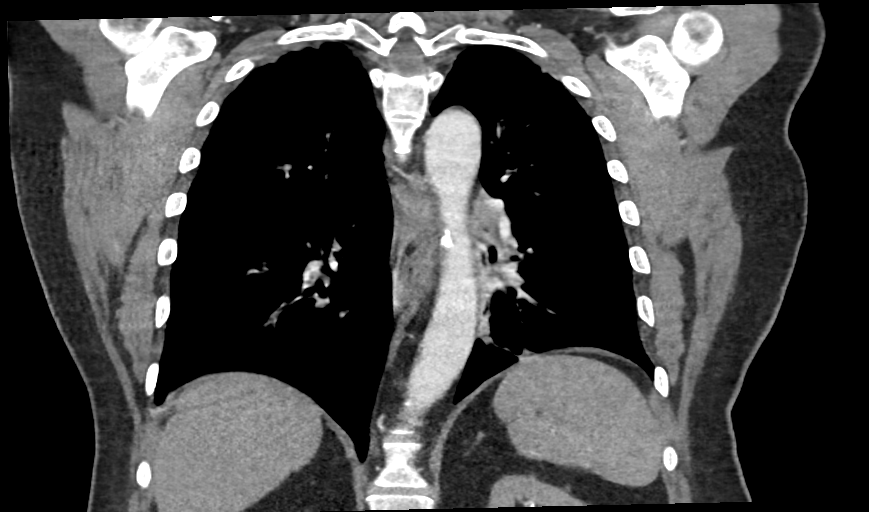
[im 81/122  soft-tissue]
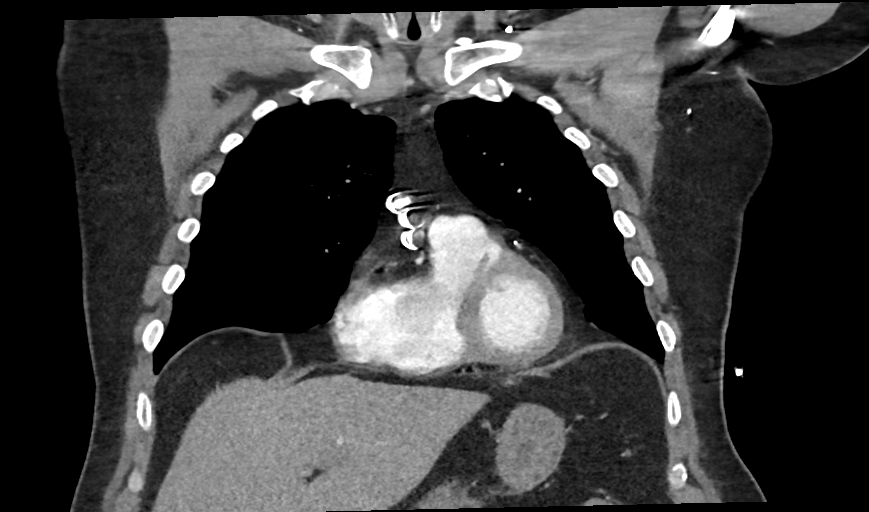

[17 of 46 positions shown; findings below may reference images not displayed]

FINDINGS: Cardiovascular: Thoracic aorta and its branches demonstrate
atherosclerotic calcifications. No aneurysmal dilatation is seen.
Changes consistent with prior coronary bypass grafting are noted.
Aortic valve calcifications are noted. No cardiac enlargement is
seen. Coronary calcifications are noted. The pulmonary artery shows
a normal branching pattern bilaterally. Filling defects are noted
within the right lower lobe pulmonary arterial branches consistent
with acute pulmonary emboli. Right heart strain is noted within
RV/LV ratio of 1.5 although this appears similar to that seen on
prior examination. It also appears greater than that expected for
the relative small volume of pulmonary emboli.

Mediastinum/Nodes: Thoracic inlet is within normal limits. Scattered
small mediastinal lymph nodes are noted likely reactive in nature. A
few small hilar lymph nodes are noted as well. Esophagus as
visualized is within normal limits.

Lungs/Pleura: Lungs are well aerated bilaterally. Mild bibasilar
atelectatic changes are seen. No focal confluent infiltrate is
noted. No sizable effusion is seen. No parenchymal nodules are
noted.

Upper Abdomen: No acute abnormality.

Musculoskeletal: Degenerative changes of the thoracic spine are
seen. No acute rib abnormality is noted.

Review of the MIP images confirms the above findings.
IMPRESSION: Mild pulmonary emboli within the right lower lobe.

Changes of right heart strain as described although they appears
similar to that seen on prior exam from 0259 and likely a
longstanding abnormality. The degree of heart strain is also greater
than that expected for the small volume of pulmonary emboli.

Bibasilar atelectatic changes.

Aortic Atherosclerosis ([id number]).

Critical Value/emergent results were called by telephone at the time
of interpretation on 05/22/2021 at [DATE] to Dr. SORIN, who
verbally acknowledged these results.

## 2021-10-21 ENCOUNTER — Other Ambulatory Visit: Payer: Medicare Other | Admitting: *Deleted

## 2021-10-21 ENCOUNTER — Other Ambulatory Visit: Payer: Self-pay

## 2021-10-21 DIAGNOSIS — I25810 Atherosclerosis of coronary artery bypass graft(s) without angina pectoris: Secondary | ICD-10-CM

## 2021-10-21 DIAGNOSIS — E782 Mixed hyperlipidemia: Secondary | ICD-10-CM

## 2021-10-21 DIAGNOSIS — Z86711 Personal history of pulmonary embolism: Secondary | ICD-10-CM

## 2021-10-21 DIAGNOSIS — I10 Essential (primary) hypertension: Secondary | ICD-10-CM

## 2021-10-21 LAB — LIPID PANEL
Chol/HDL Ratio: 2.8 ratio (ref 0.0–5.0)
Cholesterol, Total: 152 mg/dL (ref 100–199)
HDL: 55 mg/dL (ref >39)
LDL Chol Calc (NIH): 69 mg/dL (ref 0–99)
Triglycerides: 168 mg/dL — ABNORMAL HIGH (ref 0–149)
VLDL Cholesterol Cal: 28 mg/dL (ref 5–40)

## 2021-10-21 LAB — HEPATIC FUNCTION PANEL
ALT: 18 IU/L (ref 0–44)
AST: 23 IU/L (ref 0–40)
Albumin: 4.2 g/dL (ref 3.8–4.8)
Alkaline Phosphatase: 122 IU/L — ABNORMAL HIGH (ref 44–121)
Bilirubin Total: 0.2 mg/dL (ref 0.0–1.2)
Bilirubin, Direct: <0.1 mg/dL (ref 0.00–0.40)
Total Protein: 6.5 g/dL (ref 6.0–8.5)

## 2022-02-07 ENCOUNTER — Other Ambulatory Visit: Payer: Self-pay | Admitting: Cardiovascular Disease

## 2022-02-27 ENCOUNTER — Telehealth: Payer: Self-pay | Admitting: Cardiovascular Disease

## 2022-02-27 ENCOUNTER — Ambulatory Visit (INDEPENDENT_AMBULATORY_CARE_PROVIDER_SITE_OTHER): Payer: Medicare Other

## 2022-02-27 DIAGNOSIS — I493 Ventricular premature depolarization: Secondary | ICD-10-CM

## 2022-02-27 NOTE — Telephone Encounter (Signed)
Spoke with the patient who states that last night he was lying down about to go to bed and his heart started racing. He states that he did not feel any skipped beats, his heart was just pounding quickly. He did not take his heart rate. He states that the episode lasted about 5 minutes before it calmed down. He reports that he has had about 5-6 episodes of this over the past month. He states that he has been resting whenever they occur. He has not had any symptoms such as lightheadedness, dizziness, shortness of breath or chest pain. He states that he has had some trouble with his asthma this year although he has now gotten that under control. He does use Breo and albuterol inhaler and nebulizer.  Previous heart monitor showed PVC's, however patient states that this does not feel like those.  Advised that I would send to Dr. Excell Seltzer to see if we needed another heart monitor for him to wear or if he had any further recommendations. Patient verbalized understanding.

## 2022-02-27 NOTE — Telephone Encounter (Signed)
Spoke with the patient and advised that Dr. Excell Seltzer has ordered him to wear a two week heart monitor. Patient verbalized understanding.

## 2022-02-27 NOTE — Progress Notes (Unsigned)
Enrolled patient for a 14 day Zio XT  monitor to be mailed to patients home  °

## 2022-02-27 NOTE — Telephone Encounter (Signed)
Thanks - agree that a monitor is appropriate. Please order a 14 day ZIO. thanks

## 2022-02-27 NOTE — Telephone Encounter (Signed)
Patient c/o Palpitations:  High priority if patient c/o lightheadedness, shortness of breath, or chest pain  How long have you had palpitations/irregular HR/ Afib? Are you having the symptoms now? Last night  Are you currently experiencing lightheadedness, SOB or CP? No   Do you have a history of afib (atrial fibrillation) or irregular heart rhythm? Yes   Have you checked your BP or HR? (document readings if available): no   Are you experiencing any other symptoms? No; patient has asthma that contributes to SOB

## 2022-03-08 DIAGNOSIS — I493 Ventricular premature depolarization: Secondary | ICD-10-CM | POA: Diagnosis not present

## 2022-07-23 ENCOUNTER — Encounter: Payer: Self-pay | Admitting: Physician Assistant

## 2022-07-23 ENCOUNTER — Ambulatory Visit: Payer: Medicare Other | Attending: Physician Assistant | Admitting: Physician Assistant

## 2022-07-23 VITALS — BP 136/78 | HR 78 | Ht 64.0 in | Wt 203.6 lb

## 2022-07-23 DIAGNOSIS — I700 Atherosclerosis of aorta: Secondary | ICD-10-CM | POA: Insufficient documentation

## 2022-07-23 DIAGNOSIS — Z86711 Personal history of pulmonary embolism: Secondary | ICD-10-CM | POA: Diagnosis present

## 2022-07-23 DIAGNOSIS — I2510 Atherosclerotic heart disease of native coronary artery without angina pectoris: Secondary | ICD-10-CM | POA: Diagnosis not present

## 2022-07-23 DIAGNOSIS — I10 Essential (primary) hypertension: Secondary | ICD-10-CM | POA: Diagnosis not present

## 2022-07-23 DIAGNOSIS — E785 Hyperlipidemia, unspecified: Secondary | ICD-10-CM | POA: Diagnosis not present

## 2022-07-23 DIAGNOSIS — I471 Supraventricular tachycardia, unspecified: Secondary | ICD-10-CM | POA: Diagnosis present

## 2022-07-23 DIAGNOSIS — R600 Localized edema: Secondary | ICD-10-CM | POA: Diagnosis present

## 2022-07-23 DIAGNOSIS — I25810 Atherosclerosis of coronary artery bypass graft(s) without angina pectoris: Secondary | ICD-10-CM | POA: Diagnosis not present

## 2022-07-23 DIAGNOSIS — J45909 Unspecified asthma, uncomplicated: Secondary | ICD-10-CM | POA: Diagnosis present

## 2022-07-23 MED ORDER — BISOPROLOL FUMARATE 5 MG PO TABS: ORAL_TABLET | ORAL | 3 refills | Status: DC

## 2022-07-23 NOTE — Assessment & Plan Note (Signed)
LDL optimal in January 2023.  Continue Zetia 10 mg daily, Crestor 40 mg daily.  His triglycerides were somewhat elevated.  We reviewed the importance of reduced carbohydrates in his diet.  We also discussed the importance of a plant-based diet.

## 2022-07-23 NOTE — Patient Instructions (Signed)
Medication Instructions:  Your physician recommends that you continue on your current medications as directed. Please refer to the Current Medication list given to you today. It is ok to take an extra 1/2 tablet of Bisoprolol only as needed for palpitations   *If you need a refill on your cardiac medications before your next appointment, please call your pharmacy*   Lab Work: None ordered  If you have labs (blood work) drawn today and your tests are completely normal, you will receive your results only by: Justin (if you have MyChart) OR A paper copy in the mail If you have any lab test that is abnormal or we need to change your treatment, we will call you to review the results.   Testing/Procedures: None ordered   Follow-Up: At [facility], you and your health needs are our priority.  As part of our continuing mission to provide you with exceptional heart care, we have created designated Provider Care Teams.  These Care Teams include your primary Cardiologist (physician) and Advanced Practice Providers (APPs -  Physician Assistants and Nurse Practitioners) who all work together to provide you with the care you need, when you need it.  We recommend signing up for the patient portal called "MyChart".  Sign up information is provided on this After Visit Summary.  MyChart is used to connect with patients for Virtual Visits (Telemedicine).  Patients are able to view lab/test results, encounter notes, upcoming appointments, etc.  Non-urgent messages can be sent to your provider as well.   To learn more about what you can do with MyChart, go to [email or web address].    Your next appointment:   1 year(s)  The format for your next appointment:   In Person  Provider:   Sherren Mocha, MD  or Richardson Dopp, PA-C         Other Instructions   Important Information About Sugar

## 2022-07-23 NOTE — Assessment & Plan Note (Signed)
Fair control.  He did start having symptoms of an asthma attack prior to coming to the office today.  Blood pressure is usually fairly well controlled.  Continue bisoprolol 2.5 mg daily.

## 2022-07-23 NOTE — Assessment & Plan Note (Signed)
Pulmonary embolism was felt to be provoked.  He was ultimately taken off of Eliquis by primary care.

## 2022-07-23 NOTE — Assessment & Plan Note (Signed)
Continue aspirin, statin therapy 

## 2022-07-23 NOTE — Assessment & Plan Note (Signed)
He has some dependent leg edema.  We discussed compression stockings versus as needed furosemide therapy.  He prefers to use compression stockings.  If he has difficulty with controlling his swelling, he will let us know so that we can start him on some as needed furosemide.

## 2022-07-23 NOTE — Assessment & Plan Note (Signed)
He does have occasional palpitations.  Monitor in June 2023 did demonstrate a few short supraventricular runs.  We discussed potentially increasing his bisoprolol to 5 mg daily.  However, he had symptoms of lightheadedness with this dose in the past.  Therefore, I have recommended that he take bisoprolol 2.5 mg as needed for palpitations in addition to his regular dose.

## 2022-07-23 NOTE — Assessment & Plan Note (Signed)
He is wheezing on exam today and notes recent symptoms of asthma exacerbation.  I encouraged him to use his rescue inhaler once he gets home.  Follow-up with pulmonology as planned.

## 2022-07-23 NOTE — Assessment & Plan Note (Signed)
History of non-STEMI in 2008 followed by CABG.  Status post inferior STEMI in 2021 through the DES x2 to the vein graft to the OM.  He was taken off of Effient when he presented for his pulmonary embolism in 2022.  He has since been taken off of Eliquis.  As it has been more than 2 years since his last ACS/PCI, I think it is acceptable to remain on aspirin only.  He is doing well without anginal symptoms.  His electrocardiogram is unchanged.  Continue aspirin 81 mg daily, bisoprolol 2.5 mg daily, Crestor 40 mg daily.  Follow-up in 1 year.

## 2022-07-23 NOTE — Progress Notes (Signed)
Cardiology Office Note:    Date:  07/23/2022   ID:  Kevin Guzman, DOB 09/27/1951, MRN [id number]  PCP:  Bartholome Bill, [place] [facility] Cardiologist:  Sherren Mocha, MD    Referring MD: Bartholome Bill, MD   Chief Complaint:  F/u for CAD    Patient Profile: Coronary artery disease  S/p NSTEMI in 2008 >> CABG S/p inf STEMI 11/2019>>PCI:  DES x 2 to S-OM Hypertension  Hyperlipidemia  Hx of pulmonary embolism (05/2021) CT w evid of R heart strain // Echocardiogram w normal RVSF Effient DC'd >> Eliquis  Aortic atherosclerosis  Right Bundle Branch Block  Supraventricular Tachycardia  Monitor 6/23: few short runs (longest 15s) - Med Rx  Prior CV Studies: LONG TERM MONITOR (8-14 DAYS) INTERPRETATION 03/28/2022 The basic rhythm is normal sinus with a heart rate of 71 bpm.  There are a few short supraventricular runs, the longest lasting 15 seconds with a heart rate of 106 bpm.  There were no sustained arrhythmias.  There were rare PVCs noted with an overall burden of less than 1%.  No atrial fibrillation or flutter.  No high-grade AV block.  Otherwise as above.    Echocardiogram 05/22/21 EF 60-65, no RWMA, mild asymmetric LVH, G1 DD, low normal RVSF, mildly elevated PASP, RVSP 43, mild AV sclerosis   LONG TERM MONITOR (3-7 DAYS) INTERPRETATION 02/15/2020 Narrative 1. The basic rhythm is normal sinus with an average HR of 65 bpm 2. No atrial fibrillation or flutter 3. No high-grade heart block or pathologic pauses 4. There are no PVC's and rare supraventricular beats without sustained arrhythmias Benign monitor   Echocardiogram 12/07/2019 EF 60-65, Gr 1 DD, normal RVSF, trivial MR   Cardiac catheterization 12/06/2019 LM mid 50 LAD ost 100 OM1 100 RCA prox 50 L-LAD ok S-D1 ok S-OM 80, mid 100 EF 55-65 PCI: 3.5 x 26 mm Resolute Onyx DES, 4 x 26 mm Resolute Onyx DES to S-OM   Myoview 02/24/2019 EF 63, Low Risk  History of Present  Illness:   Kevin Guzman is a 71 y.o. male with the above problem list.  He was last seen in 10/22. He contacted our office in May 23 with sx's of palpitations. A Zio monitor was ordered and this demonstrated rare PVCs and a few short supraventricular runs.   He returns for f/u.  He is here alone.  Since last seen, he has done well without chest pain or significant shortness of breath.  He has not had syncope, orthopnea.  He does have leg edema.  This is fairly chronic.  It does improve with elevation.  He has used compression stockings in the past.  He did develop symptoms of and asthma exacerbation earlier today.  He has not used his rescue inhaler yet.    Past Medical History:  Diagnosis Date   Acute ST elevation myocardial infarction (STEMI) of inferior wall ([place]) 12/06/2019   Asthma    CAD (coronary artery disease)    a. 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 EF 65% at cath 8/08   Coronary artery disease involving coronary bypass graft of native heart with angina pectoris [facility]) 12/07/2019   12/06/2019: Inferior STEMI-thrombotic occlusion of SVG-OM treated with rheolytic (AngioJet) thrombectomy and 2 site DES PCI.   History of cardiac monitoring    Cardiac Monitor 02/2020: NSR, avg HR 65, no PVCs, no AF   HLD (hyperlipidemia)    HTN (hypertension)  STEMI (ST elevation myocardial infarction) ([place]) 12/06/2019   Testosterone deficiency    Current Medications: Current Meds  Medication Sig   albuterol (PROVENTIL HFA;VENTOLIN HFA) 108 (90 Base) MCG/ACT inhaler Inhale 2 puffs into the lungs every 4 (four) hours as needed for wheezing or shortness of breath.    albuterol (PROVENTIL) (2.5 MG/3ML) 0.083% nebulizer solution Take 2.5 mg by nebulization every 6 (six) hours as needed for wheezing or shortness of breath.   allopurinol (ZYLOPRIM) 300 MG tablet Take 300 mg by mouth daily.   aspirin 81 MG EC tablet Take 81 mg by mouth daily.     betamethasone dipropionate  0.05 % cream Apply 1 application topically 2 (two) times daily as needed (rash on legs).   bisoprolol (ZEBETA) 5 MG tablet Take 1/2 tablet by mouth daily, may take extra 1/2 daily only as needed for palpitations   diphenhydramine-acetaminophen (TYLENOL PM) 25-500 MG TABS tablet Take 1 tablet by mouth at bedtime as needed (sleep).   ezetimibe (ZETIA) 10 MG tablet TAKE ONE TABLET BY MOUTH DAILY   fluticasone furoate-vilanterol (BREO ELLIPTA) 100-25 MCG/INH AEPB Inhale 1 puff into the lungs as needed (SOB/wheezing).    hydrOXYzine (ATARAX/VISTARIL) 10 MG tablet Take 10-20 mg by mouth at bedtime as needed for anxiety.   Melatonin 10 MG CAPS Take 10 mg by mouth at bedtime as needed (for sleep).   nitroGLYCERIN (NITROSTAT) 0.4 MG SL tablet Place 1 tablet (0.4 mg total) under the tongue every 5 (five) minutes x 3 doses as needed for chest pain.   rosuvastatin (CRESTOR) 40 MG tablet Take 1 tablet (40 mg total) by mouth daily.   [DISCONTINUED] bisoprolol (ZEBETA) 5 MG tablet Take 0.5 tablets (2.5 mg total) by mouth daily.    Allergies:   Almond meal, Almond oil, and Ace inhibitors   Social History   Tobacco Use   Smoking status: Former   Smokeless tobacco: Former    Quit date: 10/13/1998  Vaping Use   Vaping Use: Never used  Substance Use Topics   Alcohol use: No   Drug use: No    Family Hx: The patient's family history includes Cancer in his mother. There is no history of Heart attack or Stroke.  Review of Systems  Constitutional: Negative for fever.  Respiratory:  Positive for wheezing.      EKGs/Labs/Other Test Reviewed:    EKG:  EKG is  ordered today.  The ekg ordered today demonstrates NSR, HR 78, left axis deviation, right bundle branch block, QTc 467, no change since prior tracing  Recent Labs: 10/21/2021: ALT 18   Recent Lipid Panel Recent Labs    10/21/21 1008  CHOL 152  TRIG 168*  HDL 55  LDLCALC 69     Risk Assessment/Calculations/Metrics:              Physical  Exam:    VS:  BP 136/78   Pulse 78   Ht 5\' 4"  (1.626 m)   Wt 203 lb 9.6 oz (92.4 kg)   SpO2 95%   BMI 34.95 kg/m     Wt Readings from Last 3 Encounters:  07/23/22 203 lb 9.6 oz (92.4 kg)  07/30/21 203 lb 3.2 oz (92.2 kg)  05/24/21 208 lb 1.6 oz (94.4 kg)    Constitutional:      Appearance: Healthy appearance. Not in distress.  Neck:     Vascular: JVD normal.  Pulmonary:     Effort: Pulmonary effort is normal.     Breath sounds: Diffuse  Wheezing present. No rales.  Cardiovascular:     Normal rate. Regular rhythm. Normal S1. Normal S2.      Murmurs: There is no murmur.  Edema:    Peripheral edema present.    Pretibial: trace edema of the left pretibial area and 1+ edema of the right pretibial area. Abdominal:     Palpations: Abdomen is soft.  Skin:    General: Skin is warm and dry.  Neurological:     General: No focal deficit present.     Mental Status: Alert and oriented to person, place and time.        ASSESSMENT & PLAN:   Coronary artery disease involving native coronary artery of native heart without angina pectoris History of non-STEMI in 2008 followed by CABG.  Status post inferior STEMI in 2021 through the DES x2 to the vein graft to the OM.  He was taken off of Effient when he presented for his pulmonary embolism in 2022.  He has since been taken off of Eliquis.  As it has been more than 2 years since his last ACS/PCI, I think it is acceptable to remain on aspirin only.  He is doing well without anginal symptoms.  His electrocardiogram is unchanged.  Continue aspirin 81 mg daily, bisoprolol 2.5 mg daily, Crestor 40 mg daily.  Follow-up in 1 year.  Essential hypertension Fair control.  He did start having symptoms of an asthma attack prior to coming to the office today.  Blood pressure is usually fairly well controlled.  Continue bisoprolol 2.5 mg daily.  Hyperlipidemia LDL goal <70 LDL optimal in January 2023.  Continue Zetia 10 mg daily, Crestor 40 mg daily.  His  triglycerides were somewhat elevated.  We reviewed the importance of reduced carbohydrates in his diet.  We also discussed the importance of a plant-based diet.  Aortic atherosclerosis (HCC) Continue aspirin, statin therapy.  Asthma He is wheezing on exam today and notes recent symptoms of asthma exacerbation.  I encouraged him to use his rescue inhaler once he gets home.  Follow-up with pulmonology as planned.  History of pulmonary embolism Pulmonary embolism was felt to be provoked.  He was ultimately taken off of Eliquis by primary care.  SVT (supraventricular tachycardia) He does have occasional palpitations.  Monitor in June 2023 did demonstrate a few short supraventricular runs.  We discussed potentially increasing his bisoprolol to 5 mg daily.  However, he had symptoms of lightheadedness with this dose in the past.  Therefore, I have recommended that he take bisoprolol 2.5 mg as needed for palpitations in addition to his regular dose.  Leg edema He has some dependent leg edema.  We discussed compression stockings versus as needed furosemide therapy.  He prefers to use compression stockings.  If he has difficulty with controlling his swelling, he will let us know so that we can start him on some as needed furosemide.           Dispo:  Return in about 1 year (around 07/24/2023) for Routine Follow Up, w/ Dr. Burt Knack, or Richardson Dopp, PA-C.   Medication Adjustments/Labs and Tests Ordered: Current medicines are reviewed at length with the patient today.  Concerns regarding medicines are outlined above.  Tests Ordered: Orders Placed This Encounter  Procedures   EKG 12-Lead   Medication Changes: Meds ordered this encounter  Medications   bisoprolol (ZEBETA) 5 MG tablet    Sig: Take 1/2 tablet by mouth daily, may take extra 1/2 daily only as needed for  palpitations    Dispense:  75 tablet    Refill:  3   Signed, Richardson Dopp, PA-C  07/23/2022 2:36 PM    [address], [place], [place]  [address] Phone: [phone]; Fax: [phone]

## 2022-09-24 ENCOUNTER — Telehealth: Payer: Self-pay

## 2022-09-24 ENCOUNTER — Telehealth: Payer: Self-pay | Admitting: *Deleted

## 2022-09-24 NOTE — Telephone Encounter (Signed)
   Name: Kevin Guzman  DOB: [birth year]  MRN: [id number]  Primary Cardiologist: Tonny Bollman, MD  Chart reviewed as part of pre-operative protocol coverage. Because of Seaver Machia past medical history and time since last visit, he will require a follow-up telephone visit in order to better assess preoperative cardiovascular risk.  Pre-op covering staff: - Please schedule appointment and call patient to inform them. If patient already had an upcoming appointment within acceptable timeframe, please add "pre-op clearance" to the appointment notes so provider is aware. - Please contact requesting surgeon's office via preferred method (i.e, phone, fax) to inform them of need for appointment prior to surgery.  Okay to hold ASA x 7 days per office protocol as long as no CV symptoms during the phone call.    Sharlene Dory, PA-C  09/24/2022, 11:51 AM

## 2022-09-24 NOTE — Telephone Encounter (Signed)
Pt agreeable to tele pre op appt 10/20/22 @ 2 pm. Med rec and consent are done.

## 2022-09-24 NOTE — Telephone Encounter (Signed)
Pt agreeable to tele pre op appt 10/20/22 @ 2 pm. Med rec and consent are done.     Patient Consent for Virtual Visit        Kevin Guzman has provided verbal consent on 09/24/2022 for a virtual visit (video or telephone).   CONSENT FOR VIRTUAL VISIT FOR:  Kevin Guzman  By participating in this virtual visit I agree to the following:  I hereby voluntarily request, consent and authorize [facility] and its employed or contracted physicians, physician assistants, nurse practitioners or other licensed health care professionals (the Practitioner), to provide me with telemedicine health care services (the "Services") as deemed necessary by the treating Practitioner. I acknowledge and consent to receive the Services by the Practitioner via telemedicine. I understand that the telemedicine visit will involve communicating with the Practitioner through live audiovisual communication technology and the disclosure of certain medical information by electronic transmission. I acknowledge that I have been given the opportunity to request an in-person assessment or other available alternative prior to the telemedicine visit and am voluntarily participating in the telemedicine visit.  I understand that I have the right to withhold or withdraw my consent to the use of telemedicine in the course of my care at any time, without affecting my right to future care or treatment, and that the Practitioner or I may terminate the telemedicine visit at any time. I understand that I have the right to inspect all information obtained and/or recorded in the course of the telemedicine visit and may receive copies of available information for a reasonable fee.  I understand that some of the potential risks of receiving the Services via telemedicine include:  Delay or interruption in medical evaluation due to technological equipment failure or disruption; Information transmitted may not be sufficient (e.g. poor  resolution of images) to allow for appropriate medical decision making by the Practitioner; and/or  In rare instances, security protocols could fail, causing a breach of personal health information.  Furthermore, I acknowledge that it is my responsibility to provide information about my medical history, conditions and care that is complete and accurate to the best of my ability. I acknowledge that Practitioner's advice, recommendations, and/or decision may be based on factors not within their control, such as incomplete or inaccurate data provided by me or distortions of diagnostic images or specimens that may result from electronic transmissions. I understand that the practice of medicine is not an exact science and that Practitioner makes no warranties or guarantees regarding treatment outcomes. I acknowledge that a copy of this consent can be made available to me via my patient portal [facility] MyChart), or I can request a printed copy by calling the office of [facility].    I understand that my insurance will be billed for this visit.   I have read or had this consent read to me. I understand the contents of this consent, which adequately explains the benefits and risks of the Services being provided via telemedicine.  I have been provided ample opportunity to ask questions regarding this consent and the Services and have had my questions answered to my satisfaction. I give my informed consent for the services to be provided through the use of telemedicine in my medical care

## 2022-09-24 NOTE — Telephone Encounter (Signed)
..     Pre-operative Risk Assessment    Patient Name: Kevin Guzman  DOB: 01-08-1951 MRN: [id number]      Request for Surgical Clearance    Procedure:   colonoscopy  Date of Surgery:  Clearance 11/19/22                                 Surgeon:  dr Loreta Ave Surgeon's Group or Practice Name:  [facility] Phone number:  [phone] Fax number:  [phone]   Type of Clearance Requested:   - Medical  - Pharmacy:  Hold Aspirin     Type of Anesthesia:   propofol   Additional requests/questions:    Jola Babinski   09/24/2022, 10:49 AM

## 2022-10-20 ENCOUNTER — Ambulatory Visit: Payer: Medicare PPO | Attending: Cardiology | Admitting: Physician Assistant

## 2022-10-20 DIAGNOSIS — Z01810 Encounter for preprocedural cardiovascular examination: Secondary | ICD-10-CM

## 2022-10-20 NOTE — Progress Notes (Signed)
Virtual Visit via Telephone Note   Because of Kevin Guzman co-morbid illnesses, he is at least at moderate risk for complications without adequate follow up.  This format is felt to be most appropriate for this patient at this time.  The patient did not have access to video technology/had technical difficulties with video requiring transitioning to audio format only (telephone).  All issues noted in this document were discussed and addressed.  No physical exam could be performed with this format.  Please refer to the patient's chart for his consent to telehealth for [facility].  Evaluation Performed:  Preoperative cardiovascular risk assessment _____________   Date:  10/20/2022   Patient ID:  Kevin Guzman, DOB 03-16-1951, Kevin Guzman Patient Location:  Home Provider location:   Office  Primary Care Provider:  Bartholome Bill, MD Primary Cardiologist:  Sherren Mocha, MD  Chief Complaint / Patient Profile   72 y.o. y/o male with a h/o CAD (status post NSTEMI in 2008, CABG and then status post inferior STEMI 2/21 with PCI x 2), hypertension, hyperlipidemia, history of pulmonary embolism (8/22), aortic atherosclerosis, right bundle branch block, supraventricular tachycardia (monitor 6/23 with a few short runs) who is pending colonoscopy (11/19/22) and presents today for telephonic preoperative cardiovascular risk assessment.  History of Present Illness    Kevin Guzman is a 72 y.o. male who presents via audio/video conferencing for a telehealth visit today.  Pt was last seen in cardiology clinic on 07/23/2022 by Richardson Dopp, PA.  At that time Kevin Guzman was doing well .  The patient is now pending procedure as outlined above. Since his last visit, he tells me that he just just getting over the flu. He has had a couple instances where he feels like his heart is racing, mostly at night. He was on the monitor-outcome was to watch it. 2-3 occurences Oct. He  has needing to take his extra 1/2 tab of bisoprolol. When it occurred it was the middle of the day he felt the pulse in his fingertips.   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 He only has issues with his asthma. He walks up and down stairs slowly. He cannot bend and twist. He does not mow the lawn due to asthma. He does do some swimming back in the summer time.  He scored a 5.99 METS on the DASI.  This exceeds the 4 METS minimum requirement.  Okay to hold ASA x 7 days per office protocol as long as no CV symptoms during the phone call.     Past Medical History    Past Medical History:  Diagnosis Date   Acute ST elevation myocardial infarction (STEMI) of inferior wall ([place]) 12/06/2019   Asthma    CAD (coronary artery disease)    a. 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 EF 65% at cath 8/08   Coronary artery disease involving coronary bypass graft of native heart with angina pectoris [facility]) 12/07/2019   12/06/2019: Inferior STEMI-thrombotic occlusion of SVG-OM treated with rheolytic (AngioJet) thrombectomy and 2 site DES PCI.   History of cardiac monitoring    Cardiac Monitor 02/2020: NSR, avg HR 65, no PVCs, no AF   HLD (hyperlipidemia)    HTN (hypertension)    STEMI (ST elevation myocardial infarction) ([place]) 12/06/2019   Testosterone deficiency    Past Surgical History:  Procedure Laterality Date   CORONARY ARTERY BYPASS GRAFT  2008   LIMA-LAD, SVG-diagonal, SVG-OM, SVG-ramus   CORONARY/GRAFT ACUTE MI REVASCULARIZATION N/A 12/06/2019   Procedure: Coronary/Graft Acute MI Revascularization;  Surgeon: Tonny Bollman, MD;  Location: [facility] INVASIVE CV LAB;  Service: Cardiovascular;  Laterality: N/A;   HERNIA REPAIR     LEFT HEART CATH AND CORONARY ANGIOGRAPHY N/A 12/06/2019   Procedure: LEFT HEART CATH AND CORONARY ANGIOGRAPHY;  Surgeon: Tonny Bollman, MD;  Location: [facility] INVASIVE CV LAB;  Service: Cardiovascular;  Laterality: N/A;    TEMPORARY PACEMAKER N/A 12/06/2019   Procedure: TEMPORARY PACEMAKER;  Surgeon: Tonny Bollman, MD;  Location: [facility] INVASIVE CV LAB;  Service: Cardiovascular;  Laterality: N/A;    Allergies  Allergies  Allergen Reactions   Almond Meal Anaphylaxis and Swelling   Almond Oil Anaphylaxis and Swelling   Ace Inhibitors Cough    Home Medications    Prior to Admission medications   Medication Sig Start Date End Date Taking? Authorizing Provider  albuterol (PROVENTIL HFA;VENTOLIN HFA) 108 (90 Base) MCG/ACT inhaler Inhale 2 puffs into the lungs every 4 (four) hours as needed for wheezing or shortness of breath.     [provider]  albuterol (PROVENTIL) (2.5 MG/3ML) 0.083% nebulizer solution Take 2.5 mg by nebulization every 6 (six) hours as needed for wheezing or shortness of breath.    [provider]  allopurinol (ZYLOPRIM) 300 MG tablet Take 300 mg by mouth daily.    [provider]  aspirin 81 MG EC tablet Take 81 mg by mouth daily.      [provider]  betamethasone dipropionate 0.05 % cream Apply 1 application topically 2 (two) times daily as needed (rash on legs). 11/26/20   [provider]  bisoprolol (ZEBETA) 5 MG tablet Take 1/2 tablet by mouth daily, may take extra 1/2 daily only as needed for palpitations 07/23/22   Tereso Newcomer T, PA-C  diphenhydramine-acetaminophen (TYLENOL PM) 25-500 MG TABS tablet Take 1 tablet by mouth at bedtime as needed (sleep).    [provider]  ezetimibe (ZETIA) 10 MG tablet TAKE ONE TABLET BY MOUTH DAILY 02/10/22   Tonny Bollman, MD  fluticasone furoate-vilanterol (BREO ELLIPTA) 100-25 MCG/INH AEPB Inhale 1 puff into the lungs as needed (SOB/wheezing).     [provider]  hydrOXYzine (ATARAX/VISTARIL) 10 MG tablet Take 10-20 mg by mouth at bedtime as needed for anxiety. 11/26/20   [provider]  Melatonin 10 MG CAPS Take 10 mg by mouth at bedtime as needed (for sleep).    [provider]  nitroGLYCERIN (NITROSTAT) 0.4 MG SL tablet Place 1 tablet (0.4 mg total) under the tongue every 5 (five) minutes x 3 doses as needed for chest pain. 12/08/19   Arty Baumgartner, NP  rosuvastatin (CRESTOR) 40 MG tablet Take 1 tablet (40 mg total) by mouth daily. 07/30/21 09/24/22  Beatrice Lecher, PA-C    Physical Exam    Vital Signs:  Kevin Guzman does not have vital signs available for review today.  Given telephonic nature of communication, physical exam is limited. AAOx3. NAD. Normal affect.  Speech and respirations are unlabored.  Accessory Clinical Findings    None  Assessment & Plan    1.  Preoperative Cardiovascular Risk Assessment:  Mr. Deavers perioperative risk of a major cardiac event is 0.9% according to the Revised Cardiac Risk Index (RCRI).  Therefore, he is at low risk for perioperative complications.   His functional capacity is good at 5.99 METs according to the [facility] Activity Status Index (DASI). Recommendations:  According to ACC/AHA guidelines, no further cardiovascular testing needed.  The patient may proceed to surgery at acceptable risk.   Antiplatelet and/or Anticoagulation Recommendations: Aspirin can be held for 7 days prior to his surgery.  Please resume Aspirin post operatively when it is felt to be safe from a bleeding standpoint.    The patient was advised that if he develops new symptoms prior to surgery to contact our office to arrange for a follow-up visit, and he verbalized understanding.   A copy of this note will be routed to requesting surgeon.  Time:   Today, I have spent 20 minutes with the patient with telehealth technology discussing medical history, symptoms, and management plan.     Sharlene Dory, PA-C  10/20/2022, 8:32 AM

## 2023-01-05 ENCOUNTER — Other Ambulatory Visit: Payer: Self-pay | Admitting: Cardiovascular Disease

## 2023-02-13 ENCOUNTER — Other Ambulatory Visit: Payer: Self-pay | Admitting: Physician Assistant

## 2023-07-16 ENCOUNTER — Encounter: Payer: Self-pay | Admitting: Cardiovascular Disease

## 2023-07-16 ENCOUNTER — Ambulatory Visit: Payer: Medicare PPO | Attending: Cardiovascular Disease | Admitting: Cardiovascular Disease

## 2023-07-16 VITALS — BP 102/60 | HR 64 | Ht 64.0 in | Wt 208.0 lb

## 2023-07-16 DIAGNOSIS — I10 Essential (primary) hypertension: Secondary | ICD-10-CM

## 2023-07-16 DIAGNOSIS — Z09 Encounter for follow-up examination after completed treatment for conditions other than malignant neoplasm: Secondary | ICD-10-CM

## 2023-07-16 DIAGNOSIS — E782 Mixed hyperlipidemia: Secondary | ICD-10-CM | POA: Diagnosis not present

## 2023-07-16 DIAGNOSIS — I25708 Atherosclerosis of coronary artery bypass graft(s), unspecified, with other forms of angina pectoris: Secondary | ICD-10-CM | POA: Diagnosis not present

## 2023-07-16 NOTE — Progress Notes (Signed)
Cardiology Office Note:    Date:  07/16/2023   ID:  Kevin Guzman, DOB 10/26/1950, MRN [id number]  PCP:  Verlon Au, MD   [facility] Providers Cardiologist:  Tonny Bollman, MD     Referring MD: Verlon Au, MD   Chief Complaint  Patient presents with   Coronary Artery Disease    History of Present Illness:    Kevin Guzman is a 72 y.o. male with a hx of:  Coronary artery disease  S/p NSTEMI in 2008 >> CABG S/p inf STEMI 11/2019>>PCI:  DES x 2 to S-OM Hypertension  Hyperlipidemia  Hx of pulmonary embolism (05/2021) CT w evid of R heart strain // Echocardiogram w normal RVSF Effient DC'd >> Eliquis  Aortic atherosclerosis  Right Bundle Branch Block  Supraventricular Tachycardia  Monitor 6/23: few short runs (longest 15s) - Med Rx  The patient is here alone today. He reports that his asthma is currently controlled. He admits to episodic chest tightness, but attributes this to 'heartburn.' He reports that it is 'aciditic' and responds to heartburn medication. He is exercising consistently 2 days/week and has no exertional chest pain or pressure. He has occasional palpitations and is treated with bisoprolol. No edema, orthopnea, or PND.   Past Medical History:  Diagnosis Date   Acute ST elevation myocardial infarction (STEMI) of inferior wall (HCC) 12/06/2019   Asthma    CAD (coronary artery disease)    a. 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 EF 65% at cath 8/08   Coronary artery disease involving coronary bypass graft of native heart with angina pectoris [facility]) 12/07/2019   12/06/2019: Inferior STEMI-thrombotic occlusion of SVG-OM treated with rheolytic (AngioJet) thrombectomy and 2 site DES PCI.   History of cardiac monitoring    Cardiac Monitor 02/2020: NSR, avg HR 65, no PVCs, no AF   HLD (hyperlipidemia)    HTN (hypertension)    STEMI (ST elevation myocardial infarction) (HCC) 12/06/2019   Testosterone  deficiency     Past Surgical History:  Procedure Laterality Date   CORONARY ARTERY BYPASS GRAFT  2008   LIMA-LAD, SVG-diagonal, SVG-OM, SVG-ramus   CORONARY/GRAFT ACUTE MI REVASCULARIZATION N/A 12/06/2019   Procedure: Coronary/Graft Acute MI Revascularization;  Surgeon: Tonny Bollman, MD;  Location: [facility] INVASIVE CV LAB;  Service: Cardiovascular;  Laterality: N/A;   HERNIA REPAIR     LEFT HEART CATH AND CORONARY ANGIOGRAPHY N/A 12/06/2019   Procedure: LEFT HEART CATH AND CORONARY ANGIOGRAPHY;  Surgeon: Tonny Bollman, MD;  Location: [facility] INVASIVE CV LAB;  Service: Cardiovascular;  Laterality: N/A;   TEMPORARY PACEMAKER N/A 12/06/2019   Procedure: TEMPORARY PACEMAKER;  Surgeon: Tonny Bollman, MD;  Location: [facility] INVASIVE CV LAB;  Service: Cardiovascular;  Laterality: N/A;    Current Medications: Current Meds  Medication Sig   albuterol (PROVENTIL HFA;VENTOLIN HFA) 108 (90 Base) MCG/ACT inhaler Inhale 2 puffs into the lungs every 4 (four) hours as needed for wheezing or shortness of breath.    albuterol (PROVENTIL) (2.5 MG/3ML) 0.083% nebulizer solution Take 2.5 mg by nebulization every 6 (six) hours as needed for wheezing or shortness of breath.   allopurinol (ZYLOPRIM) 300 MG tablet Take 300 mg by mouth daily.   aspirin 81 MG EC tablet Take 81 mg by mouth daily.     betamethasone dipropionate 0.05 % cream Apply 1 application topically 2 (two) times daily as needed (rash on legs).   bisoprolol (ZEBETA) 5 MG tablet Take 1/2 tablet by  mouth daily, may take extra 1/2 daily only as needed for palpitations   ezetimibe (ZETIA) 10 MG tablet TAKE ONE TABLET BY MOUTH DAILY   fluticasone furoate-vilanterol (BREO ELLIPTA) 100-25 MCG/INH AEPB Inhale 1 puff into the lungs as needed (SOB/wheezing).    hydrOXYzine (ATARAX/VISTARIL) 10 MG tablet Take 10-20 mg by mouth at bedtime as needed for anxiety.   nitroGLYCERIN (NITROSTAT) 0.4 MG SL tablet Place 1 tablet (0.4 mg total) under the tongue every 5 (five)  minutes x 3 doses as needed for chest pain.   rosuvastatin (CRESTOR) 40 MG tablet TAKE 1 TABLET BY MOUTH EVERY DAY     Allergies:   Almond mea, Almond oil, and Ace inhibitors   Social History   Socioeconomic History   Marital status: Married    Spouse name: Not on file   Number of children: Not on file   Years of education: 14   Highest education level: Associate degree: academic program  Occupational History   Not on file  Tobacco Use   Smoking status: Former   Smokeless tobacco: Former    Quit date: 10/13/1998  Vaping Use   Vaping status: Never Used  Substance and Sexual Activity   Alcohol use: No   Drug use: No   Sexual activity: Not on file  Other Topics Concern   Not on file  Social History Narrative   Not on file   Social Determinants of Health   Financial Resource Strain: Not on file  Food Insecurity: Not on file  Transportation Needs: Not on file  Physical Activity: Not on file  Stress: Not on file  Social Connections: Not on file     Family History: The patient's family history includes Cancer in his mother. There is no history of Heart attack or Stroke.  ROS:   Please see the history of present illness.    All other systems reviewed and are negative.  EKGs/Labs/Other Studies Reviewed:    The following studies were reviewed today: EKG Interpretation Date/Time:  Thursday July 16 2023 09:18:53 EDT Ventricular Rate:  64 PR Interval:  162 QRS Duration:  154 QT Interval:  448 QTC Calculation: 462 R Axis:   -44  Text Interpretation: Normal sinus rhythm Left axis deviation Right bundle branch block When compared with ECG of 21-May-2021 19:26, No significant change was found Confirmed by Tonny Bollman [phone]) on 07/16/2023 9:23:37 AM    Recent Labs: No results found for requested labs within last 365 days.  Recent Lipid Panel    Component Value Date/Time   CHOL 152 10/21/2021 1008   TRIG 168 (H) 10/21/2021 1008   TRIG 199 (H) 08/18/2006 1507    HDL 55 10/21/2021 1008   CHOLHDL 2.8 10/21/2021 1008   CHOLHDL 3.2 12/07/2019 0242   VLDL 23 12/07/2019 0242   LDLCALC 69 10/21/2021 1008   LDLDIRECT 132.0 06/08/2015 1732     Risk Assessment/Calculations:                Physical Exam:    VS:  BP 102/60   Pulse 64   Ht 5\' 4"  (1.626 m)   Wt 208 lb (94.3 kg)   SpO2 98%   BMI 35.70 kg/m     Wt Readings from Last 3 Encounters:  07/16/23 208 lb (94.3 kg)  07/23/22 203 lb 9.6 oz (92.4 kg)  07/30/21 203 lb 3.2 oz (92.2 kg)     GEN:  Well nourished, well developed in no acute distress HEENT: Normal NECK: No JVD; No  carotid bruits LYMPHATICS: No lymphadenopathy CARDIAC: RRR, no murmurs, rubs, gallops RESPIRATORY:  Clear to auscultation without rales, wheezing or rhonchi  ABDOMEN: Soft, non-tender, non-distended MUSCULOSKELETAL:  No edema; No deformity  SKIN: Warm and dry NEUROLOGIC:  Alert and oriented x 3 PSYCHIATRIC:  Normal affect   ASSESSMENT:    1. Mixed hyperlipidemia   2. 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 Essential hypertension   4. Follow-up exam    PLAN:    In order of problems listed above:  Treated with Zetia and rosuvastatin.  Increasing his exercise program and working on diet.  LDL cholesterol recently checked by his PCP is reviewed and it is 76 (at goal). Patient with chest discomfort, typical and atypical features.  Recommend a Lexiscan Myoview for further evaluation.  Continue aspirin for antiplatelet therapy and a high intensity statin drug.  Continue low-dose beta-blockade with bisoprolol. Blood pressure under ideal control on bisoprolol. Labs from PCP reviewed.  Appears clinically stable, but will check a Lexiscan Myoview to evaluate his chest discomfort.  No med changes made today.  I will see him back in 1 year for follow-up evaluation.           Medication Adjustments/Labs and Tests Ordered: Current medicines are reviewed at  length with the patient today.  Concerns regarding medicines are outlined above.  Orders Placed This Encounter  Procedures   MYOCARDIAL PERFUSION IMAGING   EKG 12-Lead   No orders of the defined types were placed in this encounter.   Patient Instructions  Medication Instructions:  Your physician recommends that you continue on your current medications as directed. Please refer to the Current Medication list given to you today.  *If you need a refill on your cardiac medications before your next appointment, please call your pharmacy*  Lab Work: NONE If you have labs (blood work) drawn today and your tests are completely normal, you will receive your results only by: MyChart Message (if you have MyChart) OR A paper copy in the mail If you have any lab test that is abnormal or we need to change your treatment, we will call you to review the results.  Testing/Procedures: Lexiscan Myoview stress test Your physician has requested that you have a lexiscan myoview. For further information please visit [email or web address] Please follow instruction sheet, as given.  Follow-Up: At [facility], you and your health needs are our priority.  As part of our continuing mission to provide you with exceptional heart care, we have created designated Provider Care Teams.  These Care Teams include your primary Cardiologist (physician) and Advanced Practice Providers (APPs -  Physician Assistants and Nurse Practitioners) who all work together to provide you with the care you need, when you need it.  Your next appointment:   1 year(s)  Provider:   Tonny Bollman, MD       Other Instructions See separate sheet for instructions   Signed, Tonny Bollman, MD  07/16/2023 1:19 PM    [facility]

## 2023-07-16 NOTE — Patient Instructions (Signed)
Medication Instructions:  Your physician recommends that you continue on your current medications as directed. Please refer to the Current Medication list given to you today.  *If you need a refill on your cardiac medications before your next appointment, please call your pharmacy*  Lab Work: NONE If you have labs (blood work) drawn today and your tests are completely normal, you will receive your results only by: MyChart Message (if you have MyChart) OR A paper copy in the mail If you have any lab test that is abnormal or we need to change your treatment, we will call you to review the results.  Testing/Procedures: Lexiscan Myoview stress test Your physician has requested that you have a lexiscan myoview. For further information please visit [email or web address] Please follow instruction sheet, as given.  Follow-Up: At [facility], you and your health needs are our priority.  As part of our continuing mission to provide you with exceptional heart care, we have created designated Provider Care Teams.  These Care Teams include your primary Cardiologist (physician) and Advanced Practice Providers (APPs -  Physician Assistants and Nurse Practitioners) who all work together to provide you with the care you need, when you need it.  Your next appointment:   1 year(s)  Provider:   Tonny Bollman, MD       Other Instructions See separate sheet for instructions

## 2023-07-22 ENCOUNTER — Telehealth (HOSPITAL_COMMUNITY): Payer: Self-pay | Admitting: *Deleted

## 2023-07-22 NOTE — Telephone Encounter (Signed)
Patient given detailed instructions per Myocardial Perfusion Study Information Sheet for the test on 07/23/2023 at 10:45. Patient notified to arrive 15 minutes early and that it is imperative to arrive on time for appointment to keep from having the test rescheduled.  If you need to cancel or reschedule your appointment, please call the office within 24 hours of your appointment. . Patient verbalized understanding.Daneil Dolin

## 2023-07-23 ENCOUNTER — Ambulatory Visit (HOSPITAL_COMMUNITY): Payer: Medicare PPO | Attending: Cardiovascular Disease

## 2023-07-23 DIAGNOSIS — I25708 Atherosclerosis of coronary artery bypass graft(s), unspecified, with other forms of angina pectoris: Secondary | ICD-10-CM | POA: Insufficient documentation

## 2023-07-23 LAB — MYOCARDIAL PERFUSION IMAGING
Angina Index: 0
Base ST Depression (mm): 0 mm
LV dias vol: 77 mL (ref 62–150)
LV sys vol: 28 mL
Nuc Stress EF: 64 %
Peak HR: 86 {beats}/min
Rest HR: 55 {beats}/min
Rest Nuclear Isotope Dose: 9.6 mCi
SDS: 2
SRS: 0
SSS: 2
ST Depression (mm): 0 mm
Stress Nuclear Isotope Dose: 32.3 mCi
TID: 1.07

## 2023-07-23 MED ORDER — REGADENOSON 0.4 MG/5ML IV SOLN
0.4000 mg | Freq: Once | INTRAVENOUS | Status: AC
  Administered 2023-07-23: 0.4 mg via INTRAVENOUS

## 2023-07-23 MED ORDER — TECHNETIUM TC 99M TETROFOSMIN IV KIT
32.3000 | PACK | Freq: Once | INTRAVENOUS | Status: AC | PRN
  Administered 2023-07-23: 32.3 via INTRAVENOUS

## 2023-07-23 MED ORDER — TECHNETIUM TC 99M TETROFOSMIN IV KIT
9.6000 | PACK | Freq: Once | INTRAVENOUS | Status: AC | PRN
  Administered 2023-07-23: 9.6 via INTRAVENOUS

## 2023-09-09 ENCOUNTER — Other Ambulatory Visit: Payer: Self-pay | Admitting: Physician Assistant

## 2024-07-22 ENCOUNTER — Other Ambulatory Visit: Payer: Self-pay | Admitting: Physician Assistant

## 2024-09-15 ENCOUNTER — Other Ambulatory Visit: Payer: Self-pay | Admitting: Cardiovascular Disease
